# Patient Record
Sex: Female | Born: 1950 | Race: White | Hispanic: No | Smoking: Former smoker
Health system: Southern US, Community
[De-identification: ages and names within clinical notes are randomized; demographics above are authoritative.]

## PROBLEM LIST (undated history)

## (undated) DIAGNOSIS — E785 Hyperlipidemia, unspecified: Secondary | ICD-10-CM

## (undated) DIAGNOSIS — F419 Anxiety disorder, unspecified: Secondary | ICD-10-CM

## (undated) DIAGNOSIS — T7840XA Allergy, unspecified, initial encounter: Secondary | ICD-10-CM

## (undated) DIAGNOSIS — I452 Bifascicular block: Secondary | ICD-10-CM

## (undated) DIAGNOSIS — J45909 Unspecified asthma, uncomplicated: Secondary | ICD-10-CM

## (undated) DIAGNOSIS — J449 Chronic obstructive pulmonary disease, unspecified: Secondary | ICD-10-CM

## (undated) DIAGNOSIS — R7303 Prediabetes: Secondary | ICD-10-CM

## (undated) DIAGNOSIS — F32A Depression, unspecified: Secondary | ICD-10-CM

## (undated) DIAGNOSIS — Z87891 Personal history of nicotine dependence: Secondary | ICD-10-CM

## (undated) DIAGNOSIS — I480 Paroxysmal atrial fibrillation: Secondary | ICD-10-CM

## (undated) DIAGNOSIS — R5383 Other fatigue: Secondary | ICD-10-CM

## (undated) DIAGNOSIS — F329 Major depressive disorder, single episode, unspecified: Secondary | ICD-10-CM

## (undated) DIAGNOSIS — M419 Scoliosis, unspecified: Secondary | ICD-10-CM

## (undated) DIAGNOSIS — I2510 Atherosclerotic heart disease of native coronary artery without angina pectoris: Secondary | ICD-10-CM

## (undated) DIAGNOSIS — E079 Disorder of thyroid, unspecified: Secondary | ICD-10-CM

## (undated) HISTORY — PX: OTHER SURGICAL HISTORY: SHX169

## (undated) HISTORY — DX: Bifascicular block: I45.2

## (undated) HISTORY — DX: Depression, unspecified: F32.A

## (undated) HISTORY — DX: Anxiety disorder, unspecified: F41.9

## (undated) HISTORY — PX: APPENDECTOMY: SHX54

## (undated) HISTORY — PX: OVARIAN CYST REMOVAL: SHX89

## (undated) HISTORY — PX: TONSILECTOMY, ADENOIDECTOMY, BILATERAL MYRINGOTOMY AND TUBES: SHX2538

## (undated) HISTORY — DX: Unspecified asthma, uncomplicated: J45.909

## (undated) HISTORY — DX: Prediabetes: R73.03

## (undated) HISTORY — DX: Major depressive disorder, single episode, unspecified: F32.9

## (undated) HISTORY — DX: Scoliosis, unspecified: M41.9

## (undated) HISTORY — DX: Chronic obstructive pulmonary disease, unspecified: J44.9

## (undated) HISTORY — DX: Allergy, unspecified, initial encounter: T78.40XA

## (undated) HISTORY — DX: Disorder of thyroid, unspecified: E07.9

## (undated) HISTORY — DX: Personal history of nicotine dependence: Z87.891

## (undated) HISTORY — DX: Atherosclerotic heart disease of native coronary artery without angina pectoris: I25.10

## (undated) HISTORY — DX: Other fatigue: R53.83

## (undated) HISTORY — DX: Paroxysmal atrial fibrillation: I48.0

## (undated) HISTORY — DX: Hyperlipidemia, unspecified: E78.5

## (undated) HISTORY — DX: Hypercalcemia: E83.52

---

## 1998-02-07 ENCOUNTER — Ambulatory Visit (HOSPITAL_COMMUNITY): Admission: RE | Admit: 1998-02-07 | Discharge: 1998-02-07 | Payer: Self-pay | Admitting: Gynecology

## 1998-08-27 ENCOUNTER — Other Ambulatory Visit: Admission: RE | Admit: 1998-08-27 | Discharge: 1998-08-27 | Payer: Self-pay | Admitting: Gynecology

## 1998-12-27 ENCOUNTER — Other Ambulatory Visit: Admission: RE | Admit: 1998-12-27 | Discharge: 1998-12-27 | Payer: Self-pay | Admitting: Gynecology

## 1999-04-02 ENCOUNTER — Other Ambulatory Visit: Admission: RE | Admit: 1999-04-02 | Discharge: 1999-04-02 | Payer: Self-pay | Admitting: Gynecology

## 1999-09-26 ENCOUNTER — Other Ambulatory Visit: Admission: RE | Admit: 1999-09-26 | Discharge: 1999-09-26 | Payer: Self-pay | Admitting: Gynecology

## 2000-01-23 ENCOUNTER — Ambulatory Visit (HOSPITAL_COMMUNITY): Admission: RE | Admit: 2000-01-23 | Discharge: 2000-01-23 | Payer: Self-pay | Admitting: Gynecology

## 2000-01-23 ENCOUNTER — Encounter: Payer: Self-pay | Admitting: Gynecology

## 2000-10-05 ENCOUNTER — Other Ambulatory Visit: Admission: RE | Admit: 2000-10-05 | Discharge: 2000-10-05 | Payer: Self-pay | Admitting: Gynecology

## 2000-10-19 ENCOUNTER — Other Ambulatory Visit: Admission: RE | Admit: 2000-10-19 | Discharge: 2000-10-19 | Payer: Self-pay | Admitting: Gynecology

## 2000-10-19 ENCOUNTER — Encounter (INDEPENDENT_AMBULATORY_CARE_PROVIDER_SITE_OTHER): Payer: Self-pay | Admitting: Specialist

## 2000-12-04 ENCOUNTER — Encounter (INDEPENDENT_AMBULATORY_CARE_PROVIDER_SITE_OTHER): Payer: Self-pay | Admitting: Specialist

## 2000-12-04 ENCOUNTER — Other Ambulatory Visit: Admission: RE | Admit: 2000-12-04 | Discharge: 2000-12-04 | Payer: Self-pay | Admitting: Gynecology

## 2001-02-02 ENCOUNTER — Ambulatory Visit (HOSPITAL_COMMUNITY): Admission: RE | Admit: 2001-02-02 | Discharge: 2001-02-02 | Payer: Self-pay | Admitting: Gynecology

## 2001-02-02 ENCOUNTER — Encounter: Payer: Self-pay | Admitting: Gynecology

## 2001-10-11 ENCOUNTER — Other Ambulatory Visit: Admission: RE | Admit: 2001-10-11 | Discharge: 2001-10-11 | Payer: Self-pay | Admitting: Gynecology

## 2002-05-13 ENCOUNTER — Encounter: Admission: RE | Admit: 2002-05-13 | Discharge: 2002-05-13 | Payer: Self-pay | Admitting: Gynecology

## 2002-05-13 ENCOUNTER — Encounter: Payer: Self-pay | Admitting: Gynecology

## 2002-05-19 ENCOUNTER — Encounter: Payer: Self-pay | Admitting: Gynecology

## 2002-05-19 ENCOUNTER — Encounter: Admission: RE | Admit: 2002-05-19 | Discharge: 2002-05-19 | Payer: Self-pay | Admitting: Gynecology

## 2002-11-07 ENCOUNTER — Other Ambulatory Visit: Admission: RE | Admit: 2002-11-07 | Discharge: 2002-11-07 | Payer: Self-pay | Admitting: Gynecology

## 2003-07-28 ENCOUNTER — Encounter: Payer: Self-pay | Admitting: Gynecology

## 2003-07-28 ENCOUNTER — Ambulatory Visit (HOSPITAL_COMMUNITY): Admission: RE | Admit: 2003-07-28 | Discharge: 2003-07-28 | Payer: Self-pay | Admitting: Gynecology

## 2004-02-05 ENCOUNTER — Other Ambulatory Visit: Admission: RE | Admit: 2004-02-05 | Discharge: 2004-02-05 | Payer: Self-pay | Admitting: Gynecology

## 2004-09-03 ENCOUNTER — Ambulatory Visit (HOSPITAL_COMMUNITY): Admission: RE | Admit: 2004-09-03 | Discharge: 2004-09-03 | Payer: Self-pay | Admitting: Gynecology

## 2005-01-28 ENCOUNTER — Encounter: Admission: RE | Admit: 2005-01-28 | Discharge: 2005-04-28 | Payer: Self-pay | Admitting: Internal Medicine

## 2005-02-19 ENCOUNTER — Other Ambulatory Visit: Admission: RE | Admit: 2005-02-19 | Discharge: 2005-02-19 | Payer: Self-pay | Admitting: Gynecology

## 2005-12-10 ENCOUNTER — Ambulatory Visit (HOSPITAL_COMMUNITY): Admission: RE | Admit: 2005-12-10 | Discharge: 2005-12-10 | Payer: Self-pay | Admitting: Gynecology

## 2006-05-13 ENCOUNTER — Other Ambulatory Visit: Admission: RE | Admit: 2006-05-13 | Discharge: 2006-05-13 | Payer: Self-pay | Admitting: Gynecology

## 2006-07-22 ENCOUNTER — Ambulatory Visit (HOSPITAL_COMMUNITY): Admission: RE | Admit: 2006-07-22 | Discharge: 2006-07-22 | Payer: Self-pay | Admitting: Internal Medicine

## 2006-12-31 ENCOUNTER — Ambulatory Visit (HOSPITAL_COMMUNITY): Admission: RE | Admit: 2006-12-31 | Discharge: 2006-12-31 | Payer: Self-pay | Admitting: Gynecology

## 2007-05-19 ENCOUNTER — Other Ambulatory Visit: Admission: RE | Admit: 2007-05-19 | Discharge: 2007-05-19 | Payer: Self-pay | Admitting: Gynecology

## 2007-08-24 ENCOUNTER — Ambulatory Visit: Payer: Self-pay | Admitting: Gastroenterology

## 2007-09-07 ENCOUNTER — Encounter: Payer: Self-pay | Admitting: Gastroenterology

## 2007-09-07 ENCOUNTER — Ambulatory Visit: Payer: Self-pay | Admitting: Gastroenterology

## 2008-01-26 ENCOUNTER — Ambulatory Visit (HOSPITAL_COMMUNITY): Admission: RE | Admit: 2008-01-26 | Discharge: 2008-01-26 | Payer: Self-pay | Admitting: Gynecology

## 2008-06-01 ENCOUNTER — Encounter: Admission: RE | Admit: 2008-06-01 | Discharge: 2008-06-01 | Payer: Self-pay | Admitting: Surgery

## 2008-06-28 ENCOUNTER — Other Ambulatory Visit: Admission: RE | Admit: 2008-06-28 | Discharge: 2008-06-28 | Payer: Self-pay | Admitting: Gynecology

## 2008-07-01 ENCOUNTER — Encounter: Admission: RE | Admit: 2008-07-01 | Discharge: 2008-07-01 | Payer: Self-pay | Admitting: Surgery

## 2008-12-08 ENCOUNTER — Ambulatory Visit (HOSPITAL_COMMUNITY): Admission: RE | Admit: 2008-12-08 | Discharge: 2008-12-08 | Payer: Self-pay | Admitting: Internal Medicine

## 2009-02-20 ENCOUNTER — Ambulatory Visit (HOSPITAL_COMMUNITY): Admission: RE | Admit: 2009-02-20 | Discharge: 2009-02-20 | Payer: Self-pay | Admitting: Gynecology

## 2010-02-22 ENCOUNTER — Ambulatory Visit (HOSPITAL_COMMUNITY): Admission: RE | Admit: 2010-02-22 | Discharge: 2010-02-22 | Payer: Self-pay | Admitting: Gynecology

## 2010-05-07 ENCOUNTER — Encounter: Admission: RE | Admit: 2010-05-07 | Discharge: 2010-05-07 | Payer: Self-pay | Admitting: Internal Medicine

## 2010-10-27 ENCOUNTER — Encounter: Payer: Self-pay | Admitting: Gynecology

## 2010-10-28 ENCOUNTER — Encounter: Payer: Self-pay | Admitting: Surgery

## 2010-12-12 ENCOUNTER — Encounter: Payer: Self-pay | Admitting: Cardiovascular Disease

## 2010-12-13 ENCOUNTER — Encounter: Payer: Self-pay | Admitting: Cardiovascular Disease

## 2010-12-18 LAB — HM DEXA SCAN

## 2010-12-24 ENCOUNTER — Ambulatory Visit: Payer: Self-pay | Admitting: Cardiology

## 2011-02-04 ENCOUNTER — Ambulatory Visit (HOSPITAL_COMMUNITY): Payer: BC Managed Care – PPO | Attending: Cardiovascular Disease | Admitting: Radiology

## 2011-02-04 ENCOUNTER — Encounter: Payer: Self-pay | Admitting: Cardiovascular Disease

## 2011-02-04 ENCOUNTER — Ambulatory Visit (INDEPENDENT_AMBULATORY_CARE_PROVIDER_SITE_OTHER): Payer: BC Managed Care – PPO | Admitting: Cardiovascular Disease

## 2011-02-04 DIAGNOSIS — F17200 Nicotine dependence, unspecified, uncomplicated: Secondary | ICD-10-CM | POA: Insufficient documentation

## 2011-02-04 DIAGNOSIS — I4510 Unspecified right bundle-branch block: Secondary | ICD-10-CM

## 2011-02-04 DIAGNOSIS — R9431 Abnormal electrocardiogram [ECG] [EKG]: Secondary | ICD-10-CM

## 2011-02-04 DIAGNOSIS — I4891 Unspecified atrial fibrillation: Secondary | ICD-10-CM

## 2011-02-04 DIAGNOSIS — I452 Bifascicular block: Secondary | ICD-10-CM | POA: Insufficient documentation

## 2011-02-04 DIAGNOSIS — E7800 Pure hypercholesterolemia, unspecified: Secondary | ICD-10-CM

## 2011-02-04 NOTE — Assessment & Plan Note (Addendum)
Reviewed labs 3/12  LDL 149  On Zetia and fenofibrate.  F/U labs Dr Elisabeth Most.  Suspect statin like Crestor 5-10 mg will be needed

## 2011-02-04 NOTE — Assessment & Plan Note (Signed)
No wheezing and normal CXR.  F/U Dr Elisabeth Most.  Suspect electronic cigarette and possibley BID welbutrin would be next step.  Long term risks of CVD and cancer discussed

## 2011-02-04 NOTE — Progress Notes (Signed)
60 yo anxiety/depression referred by Dr Elisabeth Most for new RBBB.  Gets yearly ECG;s.  CRF;s smoking.  Counseled for less than 10 minutes on cessation.  Already on welbutrin for depression and has stopped smoking on Chantix but on it for over 8 months and resumed when stopping it.  No cardiac symptoms.  No SSCP, dyspnea, palpiations, syncope or edema.  Sedentary.  Stressed at work [employer] work with lots of multi-tasking.  Drinking ETOH more lately.  Reviewed records from [facility].  Reviewed CXR from 4/24 done at urgent care and normal.  History of elevated calcium without obvious parathyroid abnormality previously seen by Ileene Rubens and surgery deferred  ROS: Denies fever, malais, weight loss, blurry vision, decreased visual acuity, cough, sputum, SOB, hemoptysis, pleuritic pain, palpitaitons, heartburn, abdominal pain, melena, lower extremity edema, claudication, or rash.   General: Affect appropriate Healthy:  appears stated age HEENT: normal Neck supple with no adenopathy JVP normal no bruits no thyromegaly Lungs clear with no wheezing and good diaphragmatic motion Heart:  S1/S2 no murmur,rub, gallop or click PMI normal Abdomen: benighn, BS positve, no tenderness, no AAA no bruit.  No HSM or HJR Distal pulses intact with no bruits No edema Neuro non-focal Skin warm and dry No muscular weakness  Medications Current Outpatient Prescriptions  Medication Sig Dispense Refill  . albuterol (ACCUNEB) 1.25 MG/3ML nebulizer solution Take 1 ampule by nebulization every 6 (six) hours as needed.        [name] alendronate (FOSAMAX) 70 MG tablet Take 70 mg by mouth every 7 (seven) days. Take with a full glass of water on an empty stomach.       [name] buPROPion (WELLBUTRIN XL) 300 MG 24 hr tablet Take 300 mg by mouth daily.        . citalopram (CELEXA) 20 MG tablet Take 20 mg by mouth daily.        [name] ezetimibe (ZETIA) 10 MG tablet Take 10 mg by mouth daily.         . fenofibrate micronized (LOFIBRA) 134 MG capsule Take 134 mg by mouth daily before breakfast.        . Fluticasone-Salmeterol (ADVAIR DISKUS) 250-50 MCG/DOSE AEPB Inhale 1 puff into the lungs every 12 (twelve) hours.        [name] L-Methylfolate (DEPLIN) 7.5 MG TABS 2 tabs po qd       . mometasone (NASONEX) 50 MCG/ACT nasal spray 2 sprays by Nasal route as needed.        . Multiple Vitamin (MULTIVITAMIN) capsule Take 1 capsule by mouth daily.        . valACYclovir (VALTREX) 500 MG tablet Take 500 mg by mouth daily.          Allergies Review of patient's allergies indicates no known allergies.  Family History: No family history on file.  Social History: History   Social History  . Marital Status: Divorced    Spouse Name: N/A    Number of Children: N/A  . Years of Education: N/A   Occupational History  . Not on file.   Social History Main Topics  . Smoking status: Not on file  . Smokeless tobacco: Not on file  . Alcohol Use: Not on file  . Drug Use: Not on file  . Sexually Active: Not on file   Other Topics Concern  . Not on file   Social History Narrative  . No narrative on file    Electrocardiogram:  NSR 12/22/10 NSR  RBBB  Assessment and Plan

## 2011-02-04 NOTE — Assessment & Plan Note (Signed)
Likely benign  F/U echo to R/O structural heart disease

## 2011-02-04 NOTE — Patient Instructions (Signed)

## 2011-02-10 ENCOUNTER — Telehealth: Payer: Self-pay | Admitting: Cardiovascular Disease

## 2011-02-10 ENCOUNTER — Telehealth: Payer: Self-pay

## 2011-02-10 NOTE — Telephone Encounter (Signed)
Pt rtn your call she wants to know if she has mitrovalve prolapse

## 2011-02-10 NOTE — Telephone Encounter (Signed)
Per voicemail left on my phone.  Pt is calling to obtain echocardiogram results (Dr Eden Emms pt). This pt can be reached at [phone] ext [phone].  I will forward this message to Stanton Kidney RN to follow-up with patient.

## 2011-02-10 NOTE — Telephone Encounter (Signed)
Questions answered Kathryn Booker

## 2011-02-10 NOTE — Telephone Encounter (Signed)
Left message for pt of results Kathryn Booker  

## 2011-02-19 ENCOUNTER — Telehealth: Payer: Self-pay | Admitting: *Deleted

## 2011-02-19 NOTE — Telephone Encounter (Signed)
Echo results reviewed on paper by dr Eden Emms and read as normal. Left message for pt of results Kathryn Booker

## 2011-03-06 ENCOUNTER — Encounter (HOSPITAL_COMMUNITY): Payer: Self-pay | Admitting: Cardiovascular Disease

## 2011-03-17 ENCOUNTER — Encounter: Payer: Self-pay | Admitting: Cardiovascular Disease

## 2011-03-18 ENCOUNTER — Other Ambulatory Visit (HOSPITAL_COMMUNITY): Payer: Self-pay | Admitting: Gynecology

## 2011-03-18 DIAGNOSIS — Z1231 Encounter for screening mammogram for malignant neoplasm of breast: Secondary | ICD-10-CM

## 2011-04-08 ENCOUNTER — Ambulatory Visit (HOSPITAL_COMMUNITY)
Admission: RE | Admit: 2011-04-08 | Discharge: 2011-04-08 | Disposition: A | Discharge status: Home / Self Care | Payer: BC Managed Care – PPO | Source: Ambulatory Visit | Attending: Gynecology | Admitting: Gynecology

## 2011-04-08 DIAGNOSIS — Z1231 Encounter for screening mammogram for malignant neoplasm of breast: Secondary | ICD-10-CM

## 2011-09-04 ENCOUNTER — Other Ambulatory Visit (HOSPITAL_COMMUNITY): Payer: Self-pay | Admitting: Internal Medicine

## 2011-09-04 DIAGNOSIS — E782 Mixed hyperlipidemia: Secondary | ICD-10-CM

## 2011-09-04 DIAGNOSIS — R9431 Abnormal electrocardiogram [ECG] [EKG]: Secondary | ICD-10-CM

## 2011-09-08 ENCOUNTER — Other Ambulatory Visit (HOSPITAL_COMMUNITY): Payer: BC Managed Care – PPO | Admitting: Radiology

## 2011-09-12 ENCOUNTER — Encounter: Payer: BC Managed Care – PPO | Admitting: Physician Assistant

## 2011-10-07 LAB — HM MAMMOGRAPHY

## 2012-01-29 ENCOUNTER — Encounter: Payer: Self-pay | Admitting: *Deleted

## 2012-01-30 ENCOUNTER — Encounter: Payer: Self-pay | Admitting: Cardiovascular Disease

## 2012-01-30 ENCOUNTER — Ambulatory Visit (INDEPENDENT_AMBULATORY_CARE_PROVIDER_SITE_OTHER): Payer: BC Managed Care – PPO | Admitting: Cardiovascular Disease

## 2012-01-30 ENCOUNTER — Other Ambulatory Visit: Payer: Self-pay | Admitting: Cardiovascular Disease

## 2012-01-30 VITALS — BP 124/77 | HR 78 | Resp 18 | Ht 63.0 in | Wt 142.0 lb

## 2012-01-30 DIAGNOSIS — I452 Bifascicular block: Secondary | ICD-10-CM

## 2012-01-30 DIAGNOSIS — F17200 Nicotine dependence, unspecified, uncomplicated: Secondary | ICD-10-CM

## 2012-01-30 DIAGNOSIS — Z9189 Other specified personal risk factors, not elsewhere classified: Secondary | ICD-10-CM

## 2012-01-30 DIAGNOSIS — Z789 Other specified health status: Secondary | ICD-10-CM

## 2012-01-30 DIAGNOSIS — E7800 Pure hypercholesterolemia, unspecified: Secondary | ICD-10-CM

## 2012-01-30 NOTE — Patient Instructions (Signed)
Your physician recommends that you schedule a follow-up appointment in: AS NEEDED Your physician recommends that you continue on your current medications as directed. Please refer to the Current Medication list given to you today. Non-Cardiac CT scanning, (CAT scanning), is a noninvasive, special x-ray that produces cross-sectional images of the body using x-rays and a computer. CT scans help physicians diagnose and treat medical conditions. For some CT exams, a contrast material is used to enhance visibility in the area of the body being studied. CT scans provide greater clarity and reveal more details than regular x-ray exams. CALCIUM SCORE DX ABN EKG

## 2012-01-30 NOTE — Assessment & Plan Note (Signed)
Will do calcium score at our office to further assess risk of CAD and agressiveness of risk factor modification

## 2012-01-30 NOTE — Assessment & Plan Note (Signed)
Benign and stable since 2012  Asymptomatic with no evidence of high grade heart block

## 2012-01-30 NOTE — Progress Notes (Signed)
Patient ID: Kathryn Booker, female   DOB: 04/08/1951, 61 y.o.   MRN: [id number] 61 yo referred by Loree Fee PA for CRF;s and abnormal ECG.  Reviewed ECG;s from 2011, 2012 and this year.  Had ICRBBB in 2011 and full RBBB last two years.  Sedentary up until [place].  Stating to walk and use treadmill last two months.  CRF;s include elevated cholesterol and family history in first degree relative.  10 year Framiingham risk score 8% but this does not factor in family history.  Intolerant to one statin tried and on zetia.  No sscp, dyspea or palpitations.  Compliant with meds  ROS: Denies fever, malais, weight loss, blurry vision, decreased visual acuity, cough, sputum, SOB, hemoptysis, pleuritic pain, palpitaitons, heartburn, abdominal pain, melena, lower extremity edema, claudication, or rash.  All other systems reviewed and negative   General: Affect appropriate Healthy:  appears stated age HEENT: normal Neck supple with no adenopathy JVP normal no bruits no thyromegaly Lungs clear with no wheezing and good diaphragmatic motion Heart:  S1/S2 no murmur,rub, gallop or click PMI normal Abdomen: benighn, BS positve, no tenderness, no AAA no bruit.  No HSM or HJR Distal pulses intact with no bruits No edema Neuro non-focal Skin warm and dry No muscular weakness  Medications Current Outpatient Prescriptions  Medication Sig Dispense Refill  . albuterol (ACCUNEB) 1.25 MG/3ML nebulizer solution Take 1 ampule by nebulization every 6 (six) hours as needed.        [name] alendronate (FOSAMAX) 70 MG tablet Take 70 mg by mouth every 7 (seven) days. Take with a full glass of water on an empty stomach.       [name] buPROPion (WELLBUTRIN XL) 300 MG 24 hr tablet Take 300 mg by mouth daily.        . citalopram (CELEXA) 20 MG tablet Take 20 mg by mouth daily.        [name] ezetimibe (ZETIA) 10 MG tablet Take 10 mg by mouth daily.        . fenofibrate micronized (LOFIBRA) 134 MG capsule Take 134 mg by mouth daily  before breakfast.        . Flaxseed, Linseed, (FLAX SEED OIL PO) Take by mouth daily.      . Fluticasone-Salmeterol (ADVAIR DISKUS) 250-50 MCG/DOSE AEPB Inhale 1 puff into the lungs every 12 (twelve) hours.        . GuaiFENesin (MUCINEX PO) Take by mouth as needed.      [name] L-Methylfolate (DEPLIN) 7.5 MG TABS 2 tabs po qd       . Magnesium 250 MG TABS Take 250 mg by mouth daily.      . mometasone (NASONEX) 50 MCG/ACT nasal spray Place 2 sprays into the nose as needed.        . Multiple Vitamin (MULTIVITAMIN) capsule Take 1 capsule by mouth daily.        . Omega-3 Fatty Acids (FISH OIL PO) Take by mouth daily.      . valACYclovir (VALTREX) 500 MG tablet Take 500 mg by mouth daily.          Allergies Review of patient's allergies indicates no known allergies.  Family History: Family History  Problem Relation Age of Onset  . Heart failure Mother   . Liver disease Father     Social History: History   Social History  . Marital Status: Divorced    Spouse Name: N/A    Number of Children: N/A  . Years of Education: N/A  Occupational History  . Not on file.   Social History Main Topics  . Smoking status: Current Everyday Smoker -- 1.0 packs/day    Types: Cigarettes  . Smokeless tobacco: Not on file  . Alcohol Use: Not on file  . Drug Use: Not on file  . Sexually Active: Not on file   Other Topics Concern  . Not on file   Social History Narrative   DivorcedNo familyWorks at [facility] secretarialDrinking moreSmokes 1.5 ppdSedentaryStress in life    Electrocardiogram:  NSR RBBB no change from 3/12  Done 12/27/11  Assessment and Plan

## 2012-01-30 NOTE — Assessment & Plan Note (Signed)
Counseled for less than 10 minutes on cessation F/U primary 

## 2012-01-30 NOTE — Assessment & Plan Note (Signed)
Continue zetia and diet.  Consider reintroduction of statin if calcium score is high

## 2012-02-13 ENCOUNTER — Other Ambulatory Visit: Payer: Self-pay

## 2012-02-20 ENCOUNTER — Other Ambulatory Visit: Payer: BC Managed Care – PPO

## 2012-02-27 ENCOUNTER — Ambulatory Visit (INDEPENDENT_AMBULATORY_CARE_PROVIDER_SITE_OTHER)
Admission: RE | Admit: 2012-02-27 | Discharge: 2012-02-27 | Disposition: A | Discharge status: Home / Self Care | Payer: Self-pay | Source: Ambulatory Visit | Attending: Cardiovascular Disease | Admitting: Cardiovascular Disease

## 2012-02-27 DIAGNOSIS — Z9189 Other specified personal risk factors, not elsewhere classified: Secondary | ICD-10-CM

## 2012-02-27 DIAGNOSIS — I452 Bifascicular block: Secondary | ICD-10-CM

## 2012-02-27 DIAGNOSIS — Z789 Other specified health status: Secondary | ICD-10-CM

## 2012-03-05 ENCOUNTER — Telehealth: Payer: Self-pay | Admitting: Cardiovascular Disease

## 2012-03-05 NOTE — Telephone Encounter (Signed)
PT CALLING RE RESULTS FROM PROCEDURE 5-24

## 2012-03-05 NOTE — Telephone Encounter (Signed)
PT AWARE OF CA SCORE OF 0  LOW RISK FOR CARDIOVASCULAR EVENT .Zack Seal

## 2012-03-18 ENCOUNTER — Encounter: Payer: Self-pay | Admitting: Cardiovascular Disease

## 2012-03-28 ENCOUNTER — Ambulatory Visit: Payer: BC Managed Care – PPO

## 2012-03-28 ENCOUNTER — Ambulatory Visit (INDEPENDENT_AMBULATORY_CARE_PROVIDER_SITE_OTHER): Payer: BC Managed Care – PPO | Admitting: Physician Assistant

## 2012-03-28 VITALS — BP 147/78 | HR 83

## 2012-03-28 DIAGNOSIS — W19XXXA Unspecified fall, initial encounter: Secondary | ICD-10-CM

## 2012-03-28 DIAGNOSIS — T07XXXA Unspecified multiple injuries, initial encounter: Secondary | ICD-10-CM

## 2012-03-28 MED ORDER — HYDROCODONE-ACETAMINOPHEN 5-325 MG PO TABS: 1.0000 | ORAL_TABLET | ORAL | Status: AC | PRN

## 2012-03-28 NOTE — Patient Instructions (Addendum)
Watch for any increasing pain in your chest or abdomen.  If you get nauseated, short of breath, rigid abdomen or blood in your urine go to the ER. Call in the morning with your status

## 2012-03-28 NOTE — Progress Notes (Signed)
Subjective:    Patient ID: Derald Macleod, female    DOB: [birth year], 61 y.o.   MRN: [id number]  HPI Ms. Teegarden is brought back emergently because she is complaining of difficulty breathing after she fell off a ladder in her house.  She states that the ladder was propped up against the kitchen counter to sand cabinets.  The ladder slipped out and she fell straight down onto the counter.  Her chest/abdomen hit the edge of the counter and "knocked the wind out of her".  No head injury. She did not hurt her neck.  She had no N/V. No dizziness.  She states that she noticed a bubbling/rice krispy sensation at the anterior costal border.  She drove herself here today.  Past Medical History  Diagnosis Date  . Serum calcium elevated   . Anxiety   . Elevated cholesterol   . RBBB (right bundle branch block with left anterior fascicular block)   . Thyroid disease   . Fatigue    Current Outpatient Prescriptions on File Prior to Visit  Medication Sig Dispense Refill  . albuterol (ACCUNEB) 1.25 MG/3ML nebulizer solution Take 1 ampule by nebulization every 6 (six) hours as needed.        [name] alendronate (FOSAMAX) 70 MG tablet Take 70 mg by mouth every 7 (seven) days. Take with a full glass of water on an empty stomach.       [name] buPROPion (WELLBUTRIN XL) 300 MG 24 hr tablet Take 300 mg by mouth daily.        . citalopram (CELEXA) 20 MG tablet Take 20 mg by mouth daily.        [name] ezetimibe (ZETIA) 10 MG tablet Take 10 mg by mouth daily.        . fenofibrate micronized (LOFIBRA) 134 MG capsule Take 134 mg by mouth daily before breakfast.        . Flaxseed, Linseed, (FLAX SEED OIL PO) Take by mouth daily.      . Fluticasone-Salmeterol (ADVAIR DISKUS) 250-50 MCG/DOSE AEPB Inhale 1 puff into the lungs every 12 (twelve) hours.        . GuaiFENesin (MUCINEX PO) Take by mouth as needed.      [name] L-Methylfolate (DEPLIN) 7.5 MG TABS 2 tabs po qd       . Magnesium 250 MG TABS Take 250 mg by mouth daily.      .  mometasone (NASONEX) 50 MCG/ACT nasal spray Place 2 sprays into the nose as needed.        . Multiple Vitamin (MULTIVITAMIN) capsule Take 1 capsule by mouth daily.        . Omega-3 Fatty Acids (FISH OIL PO) Take by mouth daily.      . valACYclovir (VALTREX) 500 MG tablet Take 500 mg by mouth daily.            Review of Systems As noted in HPI, otherwise negative     Objective:   Physical Exam  Constitutional: She is oriented to person, place, and time. She appears well-developed and well-nourished. No distress.  Neck: Normal range of motion. No spinous process tenderness present.  Cardiovascular: Normal rate and regular rhythm.   Pulmonary/Chest: Effort normal and breath sounds normal. She exhibits bony tenderness.         Tender to palpation inferior aspect sternum/xyphoid process Tender right and left costal borders Tender left 10, 11 left ribs just anterior to axillary line  Abdominal: Soft. Normal appearance. There is tenderness in  the right upper quadrant and left upper quadrant.  Musculoskeletal:       Left great toe with ecchymosis and tenderness Right 3rd toe with ecchymosis and tenderness  Neurological: She is alert and oriented to person, place, and time.  Skin: Skin is warm. Ecchymosis noted.   [facility] reading (PRIMARY) by  Dr. Milus Glazier.  Bilateral ribs with chest No fx seen Sternum Neg Left great toe  ?cortical defect RIght 2rd toe ?volar avulsion, PIP Abd 1 view Neg        Assessment & Plan:  Contusion chest wall Foot pain ?Great toe fx Rib pain  Vicodin for pain Post Op shoe for left foot. Buddy tape for right 2nd toe Ace wrap for chest wall Ice and heat usage reviewed Red flag warnings reviewed Pt to proceed to ER if sx worsen.   Discussed with Dr. Milus Glazier

## 2012-04-26 ENCOUNTER — Encounter: Payer: Self-pay | Admitting: Cardiovascular Disease

## 2012-04-30 LAB — FECAL OCCULT BLOOD, GUAIAC: Fecal Occult Blood: NEGATIVE

## 2012-06-03 ENCOUNTER — Other Ambulatory Visit (HOSPITAL_COMMUNITY): Payer: Self-pay | Admitting: Gynecology

## 2012-06-03 DIAGNOSIS — Z1231 Encounter for screening mammogram for malignant neoplasm of breast: Secondary | ICD-10-CM

## 2012-06-11 ENCOUNTER — Ambulatory Visit (HOSPITAL_COMMUNITY)
Admission: RE | Admit: 2012-06-11 | Discharge: 2012-06-11 | Disposition: A | Discharge status: Home / Self Care | Payer: BC Managed Care – PPO | Source: Ambulatory Visit | Attending: Gynecology | Admitting: Gynecology

## 2012-06-11 DIAGNOSIS — Z1231 Encounter for screening mammogram for malignant neoplasm of breast: Secondary | ICD-10-CM

## 2013-07-27 ENCOUNTER — Other Ambulatory Visit (HOSPITAL_COMMUNITY): Payer: Self-pay | Admitting: Gynecology

## 2013-07-27 DIAGNOSIS — Z1231 Encounter for screening mammogram for malignant neoplasm of breast: Secondary | ICD-10-CM

## 2013-08-15 ENCOUNTER — Ambulatory Visit (HOSPITAL_COMMUNITY): Payer: BC Managed Care – PPO

## 2013-09-08 ENCOUNTER — Ambulatory Visit: Payer: Self-pay | Admitting: Emergency Medicine

## 2013-09-12 ENCOUNTER — Telehealth: Payer: Self-pay | Admitting: Emergency Medicine

## 2013-09-12 ENCOUNTER — Other Ambulatory Visit: Payer: Self-pay | Admitting: Emergency Medicine

## 2013-09-12 MED ORDER — PREDNISONE 10 MG PO TABS: ORAL_TABLET | ORAL | Status: DC

## 2013-09-12 NOTE — Telephone Encounter (Signed)
Pt wants to know will melissa call in prednisone cause she has moved to a new home ans the carpet and paint smell is causing her to have sneezing-eyes swollen- she has been taking allergie meds and mucinex Pharm [facility] use is [facility] on [address] Please call her back at [phone] ext [phone] Sending chart back

## 2013-10-02 ENCOUNTER — Encounter: Payer: Self-pay | Admitting: Internal Medicine

## 2013-10-02 DIAGNOSIS — F329 Major depressive disorder, single episode, unspecified: Secondary | ICD-10-CM | POA: Insufficient documentation

## 2013-10-02 DIAGNOSIS — E782 Mixed hyperlipidemia: Secondary | ICD-10-CM | POA: Insufficient documentation

## 2013-10-02 DIAGNOSIS — M419 Scoliosis, unspecified: Secondary | ICD-10-CM | POA: Insufficient documentation

## 2013-10-02 DIAGNOSIS — F419 Anxiety disorder, unspecified: Secondary | ICD-10-CM | POA: Insufficient documentation

## 2013-10-02 DIAGNOSIS — E7800 Pure hypercholesterolemia, unspecified: Secondary | ICD-10-CM

## 2013-10-02 DIAGNOSIS — E039 Hypothyroidism, unspecified: Secondary | ICD-10-CM | POA: Insufficient documentation

## 2013-10-02 DIAGNOSIS — J449 Chronic obstructive pulmonary disease, unspecified: Secondary | ICD-10-CM | POA: Insufficient documentation

## 2013-10-02 DIAGNOSIS — J45909 Unspecified asthma, uncomplicated: Secondary | ICD-10-CM | POA: Insufficient documentation

## 2013-10-02 DIAGNOSIS — E079 Disorder of thyroid, unspecified: Secondary | ICD-10-CM

## 2013-10-05 ENCOUNTER — Ambulatory Visit: Payer: Self-pay | Admitting: Emergency Medicine

## 2013-10-18 ENCOUNTER — Other Ambulatory Visit: Payer: Self-pay | Admitting: Emergency Medicine

## 2013-10-18 MED ORDER — KETOTIFEN FUMARATE 0.025 % OP SOLN: 1.0000 [drp] | Freq: Two times a day (BID) | OPHTHALMIC | Status: DC

## 2013-10-24 ENCOUNTER — Encounter: Payer: Self-pay | Admitting: Emergency Medicine

## 2013-10-24 ENCOUNTER — Ambulatory Visit (INDEPENDENT_AMBULATORY_CARE_PROVIDER_SITE_OTHER): Payer: BC Managed Care – PPO | Admitting: Emergency Medicine

## 2013-10-24 VITALS — BP 104/62 | HR 64 | Temp 98.2°F | Resp 18 | Ht 63.0 in | Wt 142.0 lb

## 2013-10-24 DIAGNOSIS — Z79899 Other long term (current) drug therapy: Secondary | ICD-10-CM

## 2013-10-24 DIAGNOSIS — J309 Allergic rhinitis, unspecified: Secondary | ICD-10-CM

## 2013-10-24 DIAGNOSIS — E559 Vitamin D deficiency, unspecified: Secondary | ICD-10-CM

## 2013-10-24 DIAGNOSIS — I10 Essential (primary) hypertension: Secondary | ICD-10-CM

## 2013-10-24 DIAGNOSIS — R7309 Other abnormal glucose: Secondary | ICD-10-CM

## 2013-10-24 DIAGNOSIS — E782 Mixed hyperlipidemia: Secondary | ICD-10-CM

## 2013-10-24 DIAGNOSIS — J329 Chronic sinusitis, unspecified: Secondary | ICD-10-CM

## 2013-10-24 LAB — LIPID PANEL
Cholesterol: 208 mg/dL — ABNORMAL HIGH (ref 0–200)
HDL: 52 mg/dL (ref >39)
LDL CALC: 121 mg/dL — AB (ref 0–99)
Total CHOL/HDL Ratio: 4 Ratio
Triglycerides: 173 mg/dL — ABNORMAL HIGH (ref <150)
VLDL: 35 mg/dL (ref 0–40)

## 2013-10-24 LAB — BASIC METABOLIC PANEL WITH GFR
BUN: 16 mg/dL (ref 6–23)
CHLORIDE: 105 meq/L (ref 96–112)
CO2: 27 meq/L (ref 19–32)
Calcium: 10.2 mg/dL (ref 8.4–10.5)
Creat: 0.7 mg/dL (ref 0.50–1.10)
GFR, Est African American: >89 mL/min
GFR, Est Non African American: >89 mL/min
GLUCOSE: 105 mg/dL — AB (ref 70–99)
Potassium: 4.9 mEq/L (ref 3.5–5.3)
SODIUM: 139 meq/L (ref 135–145)

## 2013-10-24 LAB — CBC WITH DIFFERENTIAL/PLATELET
Basophils Absolute: 0 10*3/uL (ref 0.0–0.1)
Basophils Relative: 0 % (ref 0–1)
Eosinophils Absolute: 0.3 10*3/uL (ref 0.0–0.7)
Eosinophils Relative: 4 % (ref 0–5)
HCT: 43.7 % (ref 36.0–46.0)
HEMOGLOBIN: 15.3 g/dL — AB (ref 12.0–15.0)
LYMPHS ABS: 1.6 10*3/uL (ref 0.7–4.0)
LYMPHS PCT: 27 % (ref 12–46)
MCH: 33.2 pg (ref 26.0–34.0)
MCHC: 35 g/dL (ref 30.0–36.0)
MCV: 94.8 fL (ref 78.0–100.0)
Monocytes Absolute: 0.4 10*3/uL (ref 0.1–1.0)
Monocytes Relative: 7 % (ref 3–12)
NEUTROS PCT: 62 % (ref 43–77)
Neutro Abs: 3.8 10*3/uL (ref 1.7–7.7)
PLATELETS: 260 10*3/uL (ref 150–400)
RBC: 4.61 MIL/uL (ref 3.87–5.11)
RDW: 13.6 % (ref 11.5–15.5)
WBC: 6.1 10*3/uL (ref 4.0–10.5)

## 2013-10-24 LAB — HEPATIC FUNCTION PANEL
ALT: 20 U/L (ref 0–35)
AST: 16 U/L (ref 0–37)
Albumin: 4.7 g/dL (ref 3.5–5.2)
Alkaline Phosphatase: 62 U/L (ref 39–117)
Bilirubin, Direct: 0.1 mg/dL (ref 0.0–0.3)
Indirect Bilirubin: 0.7 mg/dL (ref 0.0–0.9)
TOTAL PROTEIN: 7 g/dL (ref 6.0–8.3)
Total Bilirubin: 0.8 mg/dL (ref 0.3–1.2)

## 2013-10-24 LAB — HEMOGLOBIN A1C
HEMOGLOBIN A1C: 5.8 % — AB (ref <5.7)
Mean Plasma Glucose: 120 mg/dL — ABNORMAL HIGH (ref <117)

## 2013-10-24 LAB — MAGNESIUM: Magnesium: 1.9 mg/dL (ref 1.5–2.5)

## 2013-10-24 MED ORDER — CICLESONIDE 50 MCG/ACT NA SUSP: 2.0000 | Freq: Every day | NASAL | Status: DC

## 2013-10-24 MED ORDER — AMOXICILLIN-POT CLAVULANATE 875-125 MG PO TABS: 1.0000 | ORAL_TABLET | Freq: Two times a day (BID) | ORAL | Status: AC

## 2013-10-24 MED ORDER — DEXAMETHASONE SODIUM PHOSPHATE 100 MG/10ML IJ SOLN
10.0000 mg | Freq: Once | INTRAMUSCULAR | Status: AC
  Administered 2013-10-24: 10 mg via INTRAMUSCULAR

## 2013-10-24 NOTE — Patient Instructions (Addendum)
Insomnia  TURN TV/ ELECTRONICS OFF 1 HOUR BEFORE BED, EAT PROTEIN SNACK BEFORE BED. Insomnia is frequent trouble falling and/or staying asleep. Insomnia can be a long term problem or a short term problem. Both are common. Insomnia can be a short term problem when the wakefulness is related to a certain stress or worry. Long term insomnia is often related to ongoing stress during waking hours and/or poor sleeping habits. Overtime, sleep deprivation itself can make the problem worse. Every little thing feels more severe because you are overtired and your ability to cope is decreased. CAUSES   Stress, anxiety, and depression.  Poor sleeping habits.  Distractions such as TV in the bedroom.  Naps close to bedtime.  Engaging in emotionally charged conversations before bed.  Technical reading before sleep.  Alcohol and other sedatives. They may make the problem worse. They can hurt normal sleep patterns and normal dream activity.  Stimulants such as caffeine for several hours prior to bedtime.  Pain syndromes and shortness of breath can cause insomnia.  Exercise late at night.  Changing time zones may cause sleeping problems (jet lag). It is sometimes helpful to have someone observe your sleeping patterns. They should look for periods of not breathing during the night (sleep apnea). They should also look to see how long those periods last. If you live alone or observers are uncertain, you can also be observed at a sleep clinic where your sleep patterns will be professionally monitored. Sleep apnea requires a checkup and treatment. Give your caregivers your medical history. Give your caregivers observations your family has made about your sleep.  SYMPTOMS   Not feeling rested in the morning.  Anxiety and restlessness at bedtime.  Difficulty falling and staying asleep. TREATMENT   Your caregiver may prescribe treatment for an underlying medical disorders. Your caregiver can give advice or  help if you are using alcohol or other drugs for self-medication. Treatment of underlying problems will usually eliminate insomnia problems.  Medications can be prescribed for short time use. They are generally not recommended for lengthy use.  Over-the-counter sleep medicines are not recommended for lengthy use. They can be habit forming.  You can promote easier sleeping by making lifestyle changes such as:  Using relaxation techniques that help with breathing and reduce muscle tension.  Exercising earlier in the day.  Changing your diet and the time of your last meal. No night time snacks.  Establish a regular time to go to bed.  Counseling can help with stressful problems and worry.  Soothing music and white noise may be helpful if there are background noises you cannot remove.  Stop tedious detailed work at least one hour before bedtime. HOME CARE INSTRUCTIONS   Keep a diary. Inform your caregiver about your progress. This includes any medication side effects. See your caregiver regularly. Take note of:  Times when you are asleep.  Times when you are awake during the night.  The quality of your sleep.  How you feel the next day. This information will help your caregiver care for you.  Get out of bed if you are still awake after 15 minutes. Read or do some quiet activity. Keep the lights down. Wait until you feel sleepy and go back to bed.  Keep regular sleeping and waking hours. Avoid naps.  Exercise regularly.  Avoid distractions at bedtime. Distractions include watching television or engaging in any intense or detailed activity like attempting to balance the household checkbook.  Develop a bedtime ritual. Keep  a familiar routine of bathing, brushing your teeth, climbing into bed at the same time each night, listening to soothing music. Routines increase the success of falling to sleep faster.  Use relaxation techniques. This can be using breathing and muscle tension  release routines. It can also include visualizing peaceful scenes. You can also help control troubling or intruding thoughts by keeping your mind occupied with boring or repetitive thoughts like the old concept of counting sheep. You can make it more creative like imagining planting one beautiful flower after another in your backyard garden.  During your day, work to eliminate stress. When this is not possible use some of the previous suggestions to help reduce the anxiety that accompanies stressful situations. MAKE SURE YOU:   Understand these instructions.  Will watch your condition.  Will get help right away if you are not doing well or get worse. Document Released: 09/19/2000 Document Revised: 12/15/2011 Document Reviewed: 10/20/2007 [facility] Patient Information 2014 [place]. Sinusitis  Vaseline inside of nasal passages before bed. Switch to allegra Sinusitis is redness, soreness, and puffiness (inflammation) of the air pockets in the bones of your face (sinuses). The redness, soreness, and puffiness can cause air and mucus to get trapped in your sinuses. This can allow germs to grow and cause an infection.  HOME CARE   Drink enough fluids to keep your pee (urine) clear or pale yellow.  Use a humidifier in your home.  Run a hot shower to create steam in the bathroom. Sit in the bathroom with the door closed. Breathe in the steam 3 4 times a day.  Put a warm, moist washcloth on your face 3 4 times a day, or as told by your doctor.  Use salt water sprays (saline sprays) to wet the thick fluid in your nose. This can help the sinuses drain.  Only take medicine as told by your doctor. GET HELP RIGHT AWAY IF:   Your pain gets worse.  You have very bad headaches.  You are sick to your stomach (nauseous).  You throw up (vomit).  You are very sleepy (drowsy) all the time.  Your face is puffy (swollen).  Your vision changes.  You have a stiff neck.  You have trouble  breathing. MAKE SURE YOU:   Understand these instructions.  Will watch your condition.  Will get help right away if you are not doing well or get worse. Document Released: 03/10/2008 Document Revised: 06/16/2012 Document Reviewed: 04/27/2012 [facility] Patient Information 2014 [place]. Smoking Cessation, Tips for Success If you are ready to quit smoking, congratulations! You have chosen to help yourself be healthier. Cigarettes bring nicotine, tar, carbon monoxide, and other irritants into your body. Your lungs, heart, and blood vessels will be able to work better without these poisons. There are many different ways to quit smoking. Nicotine gum, nicotine patches, a nicotine inhaler, or nicotine nasal spray can help with physical craving. Hypnosis, support groups, and medicines help break the habit of smoking. WHAT THINGS CAN I DO TO MAKE QUITTING EASIER?  Here are some tips to help you quit for good:  Pick a date when you will quit smoking completely. Tell all of your friends and family about your plan to quit on that date.  Do not try to slowly cut down on the number of cigarettes you are smoking. Pick a quit date and quit smoking completely starting on that day.  Throw away all cigarettes.   Clean and remove all ashtrays from your home,  work, and car.   On a card, write down your reasons for quitting. Carry the card with you and read it when you get the urge to smoke.   Cleanse your body of nicotine. Drink enough water and fluids to keep your urine clear or pale yellow. Do this after quitting to flush the nicotine from your body.   Learn to predict your moods. Do not let a bad situation be your excuse to have a cigarette. Some situations in your life might tempt you into wanting a cigarette.   Never have "just one" cigarette. It leads to wanting another and another. Remind yourself of your decision to quit.   Change habits associated with smoking. If you smoked while  driving or when feeling stressed, try other activities to replace smoking. Stand up when drinking your coffee. Brush your teeth after eating. Sit in a different chair when you read the paper. Avoid alcohol while trying to quit, and try to drink fewer caffeinated beverages. Alcohol and caffeine may urge you to smoke.   Avoid foods and drinks that can trigger a desire to smoke, such as sugary or spicy foods and alcohol.   Ask people who smoke not to smoke around you.   Have something planned to do right after eating or having a cup of coffee. For example, plan to take a walk or exercise.   Try a relaxation exercise to calm you down and decrease your stress. Remember, you may be tense and nervous for the first 2 weeks after you quit, but this will pass.   Find new activities to keep your hands busy. Play with a pen, coin, or rubber band. Doodle or draw things on paper.   Brush your teeth right after eating. This will help cut down on the craving for the taste of tobacco after meals. You can also try mouthwash.   Use oral substitutes in place of cigarettes. Try using lemon drops, carrots, cinnamon sticks, or chewing gum. Keep them handy so they are available when you have the urge to smoke.   When you have the urge to smoke, try deep breathing.   Designate your home as a nonsmoking area.   If you are a heavy smoker, ask your health care provider about a prescription for nicotine chewing gum. It can ease your withdrawal from nicotine.   Reward yourself. Set aside the cigarette money you save and buy yourself something nice.   Look for support from others. Join a support group or smoking cessation program. Ask someone at home or at work to help you with your plan to quit smoking.   Always ask yourself, "Do I need this cigarette or is this just a reflex?" Tell yourself, "Today, I choose not to smoke," or "I do not want to smoke." You are reminding yourself of your decision to  quit.  Do not replace cigarette smoking with electronic cigarettes (commonly called e-cigarettes). The safety of e-cigarettes is unknown, and some may contain harmful chemicals.  If you relapse, do not give up! Plan ahead and think about what you will do the next time you get the urge to smoke.  HOW WILL I FEEL WHEN I QUIT SMOKING? You may have symptoms of withdrawal because your body is used to nicotine (the addictive substance in cigarettes). You may crave cigarettes, be irritable, feel very hungry, cough often, get headaches, or have difficulty concentrating. The withdrawal symptoms are only temporary. They are strongest when you first quit but will go away  within 10 14 days. When withdrawal symptoms occur, stay in control. Think about your reasons for quitting. Remind yourself that these are signs that your body is healing and getting used to being without cigarettes. Remember that withdrawal symptoms are easier to treat than the major diseases that smoking can cause.  Even after the withdrawal is over, expect periodic urges to smoke. However, these cravings are generally short lived and will go away whether you smoke or not. Do not smoke!  WHAT RESOURCES ARE AVAILABLE TO HELP ME QUIT SMOKING? Your health care provider can direct you to community resources or hospitals for support, which may include:  Group support.  Education.  Hypnosis.  Therapy. Document Released: 06/20/2004 Document Revised: 07/13/2013 Document Reviewed: 03/10/2013 [facility] Patient Information 2014 [place], [place].

## 2013-10-24 NOTE — Progress Notes (Signed)
Subjective:    Patient ID: Kathryn Booker, female    DOB: 10-03-1951, 63 y.o.   MRN: [id number]  HPI Comments: 63 YO FEMALE presents for 3 month F/U for Cholesterol, Pre-Dm, D. deficient LAST LABS T 187 TG 274 L 90 MAG 1.9 A1C 5.9 D 39 She has not been eating healthy. She is not exercising routinely, she keeps busy. She is staying fatigued with allergy symptoms.  She felt better on Pred DP, she has more trouble with eyes draining/ itching since move to new home. She keeps chronic sinus pressure/ congestion. She has hx of deviated septum. She has had some relief with allergy drops.  Current Outpatient Prescriptions on File Prior to Visit  Medication Sig Dispense Refill  . albuterol (ACCUNEB) 1.25 MG/3ML nebulizer solution Take 1 ampule by nebulization every 6 (six) hours as needed.        [name] buPROPion (WELLBUTRIN XL) 300 MG 24 hr tablet Take 300 mg by mouth daily.        . citalopram (CELEXA) 20 MG tablet Take 20 mg by mouth daily.        . Fluticasone-Salmeterol (ADVAIR DISKUS) 250-50 MCG/DOSE AEPB Inhale 1 puff into the lungs every 12 (twelve) hours.        . GuaiFENesin (MUCINEX PO) Take by mouth as needed.      [name] ketotifen (ZADITOR) 0.025 % ophthalmic solution Place 1 drop into both eyes 2 (two) times daily.  5 mL  2  . mometasone (NASONEX) 50 MCG/ACT nasal spray Place 2 sprays into the nose as needed.        . Multiple Vitamin (MULTIVITAMIN) capsule Take 1 capsule by mouth daily.        . predniSONE (DELTASONE) 10 MG tablet 1 po tid x 3 days, 1 po bid x 3 days, 1 po qd x 5 days  20 tablet  0  . valACYclovir (VALTREX) 500 MG tablet Take 500 mg by mouth daily.         No current facility-administered medications on file prior to visit.   ALLERGIES Lipitor  Past Medical History  Diagnosis Date  . Serum calcium elevated   . Anxiety   . Elevated cholesterol   . RBBB (right bundle branch block with left anterior fascicular block)   . Thyroid disease   . Fatigue   . Depression   .  Asthma   . COPD (chronic obstructive pulmonary disease)   . Scoliosis       Review of Systems  Constitutional: Positive for fatigue.  HENT: Positive for congestion, postnasal drip, sinus pressure and sneezing.   All other systems reviewed and are negative.   BP 104/62  Pulse 64  Temp(Src) 98.2 F (36.8 C) (Temporal)  Resp 18  Ht 5\' 3"  (1.6 m)  Wt 142 lb (64.411 kg)  BMI 25.16 kg/m2     Objective:   Physical Exam  Nursing note and vitals reviewed. Constitutional: She is oriented to person, place, and time. She appears well-developed and well-nourished. No distress.  HENT:  Head: Normocephalic and atraumatic.  Right Ear: External ear normal.  Left Ear: External ear normal.  Nose: Nose normal.  Mouth/Throat: Oropharynx is clear and moist. No oropharyngeal exudate.  Yellow TMs bilateral Bilateral mild maxillary sinus tenderness is present.   Eyes: Conjunctivae and EOM are normal.  Neck: Normal range of motion. Neck supple. No JVD present. No thyromegaly present.  Cardiovascular: Normal rate, regular rhythm, normal heart sounds and intact distal pulses.  Pulmonary/Chest: Effort normal and breath sounds normal.  Abdominal: Soft. Bowel sounds are normal. She exhibits no distension and no mass. There is no tenderness. There is no rebound and no guarding.  Musculoskeletal: Normal range of motion. She exhibits no edema and no tenderness.  Lymphadenopathy:    She has no cervical adenopathy.  Neurological: She is alert and oriented to person, place, and time. No cranial nerve deficit.  Skin: Skin is warm and dry. No rash noted. No erythema. No pallor.  Psychiatric: She has a normal mood and affect. Her behavior is normal. Judgment and thought content normal.          Assessment & Plan:  1.  3 month F/U for Cholesterol, Pre-Dm, D. Deficient. Needs healthy diet, cardio QD and obtain healthy weight. Check Labs, Check BP if >130/80 call office 2. Fatigue- check labs, increase  activity and H2O 3. Sinusitis/ Allergic rhinitis- Allegra OTC, increase H2o, allergy hygiene explained. Dexamethasone 10 mg AD. Augmentin AD, may need ENT EVAL

## 2013-10-25 ENCOUNTER — Other Ambulatory Visit: Payer: Self-pay | Admitting: Emergency Medicine

## 2013-10-25 ENCOUNTER — Ambulatory Visit (HOSPITAL_COMMUNITY)
Admission: RE | Admit: 2013-10-25 | Discharge: 2013-10-25 | Disposition: A | Discharge status: Home / Self Care | Payer: BC Managed Care – PPO | Source: Ambulatory Visit | Attending: Gynecology | Admitting: Gynecology

## 2013-10-25 DIAGNOSIS — Z1231 Encounter for screening mammogram for malignant neoplasm of breast: Secondary | ICD-10-CM | POA: Insufficient documentation

## 2013-10-25 LAB — VITAMIN D 25 HYDROXY (VIT D DEFICIENCY, FRACTURES): VIT D 25 HYDROXY: 40 ng/mL (ref 30–89)

## 2013-10-25 LAB — INSULIN, FASTING: INSULIN FASTING, SERUM: 11 u[IU]/mL (ref 3–28)

## 2013-10-25 MED ORDER — FLUTICASONE PROPIONATE 50 MCG/ACT NA SUSP: 1.0000 | Freq: Every day | NASAL | Status: DC

## 2013-11-14 ENCOUNTER — Other Ambulatory Visit: Payer: Self-pay | Admitting: Emergency Medicine

## 2013-11-14 MED ORDER — KETOTIFEN FUMARATE 0.025 % OP SOLN: 1.0000 [drp] | Freq: Two times a day (BID) | OPHTHALMIC | Status: DC

## 2013-11-16 ENCOUNTER — Telehealth: Payer: Self-pay | Admitting: *Deleted

## 2013-11-16 NOTE — Telephone Encounter (Signed)
Patient called and left message with the front staff asking for Kelby Aline, PA-C to send in Rx eye drops.  States Ketotifen Rx not working for symptoms of watery, itchy eyes.  Left eye showing some increased redness and swelling. Per Kelby Aline, PA-C patient was advised that she sent in allergy eye drops and if no relief she will need eye doctor evaluation for further treatment.  Patient aware.

## 2013-12-03 ENCOUNTER — Other Ambulatory Visit: Payer: Self-pay | Admitting: Emergency Medicine

## 2014-01-12 ENCOUNTER — Encounter: Payer: Self-pay | Admitting: Emergency Medicine

## 2014-01-26 ENCOUNTER — Ambulatory Visit (INDEPENDENT_AMBULATORY_CARE_PROVIDER_SITE_OTHER): Payer: BC Managed Care – PPO | Admitting: Emergency Medicine

## 2014-01-26 ENCOUNTER — Encounter: Payer: Self-pay | Admitting: Emergency Medicine

## 2014-01-26 VITALS — BP 110/78 | HR 72 | Temp 98.0°F | Resp 18 | Ht 62.5 in | Wt 145.0 lb

## 2014-01-26 DIAGNOSIS — R059 Cough, unspecified: Secondary | ICD-10-CM

## 2014-01-26 DIAGNOSIS — Z111 Encounter for screening for respiratory tuberculosis: Secondary | ICD-10-CM

## 2014-01-26 DIAGNOSIS — Z23 Encounter for immunization: Secondary | ICD-10-CM

## 2014-01-26 DIAGNOSIS — E559 Vitamin D deficiency, unspecified: Secondary | ICD-10-CM

## 2014-01-26 DIAGNOSIS — I10 Essential (primary) hypertension: Secondary | ICD-10-CM

## 2014-01-26 DIAGNOSIS — J309 Allergic rhinitis, unspecified: Secondary | ICD-10-CM

## 2014-01-26 DIAGNOSIS — E782 Mixed hyperlipidemia: Secondary | ICD-10-CM

## 2014-01-26 DIAGNOSIS — Z79899 Other long term (current) drug therapy: Secondary | ICD-10-CM

## 2014-01-26 DIAGNOSIS — Z0000 Encounter for general adult medical examination without abnormal findings: Secondary | ICD-10-CM

## 2014-01-26 DIAGNOSIS — F411 Generalized anxiety disorder: Secondary | ICD-10-CM

## 2014-01-26 DIAGNOSIS — R05 Cough: Secondary | ICD-10-CM

## 2014-01-26 DIAGNOSIS — Z1212 Encounter for screening for malignant neoplasm of rectum: Secondary | ICD-10-CM

## 2014-01-26 LAB — HEMOGLOBIN A1C
HEMOGLOBIN A1C: 5.7 % — AB (ref <5.7)
Mean Plasma Glucose: 117 mg/dL — ABNORMAL HIGH (ref <117)

## 2014-01-26 LAB — CBC WITH DIFFERENTIAL/PLATELET
BASOS ABS: 0.1 10*3/uL (ref 0.0–0.1)
BASOS PCT: 1 % (ref 0–1)
EOS ABS: 0.2 10*3/uL (ref 0.0–0.7)
Eosinophils Relative: 3 % (ref 0–5)
HCT: 43.3 % (ref 36.0–46.0)
Hemoglobin: 14.9 g/dL (ref 12.0–15.0)
Lymphocytes Relative: 29 % (ref 12–46)
Lymphs Abs: 1.8 10*3/uL (ref 0.7–4.0)
MCH: 32.5 pg (ref 26.0–34.0)
MCHC: 34.4 g/dL (ref 30.0–36.0)
MCV: 94.3 fL (ref 78.0–100.0)
Monocytes Absolute: 0.5 10*3/uL (ref 0.1–1.0)
Monocytes Relative: 8 % (ref 3–12)
NEUTROS ABS: 3.7 10*3/uL (ref 1.7–7.7)
NEUTROS PCT: 59 % (ref 43–77)
PLATELETS: 250 10*3/uL (ref 150–400)
RBC: 4.59 MIL/uL (ref 3.87–5.11)
RDW: 12.9 % (ref 11.5–15.5)
WBC: 6.2 10*3/uL (ref 4.0–10.5)

## 2014-01-26 MED ORDER — AZELASTINE HCL 0.1 % NA SOLN: 2.0000 | Freq: Two times a day (BID) | NASAL | Status: DC

## 2014-01-26 NOTE — Progress Notes (Signed)
Subjective:    Patient ID: Kathryn Booker, female    DOB: 03-Jul-1951, 63 y.o.   MRN: [id number]  HPI Comments: 63 yo WF CPE and Chol f/u. She has been trying to improve activity level and diet. Last abnormal labs T 208 TG 173 L 121 A1C 5.8 She has myalgias with statins in the past but seems to be tolerating Livalo with out complaint. She stopped Zetia due to cost.  She notes increased allergy drainage and she has not done NS. She notes it has improved with moving into new home but still with L>R ear pain.   She quit smoking 11/19/12 and is using nicotene gum. She still has cough but it is not productive. She has chronic hoarseness. She denies reflux.  She notes stress has improved with job, dog, and life and feels overall better.   Hyperlipidemia   Current Outpatient Prescriptions on File Prior to Visit  Medication Sig Dispense Refill  . albuterol (ACCUNEB) 1.25 MG/3ML nebulizer solution Take 1 ampule by nebulization every 6 (six) hours as needed.        [name] buPROPion (WELLBUTRIN XL) 300 MG 24 hr tablet TAKE 1 TABLET BY MOUTH EVERY DAY  90 tablet  1  . ciclesonide (OMNARIS) 50 MCG/ACT nasal spray Place 2 sprays into both nostrils daily.  12.5 g  0  . citalopram (CELEXA) 20 MG tablet Take 20 mg by mouth daily.        . Fluticasone-Salmeterol (ADVAIR DISKUS) 250-50 MCG/DOSE AEPB Inhale 1 puff into the lungs every 12 (twelve) hours.        . GuaiFENesin (MUCINEX PO) Take by mouth as needed.      [name] ketotifen (ZADITOR) 0.025 % ophthalmic solution Place 1 drop into both eyes 2 (two) times daily.  10 mL  2  . mometasone (NASONEX) 50 MCG/ACT nasal spray Place 2 sprays into the nose as needed.        . Pitavastatin Calcium (LIVALO) 4 MG TABS Take 4 mg by mouth daily.      . valACYclovir (VALTREX) 500 MG tablet Take 500 mg by mouth daily.         No current facility-administered medications on file prior to visit.   Allergies  Allergen Reactions  . Lipitor [Atorvastatin]     Myalgias     Past Medical History  Diagnosis Date  . Serum calcium elevated   . Anxiety   . Elevated cholesterol   . RBBB (right bundle branch block with left anterior fascicular block)   . Thyroid disease   . Fatigue   . Depression   . Asthma   . COPD (chronic obstructive pulmonary disease)   . Scoliosis    Past Surgical History  Procedure Laterality Date  . Tonsilectomy, adenoidectomy, bilateral myringotomy and tubes    . Ovarian cyst removal    . Appendectomy    . Laproscopic      X 2 IN EARLY 30-40'S   Family History  Problem Relation Age of Onset  . Heart failure Mother   . Hypertension Mother   . Hyperlipidemia Mother   . Heart disease Mother   . Liver disease Father   . Alcohol abuse Father     Patient Care Team: Unk Pinto, MD as PCP - General (Internal Medicine) Adam Phenix, MD as Consulting Physician (Gynecology) Royston Cowper, DDS (Dentistry) Josue Hector, MD as Consulting Physician (Cardiology) Inda Castle, MD as Consulting Physician (Gastroenterology) Lavonna Monarch, MD as Consulting Physician (  Dermatology) Jacelyn Pi, MD as Consulting Physician (Endocrinology) Earnstine Regal, MD as Consulting Physician (General Surgery)  MAINTENANCE: Colonoscopy:2008 due 2018 Mammo:06/11/12 BMD: 2012 osteopenia at gyn Pap/ Pelvic:2013  DPO:2423 Dentist:q 6 months CXR: AT urgent care per pt WNL  IMMUNIZATIONS: Tdap:10/17/7 Pneumovax:12/25/11 Zostavax:01/26/14 Influenza:?   Review of Systems  HENT: Positive for ear pain and postnasal drip.   Respiratory: Positive for cough.   All other systems reviewed and are negative.  BP 110/78  Pulse 72  Temp(Src) 98 F (36.7 C) (Temporal)  Resp 18  Ht 5' 2.5" (1.588 m)  Wt 145 lb (65.772 kg)  BMI 26.08 kg/m2     Objective:   Physical Exam  Nursing note and vitals reviewed. Constitutional: She is oriented to person, place, and time. She appears well-developed and well-nourished. No distress.  HENT:  Head:  Normocephalic and atraumatic.  Right Ear: External ear normal.  Left Ear: External ear normal.  Nose: Nose normal.  Mouth/Throat: Oropharynx is clear and moist. No oropharyngeal exudate.  Cloudy TM's bilaterally, mildly injected, hoarse voice with constant clearing of throat  Eyes: Conjunctivae and EOM are normal. Pupils are equal, round, and reactive to light. Right eye exhibits no discharge. Left eye exhibits no discharge. No scleral icterus.  Neck: Normal range of motion. Neck supple. No JVD present. No tracheal deviation present. No thyromegaly present.  Cardiovascular: Normal rate, regular rhythm, normal heart sounds and intact distal pulses.   Pulmonary/Chest: Effort normal and breath sounds normal.  Abdominal: Soft. Bowel sounds are normal. She exhibits no distension and no mass. There is no tenderness. There is no rebound and no guarding.  Genitourinary:  Def gyn  Musculoskeletal: Normal range of motion. She exhibits no edema and no tenderness.  Lymphadenopathy:    She has no cervical adenopathy.  Neurological: She is alert and oriented to person, place, and time. She has normal reflexes. No cranial nerve deficit. She exhibits normal muscle tone. Coordination normal.  Skin: Skin is warm and dry. No rash noted. No erythema. No pallor.  Psychiatric: She has a normal mood and affect. Her behavior is normal. Judgment and thought content normal.      AORTA SCAN WNL EKG NSCSPT     Assessment & Plan:  1. CPE- Update screening labs/ History/ Immunizations/ Testing as needed. Advised healthy diet, QD exercise, increase H20 and continue RX/ Vitamins AD.  2. Cholesterol- recheck labs, Need to eat healthier and exercise AD.  3. Anxiety- Controlled currently, continue RX AD w/c if SX increase or ER, recommend counseling if symptoms continue  4.cough vs Allergic rhinitis- Allegra OTC, increase H2o, allergy hygiene explained. MAY need GI referral to eval for ? GERD with chronic cough w/c  with results of AStepro in p.m./ FLonase in a.m.

## 2014-01-26 NOTE — Patient Instructions (Addendum)
Allergic Rhinitis Allergic rhinitis is when the mucous membranes in the nose respond to allergens. Allergens are particles in the air that cause your body to have an allergic reaction. This causes you to release allergic antibodies. Through a chain of events, these eventually cause you to release histamine into the blood stream. Although meant to protect the body, it is this release of histamine that causes your discomfort, such as frequent sneezing, congestion, and an itchy, runny nose.  CAUSES  Seasonal allergic rhinitis (hay fever) is caused by pollen allergens that may come from grasses, trees, and weeds. Year-round allergic rhinitis (perennial allergic rhinitis) is caused by allergens such as house dust mites, pet dander, and mold spores.  SYMPTOMS   Nasal stuffiness (congestion).  Itchy, runny nose with sneezing and tearing of the eyes. DIAGNOSIS  Your health care provider can help you determine the allergen or allergens that trigger your symptoms. If you and your health care provider are unable to determine the allergen, skin or blood testing may be used. TREATMENT  Allergic Rhinitis does not have a cure, but it can be controlled by:  Medicines and allergy shots (immunotherapy).  Avoiding the allergen. Hay fever may often be treated with antihistamines in pill or nasal spray forms. Antihistamines block the effects of histamine. There are over-the-counter medicines that may help with nasal congestion and swelling around the eyes. Check with your health care provider before taking or giving this medicine.  If avoiding the allergen or the medicine prescribed do not work, there are many new medicines your health care provider can prescribe. Stronger medicine may be used if initial measures are ineffective. Desensitizing injections can be used if medicine and avoidance does not work. Desensitization is when a patient is given ongoing shots until the body becomes less sensitive to the allergen.  Make sure you follow up with your health care provider if problems continue. HOME CARE INSTRUCTIONS It is not possible to completely avoid allergens, but you can reduce your symptoms by taking steps to limit your exposure to them. It helps to know exactly what you are allergic to so that you can avoid your specific triggers. SEEK MEDICAL CARE IF:   You have a fever.  You develop a cough that does not stop easily (persistent).  You have shortness of breath.  You start wheezing.  Symptoms interfere with normal daily activities. Document Released: 06/17/2001 Document Revised: 07/13/2013 Document Reviewed: 05/30/2013 [facility] Patient Information 2014 [place].  Cough, Adult  A cough is a reflex. It helps you clear your throat and airways. A cough can help heal your body. A cough can last 2 or 3 weeks (acute) or may last more than 8 weeks (chronic). Some common causes of a cough can include an infection, allergy, or a cold. HOME CARE  Only take medicine as told by your doctor.  If given, take your medicines (antibiotics) as told. Finish them even if you start to feel better.  Use a cold steam vaporizer or humidier in your home. This can help loosen thick spit (secretions).  Sleep so you are almost sitting up (semi-upright). Use pillows to do this. This helps reduce coughing.  Rest as needed.  Stop smoking if you smoke. GET HELP RIGHT AWAY IF:  You have yellowish-white fluid (pus) in your thick spit.  Your cough gets worse.  Your medicine does not reduce coughing, and you are losing sleep.  You cough up blood.  You have trouble breathing.  Your pain gets worse and  medicine does not help.  You have a fever. MAKE SURE YOU:   Understand these instructions.  Will watch your condition.  Will get help right away if you are not doing well or get worse. Document Released: 06/05/2011 Document Revised: 12/15/2011 Document Reviewed: 06/05/2011 [facility] Patient  Information 2014 [place]. Diet for Gastroesophageal Reflux Disease, Adult Reflux (acid reflux) is when acid from your stomach flows up into the esophagus. When acid comes in contact with the esophagus, the acid causes irritation and soreness (inflammation) in the esophagus. When reflux happens often or so severely that it causes damage to the esophagus, it is called gastroesophageal reflux disease (GERD). Nutrition therapy can help ease the discomfort of GERD. FOODS OR DRINKS TO AVOID OR LIMIT  Smoking or chewing tobacco. Nicotine is one of the most potent stimulants to acid production in the gastrointestinal tract.  Caffeinated and decaffeinated coffee and black tea.  Regular or low-calorie carbonated beverages or energy drinks (caffeine-free carbonated beverages are allowed).   Strong spices, such as black pepper, white pepper, red pepper, cayenne, curry powder, and chili powder.  Peppermint or spearmint.  Chocolate.  High-fat foods, including meats and fried foods. Extra added fats including oils, butter, salad dressings, and nuts. Limit these to less than 8 tsp per day.  Fruits and vegetables if they are not tolerated, such as citrus fruits or tomatoes.  Alcohol.  Any food that seems to aggravate your condition. If you have questions regarding your diet, call your caregiver or a registered dietitian. OTHER THINGS THAT MAY HELP GERD INCLUDE:   Eating your meals slowly, in a relaxed setting.  Eating 5 to 6 small meals per day instead of 3 large meals.  Eliminating food for a period of time if it causes distress.  Not lying down until 3 hours after eating a meal.  Keeping the head of your bed raised 6 to 9 inches (15 to 23 cm) by using a foam wedge or blocks under the legs of the bed. Lying flat may make symptoms worse.  Being physically active. Weight loss may be helpful in reducing reflux in overweight or obese adults.  Wear loose fitting clothing EXAMPLE MEAL  PLAN This meal plan is approximately 2,000 calories based on [email or web address] meal planning guidelines. Breakfast   cup cooked oatmeal.  1 cup strawberries.  1 cup low-fat milk.  1 oz almonds. Snack  1 cup cucumber slices.  6 oz yogurt (made from low-fat or fat-free milk). Lunch  2 slice whole-wheat bread.  2 oz sliced [place].  2 tsp mayonnaise.  1 cup blueberries.  1 cup snap peas. Snack  6 whole-wheat crackers.  1 oz string cheese. Dinner   cup brown rice.  1 cup mixed veggies.  1 tsp olive oil.  3 oz grilled fish. Document Released: 09/22/2005 Document Revised: 12/15/2011 Document Reviewed: 08/08/2011 [facility] Patient Information 2014 [place], [place].

## 2014-01-27 LAB — HEPATIC FUNCTION PANEL
ALBUMIN: 4.7 g/dL (ref 3.5–5.2)
ALT: 24 U/L (ref 0–35)
AST: 17 U/L (ref 0–37)
Alkaline Phosphatase: 75 U/L (ref 39–117)
Bilirubin, Direct: 0.1 mg/dL (ref 0.0–0.3)
Indirect Bilirubin: 0.6 mg/dL (ref 0.2–1.2)
TOTAL PROTEIN: 7.1 g/dL (ref 6.0–8.3)
Total Bilirubin: 0.7 mg/dL (ref 0.2–1.2)

## 2014-01-27 LAB — URINALYSIS, ROUTINE W REFLEX MICROSCOPIC
BILIRUBIN URINE: NEGATIVE
GLUCOSE, UA: NEGATIVE mg/dL
HGB URINE DIPSTICK: NEGATIVE
KETONES UR: NEGATIVE mg/dL
Leukocytes, UA: NEGATIVE
Nitrite: NEGATIVE
PH: 6 (ref 5.0–8.0)
Protein, ur: NEGATIVE mg/dL
SPECIFIC GRAVITY, URINE: 1.012 (ref 1.005–1.030)
Urobilinogen, UA: 0.2 mg/dL (ref 0.0–1.0)

## 2014-01-27 LAB — LIPID PANEL
CHOL/HDL RATIO: 3.6 ratio
Cholesterol: 205 mg/dL — ABNORMAL HIGH (ref 0–200)
HDL: 57 mg/dL (ref >39)
LDL Cholesterol: 110 mg/dL — ABNORMAL HIGH (ref 0–99)
Triglycerides: 189 mg/dL — ABNORMAL HIGH (ref <150)
VLDL: 38 mg/dL (ref 0–40)

## 2014-01-27 LAB — VITAMIN D 25 HYDROXY (VIT D DEFICIENCY, FRACTURES): VIT D 25 HYDROXY: 34 ng/mL (ref 30–89)

## 2014-01-27 LAB — MICROALBUMIN / CREATININE URINE RATIO
Creatinine, Urine: 55.2 mg/dL
MICROALB/CREAT RATIO: 9.1 mg/g (ref 0.0–30.0)
Microalb, Ur: 0.5 mg/dL (ref 0.00–1.89)

## 2014-01-27 LAB — BASIC METABOLIC PANEL WITH GFR
BUN: 15 mg/dL (ref 6–23)
CALCIUM: 10.2 mg/dL (ref 8.4–10.5)
CO2: 22 meq/L (ref 19–32)
Chloride: 104 mEq/L (ref 96–112)
Creat: 0.67 mg/dL (ref 0.50–1.10)
GFR, Est Non African American: >89 mL/min
GLUCOSE: 100 mg/dL — AB (ref 70–99)
POTASSIUM: 4.6 meq/L (ref 3.5–5.3)
SODIUM: 136 meq/L (ref 135–145)

## 2014-01-27 LAB — MAGNESIUM: Magnesium: 1.6 mg/dL (ref 1.5–2.5)

## 2014-01-27 LAB — INSULIN, FASTING: INSULIN FASTING, SERUM: 16 u[IU]/mL (ref 3–28)

## 2014-01-27 LAB — TSH: TSH: 1.738 u[IU]/mL (ref 0.350–4.500)

## 2014-01-30 LAB — TB SKIN TEST
Induration: 0 mm
TB Skin Test: NEGATIVE

## 2014-02-01 ENCOUNTER — Encounter: Payer: Self-pay | Admitting: Emergency Medicine

## 2014-02-08 ENCOUNTER — Other Ambulatory Visit (INDEPENDENT_AMBULATORY_CARE_PROVIDER_SITE_OTHER): Payer: BC Managed Care – PPO | Admitting: *Deleted

## 2014-02-08 DIAGNOSIS — Z1212 Encounter for screening for malignant neoplasm of rectum: Secondary | ICD-10-CM

## 2014-02-08 DIAGNOSIS — Z0000 Encounter for general adult medical examination without abnormal findings: Secondary | ICD-10-CM

## 2014-02-08 LAB — POC HEMOCCULT BLD/STL (HOME/3-CARD/SCREEN)
Card #3 Fecal Occult Blood, POC: NEGATIVE
FECAL OCCULT BLD: NEGATIVE
Fecal Occult Blood, POC: NEGATIVE

## 2014-02-14 ENCOUNTER — Ambulatory Visit (INDEPENDENT_AMBULATORY_CARE_PROVIDER_SITE_OTHER): Payer: BC Managed Care – PPO | Admitting: Internal Medicine

## 2014-02-14 ENCOUNTER — Encounter: Payer: Self-pay | Admitting: Internal Medicine

## 2014-02-14 VITALS — BP 110/76 | HR 78 | Temp 97.8°F | Resp 18 | Ht 63.0 in | Wt 148.0 lb

## 2014-02-14 DIAGNOSIS — E559 Vitamin D deficiency, unspecified: Secondary | ICD-10-CM | POA: Insufficient documentation

## 2014-02-14 DIAGNOSIS — M7710 Lateral epicondylitis, unspecified elbow: Secondary | ICD-10-CM

## 2014-02-14 DIAGNOSIS — M7711 Lateral epicondylitis, right elbow: Secondary | ICD-10-CM

## 2014-02-14 MED ORDER — DEXAMETHASONE SODIUM PHOSPHATE 4 MG/ML IJ SOLN
10.0000 mg | Freq: Once | INTRAMUSCULAR | Status: AC
  Administered 2014-03-21: 10 mg via INTRA_ARTICULAR

## 2014-02-14 NOTE — Progress Notes (Signed)
   Subjective:    Patient ID: Kathryn Booker, female    DOB: 26-Jan-1951, 63 y.o.   MRN: [id number]  HPI Patient relates after bowling about 2 weeks ago the was a 5 day lag and then she started developing Rt elbow pain which has persisted about a week now and is worsened with activity. Meds and allergies reviewed.  Review of Systems neg otherwise  Objective:   Physical Exam  BP 110/76  Pulse 78  Temp 97.8 F   Resp 18  Ht 5\' 3"    Wt 148 lb   BMI 26.22 kg/m2  focused exam finds exquisite tenderness of the right lateral epicondyle.  Assessment & Plan:   1. Right tennis elbow  - dexamethasone (DECADRON) injection 1 ml of 10 mg/cc  (10 mg total) and 1.0 ml of lidocaine 1%  into the rt lateral epicondyle tendon sheath with immediate relief of patient's pain & tenderness. - patient instructed to purchase a velcro strap splint to use before activity.

## 2014-02-14 NOTE — Patient Instructions (Signed)
Tennis Elbow  Your caregiver has diagnosed you with a condition often referred to as "tennis elbow." This results from small tears or soreness (inflammation) at the start (origin) of the extensor muscles of the forearm. Although the condition is often called tennis or golfer's elbow, it is caused by any repetitive action performed by your elbow.  HOME CARE INSTRUCTIONS   If the condition has been short lived, rest may be the only treatment required. Using your opposite hand or arm to perform the task may help. Even changing your grip may help rest the extremity. These may even prevent the condition from recurring.   Longer standing problems, however, will often be relieved faster by:   Using anti-inflammatory agents.   Applying ice packs for 30 minutes at the end of the working day, at bed time, or when activities are finished.   Your caregiver may also have you wear a splint or sling. This will allow the inflamed tendon to heal.  At times, steroid injections aided with a local anesthetic will be required along with splinting for 1 to 2 weeks. Two to three steroid injections will often solve the problem. In some long standing cases, the inflamed tendon does not respond to conservative (non-surgical) therapy. Then surgery may be required to repair it.  MAKE SURE YOU:    Understand these instructions.   Will watch your condition.   Will get help right away if you are not doing well or get worse.  Document Released: 09/22/2005 Document Revised: 12/15/2011 Document Reviewed: 05/10/2008  [employer] Patient Information 2014 [employer].

## 2014-03-06 ENCOUNTER — Other Ambulatory Visit: Payer: Self-pay | Admitting: Emergency Medicine

## 2014-03-09 ENCOUNTER — Other Ambulatory Visit: Payer: Self-pay | Admitting: Emergency Medicine

## 2014-03-09 MED ORDER — FLUTICASONE-SALMETEROL 250-50 MCG/DOSE IN AEPB: INHALATION_SPRAY | RESPIRATORY_TRACT | Status: DC

## 2014-03-12 ENCOUNTER — Encounter: Payer: Self-pay | Admitting: Emergency Medicine

## 2014-03-21 ENCOUNTER — Encounter: Payer: Self-pay | Admitting: Internal Medicine

## 2014-03-21 ENCOUNTER — Ambulatory Visit (INDEPENDENT_AMBULATORY_CARE_PROVIDER_SITE_OTHER): Payer: BC Managed Care – PPO | Admitting: Internal Medicine

## 2014-03-21 VITALS — BP 122/74 | HR 68 | Temp 98.1°F | Resp 16 | Ht 63.0 in | Wt 151.2 lb

## 2014-03-21 DIAGNOSIS — M7711 Lateral epicondylitis, right elbow: Secondary | ICD-10-CM

## 2014-03-21 DIAGNOSIS — M7710 Lateral epicondylitis, unspecified elbow: Secondary | ICD-10-CM

## 2014-03-21 MED ORDER — DEXAMETHASONE SODIUM PHOSPHATE 100 MG/10ML IJ SOLN: 10.0000 mg | Freq: Once | INTRAMUSCULAR | Status: DC

## 2014-03-21 NOTE — Patient Instructions (Signed)
Tennis Elbow Tennis elbow can happen in any sport or job where you overuse your elbow. It is caused by doing the same motion over and over. This makes small tears in the forearm muscles. It can also cause muscle redness, soreness, and puffiness (inflammation). HOME CARE  Rest.  Use your other hand or arm that is not affected. Change your grip.  Only take medicine as told by your doctor.  Put ice on the area after activity.  Put ice in a plastic bag.  Place a towel between your skin and the bag.  Leave the ice on for 15-20 minutes, 03-04 times a day.  Wear a splint or sling as told by your doctor. This lets the area rest and heal faster. GET HELP RIGHT AWAY IF: Your pain gets worse or does not improve in 2 weeks even with treatment. MAKE SURE YOU:   Understand these instructions.  Will watch your condition.  Will get help right away if you are not doing well or get worse. Document Released: 03/12/2010 Document Revised: 12/15/2011 Document Reviewed: 03/12/2010 [facility] Patient Information 2014 [place].

## 2014-03-21 NOTE — Progress Notes (Signed)
   Subjective:    Patient ID: Kathryn Booker, female    DOB: Aug 25, 1951, 63 y.o.   MRN: [id number]  HPI nPatient returns todat with a relapse of pain from her Rt Elbow epicondylitis . After steroid injection ~  1 month ago , she did have an initial good response and used a Velcro splint with some benefit. But her pain has worsened and she defers ortho referral currently requesting a 2sd trial of steroid injection with th understanding that if she fails a 2sd injection that Ortho referral will be forthcoming.  Meds/All/PMH -all reviewed & unchanged  Review of Systems  Neg to above   Objective:   Physical Exam  BP 122/74  Pulse 68  Temp(Src) 98.1 F (36.7 C) (Temporal)  Resp 16  Ht 5\' 3"  (1.6 m)  Wt 151 lb 3.2 oz (68.584 kg)  BMI 26.79 kg/m2  Focused exam finds an exquisitely tender Rt elbow lateral epicondyle to palpitation and devoid of any signs of inflammation.   Assessment & Plan:   1. Right tennis elbow  - dexamethasone (DECADRON) injection 10 mg; Inject 1 mL (10 mg total) into the tendon insertion trigger point area.space once.

## 2014-03-27 ENCOUNTER — Encounter: Payer: Self-pay | Admitting: Emergency Medicine

## 2014-04-06 ENCOUNTER — Other Ambulatory Visit: Payer: Self-pay | Admitting: Emergency Medicine

## 2014-04-30 ENCOUNTER — Other Ambulatory Visit: Payer: Self-pay | Admitting: Emergency Medicine

## 2014-05-02 ENCOUNTER — Ambulatory Visit: Payer: Self-pay | Admitting: Emergency Medicine

## 2014-05-10 ENCOUNTER — Ambulatory Visit: Payer: Self-pay | Admitting: Emergency Medicine

## 2014-05-18 ENCOUNTER — Ambulatory Visit (INDEPENDENT_AMBULATORY_CARE_PROVIDER_SITE_OTHER): Payer: BC Managed Care – PPO | Admitting: Physician Assistant

## 2014-05-18 ENCOUNTER — Encounter: Payer: Self-pay | Admitting: Physician Assistant

## 2014-05-18 VITALS — BP 120/70 | HR 68 | Temp 98.1°F | Resp 16 | Ht 63.0 in | Wt 154.0 lb

## 2014-05-18 DIAGNOSIS — E559 Vitamin D deficiency, unspecified: Secondary | ICD-10-CM

## 2014-05-18 DIAGNOSIS — E039 Hypothyroidism, unspecified: Secondary | ICD-10-CM

## 2014-05-18 DIAGNOSIS — J4520 Mild intermittent asthma, uncomplicated: Secondary | ICD-10-CM

## 2014-05-18 DIAGNOSIS — J45909 Unspecified asthma, uncomplicated: Secondary | ICD-10-CM

## 2014-05-18 DIAGNOSIS — F329 Major depressive disorder, single episode, unspecified: Secondary | ICD-10-CM

## 2014-05-18 DIAGNOSIS — F411 Generalized anxiety disorder: Secondary | ICD-10-CM

## 2014-05-18 DIAGNOSIS — E782 Mixed hyperlipidemia: Secondary | ICD-10-CM

## 2014-05-18 DIAGNOSIS — R7303 Prediabetes: Secondary | ICD-10-CM

## 2014-05-18 DIAGNOSIS — F17200 Nicotine dependence, unspecified, uncomplicated: Secondary | ICD-10-CM

## 2014-05-18 DIAGNOSIS — F3289 Other specified depressive episodes: Secondary | ICD-10-CM

## 2014-05-18 DIAGNOSIS — F32A Depression, unspecified: Secondary | ICD-10-CM

## 2014-05-18 DIAGNOSIS — Z79899 Other long term (current) drug therapy: Secondary | ICD-10-CM

## 2014-05-18 DIAGNOSIS — R7309 Other abnormal glucose: Secondary | ICD-10-CM

## 2014-05-18 DIAGNOSIS — F419 Anxiety disorder, unspecified: Secondary | ICD-10-CM

## 2014-05-18 LAB — LIPID PANEL
Cholesterol: 199 mg/dL (ref 0–200)
HDL: 43 mg/dL (ref >39)
LDL Cholesterol: 86 mg/dL (ref 0–99)
Total CHOL/HDL Ratio: 4.6 Ratio
Triglycerides: 348 mg/dL — ABNORMAL HIGH (ref <150)
VLDL: 70 mg/dL — ABNORMAL HIGH (ref 0–40)

## 2014-05-18 LAB — BASIC METABOLIC PANEL WITH GFR
BUN: 15 mg/dL (ref 6–23)
CHLORIDE: 103 meq/L (ref 96–112)
CO2: 27 meq/L (ref 19–32)
CREATININE: 0.79 mg/dL (ref 0.50–1.10)
Calcium: 10.5 mg/dL (ref 8.4–10.5)
GFR, Est African American: >89 mL/min
GFR, Est Non African American: 80 mL/min
Glucose, Bld: 125 mg/dL — ABNORMAL HIGH (ref 70–99)
Potassium: 4.8 mEq/L (ref 3.5–5.3)
Sodium: 140 mEq/L (ref 135–145)

## 2014-05-18 LAB — CBC WITH DIFFERENTIAL/PLATELET
BASOS ABS: 0.1 10*3/uL (ref 0.0–0.1)
Basophils Relative: 1 % (ref 0–1)
Eosinophils Absolute: 0.5 10*3/uL (ref 0.0–0.7)
Eosinophils Relative: 8 % — ABNORMAL HIGH (ref 0–5)
HEMATOCRIT: 40.1 % (ref 36.0–46.0)
Hemoglobin: 13.7 g/dL (ref 12.0–15.0)
LYMPHS ABS: 1.6 10*3/uL (ref 0.7–4.0)
LYMPHS PCT: 26 % (ref 12–46)
MCH: 32.4 pg (ref 26.0–34.0)
MCHC: 34.2 g/dL (ref 30.0–36.0)
MCV: 94.8 fL (ref 78.0–100.0)
MONO ABS: 0.4 10*3/uL (ref 0.1–1.0)
Monocytes Relative: 7 % (ref 3–12)
Neutro Abs: 3.7 10*3/uL (ref 1.7–7.7)
Neutrophils Relative %: 58 % (ref 43–77)
Platelets: 249 10*3/uL (ref 150–400)
RBC: 4.23 MIL/uL (ref 3.87–5.11)
RDW: 13.2 % (ref 11.5–15.5)
WBC: 6.3 10*3/uL (ref 4.0–10.5)

## 2014-05-18 LAB — HEMOGLOBIN A1C
HEMOGLOBIN A1C: 5.9 % — AB (ref <5.7)
MEAN PLASMA GLUCOSE: 123 mg/dL — AB (ref <117)

## 2014-05-18 LAB — HEPATIC FUNCTION PANEL
ALBUMIN: 4.6 g/dL (ref 3.5–5.2)
ALK PHOS: 61 U/L (ref 39–117)
ALT: 40 U/L — AB (ref 0–35)
AST: 22 U/L (ref 0–37)
Bilirubin, Direct: 0.1 mg/dL (ref 0.0–0.3)
Indirect Bilirubin: 0.5 mg/dL (ref 0.2–1.2)
Total Bilirubin: 0.6 mg/dL (ref 0.2–1.2)
Total Protein: 7.1 g/dL (ref 6.0–8.3)

## 2014-05-18 LAB — TSH: TSH: 2.157 u[IU]/mL (ref 0.350–4.500)

## 2014-05-18 LAB — MAGNESIUM: MAGNESIUM: 1.8 mg/dL (ref 1.5–2.5)

## 2014-05-18 MED ORDER — PREDNISONE 20 MG PO TABS: ORAL_TABLET | ORAL | Status: DC

## 2014-05-18 MED ORDER — LEVOFLOXACIN 500 MG PO TABS: 500.0000 mg | ORAL_TABLET | Freq: Every day | ORAL | Status: AC

## 2014-05-18 NOTE — Progress Notes (Signed)
Assessment and Plan:  Hypertension: Continue medication, monitor blood pressure at home. Continue DASH diet. Cholesterol: Continue diet and exercise. Check cholesterol.  Pre-diabetes-Continue diet and exercise. Check A1C Vitamin D Def- check level and continue medications.  Chronic sinusitis- Levaquin and prednisone, if not better send to ENT for evaluation  Continue diet and meds as discussed. Further disposition pending results of labs.  HPI 63 y.o. female  presents for 3 month follow up with hypertension, hyperlipidemia, prediabetes and vitamin D. Her blood pressure has been controlled at home, today their BP is BP: 120/70 mmHg She does not workout, she is is trying to start walking. She denies chest pain, shortness of breath, dizziness.  She is on cholesterol medication and denies myalgias. Her cholesterol is at goal. The cholesterol last visit was:   Lab Results  Component Value Date   CHOL 205* 01/26/2014   HDL 57 01/26/2014   LDLCALC 110* 01/26/2014   TRIG 189* 01/26/2014   CHOLHDL 3.6 01/26/2014   She has been working on diet and exercise for prediabetes, and denies paresthesia of the feet, polydipsia and polyuria. Last A1C in the office was:  Lab Results  Component Value Date   HGBA1C 5.7* 01/26/2014   Patient is on Vitamin D supplement.   Lab Results  Component Value Date   VD25OH 34 01/26/2014     Wt Readings from Last 3 Encounters:  05/18/14 154 lb (69.854 kg)  03/21/14 151 lb 3.2 oz (68.584 kg)  02/14/14 148 lb (67.132 kg)   She has a history of deviated septum and history of smoking but quit in Feb, she states she has had sinus congestion for for the last month and sinus issues for the last 2-3 months. She sounds very congested/nasal, post nasal drip, severe sinus headaches, denies fever, chills, teeth pain.   Current Medications:  Current Outpatient Prescriptions on File Prior to Visit  Medication Sig Dispense Refill  . albuterol (ACCUNEB) 1.25 MG/3ML nebulizer  solution Take 1 ampule by nebulization every 6 (six) hours as needed.        [name] azelastine (ASTELIN) 137 MCG/SPRAY nasal spray Place 2 sprays into both nostrils 2 (two) times daily. Use in each nostril as directed  30 mL  12  . buPROPion (WELLBUTRIN XL) 300 MG 24 hr tablet TAKE 1 TABLET BY MOUTH EVERY DAY  90 tablet  1  . citalopram (CELEXA) 40 MG tablet TAKE 1 TABLET BY MOUTH EVERY DAY  90 tablet  0  . fluticasone (FLONASE) 50 MCG/ACT nasal spray Place 1 spray into both nostrils daily as needed.      . Fluticasone-Salmeterol (ADVAIR DISKUS) 250-50 MCG/DOSE AEPB INHALE 1 INHALATION BY MOUTH 2 TIMES DAILY, rinse mouth after inhalation  180 each  2  . GuaiFENesin (MUCINEX PO) Take by mouth as needed.      [name] LIVALO 4 MG TABS TAKE 1 TABLET BY MOUTH AT BEDTIME  30 tablet  4  . Magnesium 250 MG TABS Take by mouth daily.      [name] OVER THE COUNTER MEDICATION Eye drop 1 drop each eye daily.      . valACYclovir (VALTREX) 500 MG tablet Take 500 mg by mouth daily.         Current Facility-Administered Medications on File Prior to Visit  Medication Dose Route Frequency Provider Last Rate Last Dose  . dexamethasone (DECADRON) injection 10 mg  10 mg Intra-articular Once Unk Pinto, MD       Medical History:  Past Medical History  Diagnosis Date  . Serum calcium elevated   . Anxiety   . Elevated cholesterol   . RBBB (right bundle branch block with left anterior fascicular block)   . Thyroid disease   . Fatigue   . Depression   . Asthma   . COPD (chronic obstructive pulmonary disease)   . Scoliosis    Allergies:  Allergies  Allergen Reactions  . Lipitor [Atorvastatin]     Myalgias      Review of Systems: [X]  = complains of  [ ]  = denies  General: Fatigue [ ]  Fever [ ]  Chills [ ]  Weakness [ ]   Insomnia [ ]  Eyes: Redness [ ]  Blurred vision [ ]  Diplopia [ ]   ENT: Congestion [x ] Sinus Pain [x ] Post Nasal Drip [ ]  Sore Throat [ ]  Earache [ ]   Cardiac: Chest pain/pressure [ ]  SOB [ ]   Orthopnea [ ]   Palpitations [ ]   Paroxysmal nocturnal dyspnea[ ]  Claudication [ ]  Edema [ ]   Pulmonary: Cough [ ]  Wheezing[ ]   SOB [ ]   Snoring [ ]   GI: Nausea [ ]  Vomiting[ ]  Dysphagia[ ]  Heartburn[ ]  Abdominal pain [ ]  Constipation [ ] ; Diarrhea [ ] ; BRBPR [ ]  Melena[ ]  GU: Hematuria[ ]  Dysuria [ ]  Nocturia[ ]  Urgency [ ]   Hesitancy [ ]  Discharge [ ]  Neuro: Headaches[ ]  Vertigo[ ]  Paresthesias[ ]  Spasm [ ]  Speech changes [ ]  Incoordination [ ]   Ortho: Arthritis [ ]  Joint pain [ ]  Muscle pain [ ]  Joint swelling [ ]  Back Pain [ ]  Skin:  Rash [ ]   Pruritis [ ]  Change in skin lesion [ ]   Psych: Depression[ ]  Anxiety[ ]  Confusion [ ]  Memory loss [ ]   Heme/Lypmh: Bleeding [ ]  Bruising [ ]  Enlarged lymph nodes [ ]   Endocrine: Visual blurring [ ]  Paresthesia [ ]  Polyuria [ ]  Polydypsea [ ]    Heat/cold intolerance [ ]  Hypoglycemia [ ]   Family history- Review and unchanged Social history- Review and unchanged Physical Exam: BP 120/70  Pulse 68  Temp(Src) 98.1 F (36.7 C)  Resp 16  Ht 5\' 3"  (1.6 m)  Wt 154 lb (69.854 kg)  BMI 27.29 kg/m2 Wt Readings from Last 3 Encounters:  05/18/14 154 lb (69.854 kg)  03/21/14 151 lb 3.2 oz (68.584 kg)  02/14/14 148 lb (67.132 kg)   General Appearance: Well nourished, in no apparent distress. Eyes: PERRLA, EOMs, conjunctiva no swelling or erythema Sinuses: No Frontal/maxillary tenderness ENT/Mouth: Ext aud canals clear, TMs without erythema, bulging. No erythema, swelling, or exudate on post pharynx.  Tonsils not swollen or erythematous. Hearing normal.  Neck: Supple, thyroid normal.  Respiratory: Respiratory effort normal, BS equal bilaterally without rales, rhonchi, wheezing or stridor.  Cardio: RRR with no MRGs. Brisk peripheral pulses without edema.  Abdomen: Soft, + BS.  Non tender, no guarding, rebound, hernias, masses. Lymphatics: Non tender without lymphadenopathy.  Musculoskeletal: Full ROM, 5/5 strength, normal gait.  Skin: Warm, dry without  rashes, lesions, ecchymosis.  Neuro: Cranial nerves intact. Normal muscle tone, no cerebellar symptoms. Sensation intact.  Psych: Awake and oriented X 3, normal affect, Insight and Judgment appropriate.    Vicie Mutters 9:30 AM

## 2014-05-18 NOTE — Patient Instructions (Signed)
   Bad carbs also include fruit juice, alcohol, and sweet tea. These are empty calories that do not signal to your brain that you are full.   Please remember the good carbs are still carbs which convert into sugar. So please measure them out no more than 1/2-1 cup of rice, oatmeal, pasta, and beans.  Veggies are however free foods! Pile them on.   I like lean protein at every meal such as chicken, [place], pork chops, cottage cheese, etc. Just do not fry these meats and please center your meal around vegetable, the meats should be a side dish.   No all fruit is created equal. Please see the list below, the fruit at the bottom is higher in sugars than the fruit at the top   We want weight loss that will last so you should lose 1-2 pounds a week.  THAT IS IT! Please pick THREE things a month to change. Once it is a habit check off the item. Then pick another three items off the list to become habits.  If you are already doing a habit on the list GREAT!  Cross that item off! o Don't drink your calories. Ie, alcohol, soda, fruit juice, and sweet tea.  o Drink more water. Drink a glass when you feel hungry or before each meal.  o Eat breakfast - Complex carb and protein (like[place]annon light and fit yogurt, oatmeal, fruit, eggs, [place] bacon). o Measure your cereal.  Eat no more than one cup a day. (ie [place]) o Eat an apple a day. o Add a vegetable a day. o Try a new vegetable a month. o Use Pam! Stop using oil or butter to cook. o Don't finish your plate or use smaller plates. o Share your dessert. o Eat sugar free Jello for dessert or frozen grapes. o Don't eat 2-3 hours before bed. o Switch to whole wheat bread, pasta, and brown rice. o Make healthier choices when you eat out. No fries! o Pick baked chicken, NOT fried. o Don't forget to SLOW DOWN when you eat. It is not going anywhere.  o Take the stairs. o Park far away in the parking lot o [employer] (or weights) for 10 minutes while  watching TV. o Walk at work for 10 minutes during break. o Walk outside 1 time a week with your friend, kids, dog, or significant other. o Start a walking group at [place] the mall as much as you can tolerate.  o Keep a food diary. o Weigh yourself daily. o Walk for 15 minutes 3 days per week. o Cook at home more often and eat out less.  If life happens and you go back to old habits, it is okay.  Just start over. You can do it!   If you experience chest pain, get short of breath, or tired during the exercise, please stop immediately and inform your doctor.

## 2014-05-19 ENCOUNTER — Encounter: Payer: Self-pay | Admitting: Physician Assistant

## 2014-05-19 LAB — VITAMIN D 25 HYDROXY (VIT D DEFICIENCY, FRACTURES): VIT D 25 HYDROXY: 40 ng/mL (ref 30–89)

## 2014-06-08 ENCOUNTER — Other Ambulatory Visit: Payer: Self-pay | Admitting: Emergency Medicine

## 2014-07-04 ENCOUNTER — Encounter: Payer: Self-pay | Admitting: Physician Assistant

## 2014-07-04 MED ORDER — PREDNISONE 20 MG PO TABS: ORAL_TABLET | ORAL | Status: DC

## 2014-08-16 ENCOUNTER — Encounter: Payer: Self-pay | Admitting: Physician Assistant

## 2014-08-21 ENCOUNTER — Other Ambulatory Visit: Payer: Self-pay | Admitting: Gynecology

## 2014-08-22 LAB — CYTOLOGY - PAP

## 2014-09-07 ENCOUNTER — Other Ambulatory Visit: Payer: Self-pay

## 2014-09-07 MED ORDER — FLUTICASONE-SALMETEROL 250-50 MCG/DOSE IN AEPB: INHALATION_SPRAY | RESPIRATORY_TRACT | Status: DC

## 2014-09-11 ENCOUNTER — Other Ambulatory Visit: Payer: Self-pay | Admitting: *Deleted

## 2014-09-11 MED ORDER — FLUTICASONE-SALMETEROL 250-50 MCG/DOSE IN AEPB: INHALATION_SPRAY | RESPIRATORY_TRACT | Status: DC

## 2014-09-16 ENCOUNTER — Ambulatory Visit (INDEPENDENT_AMBULATORY_CARE_PROVIDER_SITE_OTHER): Payer: BC Managed Care – PPO | Admitting: Family Medicine

## 2014-09-16 VITALS — BP 130/72 | HR 82 | Temp 98.2°F | Resp 16 | Ht 62.5 in | Wt 153.0 lb

## 2014-09-16 DIAGNOSIS — J324 Chronic pansinusitis: Secondary | ICD-10-CM

## 2014-09-16 MED ORDER — MONTELUKAST SODIUM 10 MG PO TABS: 10.0000 mg | ORAL_TABLET | Freq: Every day | ORAL | Status: DC

## 2014-09-16 MED ORDER — AMOXICILLIN-POT CLAVULANATE 875-125 MG PO TABS: 1.0000 | ORAL_TABLET | Freq: Two times a day (BID) | ORAL | Status: DC

## 2014-09-16 MED ORDER — PREDNISONE 20 MG PO TABS: ORAL_TABLET | ORAL | Status: DC

## 2014-09-16 MED ORDER — METHYLPREDNISOLONE SODIUM SUCC 125 MG IJ SOLR
62.5000 mg | Freq: Once | INTRAMUSCULAR | Status: AC
  Administered 2014-09-16: 62.5 mg via INTRAMUSCULAR

## 2014-09-16 MED ORDER — LEVOCETIRIZINE DIHYDROCHLORIDE 5 MG PO TABS: 5.0000 mg | ORAL_TABLET | Freq: Every evening | ORAL | Status: DC

## 2014-09-16 NOTE — Progress Notes (Signed)
Subjective:    Patient ID: Kathryn Booker, female    DOB: 12-15-1950, 63 y.o.   MRN: [id number] This chart was scribed for Delman Cheadle, MD by Zola Button, Medical Scribe. This patient was seen in Room 2 and the patient's care was started at 4:08 PM.   HPI HPI Comments: Kathryn Booker is a 63 y.o. female with a hx of COPD and asthma who presents to the [facility] complaining of gradual onset URI symptoms that began over 2 weeks ago but worsened 2 days ago. Patient reports having cough, sneezing, eye discharge, fatigue, nasal congestion, rhinorrhea and ear itching. She states that she has been breathing fine. Patient has tried Human resources officer as well as loratadine in the morning for her symptoms. Patient has not tried Neti pots before. She saw her PCP 3-4 months ago. Patient denies fevers, sore throat and chills. She has taken Augmentin in the past; she states that Z-pack has been historically ineffective. She states that the nasal sprays she has tried have been ineffective for her. Patient is still using Advair and has not had any problems with it. She has quit smoking since February. She has taken allergy shots for 20 years.  Patient works at a [employer].   Review of Systems  Constitutional: Positive for fatigue. Negative for fever and chills.  HENT: Positive for congestion, rhinorrhea and sneezing.   Eyes: Positive for discharge.  Respiratory: Positive for cough. Negative for shortness of breath.        Objective:  BP 130/72 mmHg  Pulse 82  Temp(Src) 98.2 F (36.8 C)  Resp 16  Ht 5' 2.5" (1.588 m)  Wt 153 lb (69.4 kg)  BMI 27.52 kg/m2  SpO2 96%  Physical Exam  Constitutional: She is oriented to person, place, and time. She appears well-developed and well-nourished. No distress.  HENT:  Head: Normocephalic and atraumatic.  Right Ear: Tympanic membrane normal.  Left Ear: A middle ear effusion is present.  Nose: Mucosal edema and rhinorrhea present.    Mouth/Throat: Oropharynx is clear and moist and mucous membranes are normal. No oropharyngeal exudate.  Nasal mucosal edema and clear rhinorrhea, left worse than right.   Eyes: Pupils are equal, round, and reactive to light.  Neck: Neck supple. No thyromegaly present.  Normal thyroid.   Cardiovascular: Normal rate, regular rhythm, S1 normal, S2 normal and normal heart sounds.   No murmur heard. Pulmonary/Chest: Effort normal and breath sounds normal. No respiratory distress. She has no wheezes. She has no rales.  Musculoskeletal: She exhibits no edema.  Lymphadenopathy:       Head (right side): No tonsillar adenopathy present.       Head (left side): No tonsillar adenopathy present.    She has no cervical adenopathy.       Right: No supraclavicular adenopathy present.       Left: No supraclavicular adenopathy present.  Neurological: She is alert and oriented to person, place, and time. No cranial nerve deficit.  Skin: Skin is warm and dry. No rash noted.  Psychiatric: She has a normal mood and affect. Her behavior is normal.  Nursing note and vitals reviewed.         Assessment & Plan:   Chronic pansinusitis - Plan: methylPREDNISolone sodium succinate (SOLU-MEDROL) 125 mg/2 mL injection 62.5 mg Mucinex, try netti pot, snap rx for augmentin given - pt will only fill if his sxs cont to worsen. Meds ordered this encounter  Medications  . predniSONE (  DELTASONE) 20 MG tablet    Sig: Take 3 tabs po qd x 3d, the 2 tabs po qd x 3d, then 1 tab po qd x 3d    Dispense:  18 tablet    Refill:  0  . montelukast (SINGULAIR) 10 MG tablet    Sig: Take 1 tablet (10 mg total) by mouth at bedtime.    Dispense:  30 tablet    Refill:  3  . levocetirizine (XYZAL) 5 MG tablet    Sig: Take 1 tablet (5 mg total) by mouth every evening.    Dispense:  30 tablet    Refill:  2  . amoxicillin-clavulanate (AUGMENTIN) 875-125 MG per tablet    Sig: Take 1 tablet by mouth 2 (two) times daily.     Dispense:  20 tablet    Refill:  0  . methylPREDNISolone sodium succinate (SOLU-MEDROL) 125 mg/2 mL injection 62.5 mg    Sig:     I personally performed the services described in this documentation, which was scribed in my presence. The recorded information has been reviewed and considered, and addended by me as needed.  Delman Cheadle, MD MPH

## 2014-09-16 NOTE — Patient Instructions (Signed)
Hot showers or breathing in steam may help loosen the congestion.  Using a netti pot or sinus rinse is also likely to help you feel better and keep this from progressing.  I recommend augmenting with generic mucinex to help you move out the congestion.  If no improvement or you are getting worse, come back but hopefully with all of the above, you can avoid it.  If you develop fever, chills, or the mucous that you are blowing out or coughing up changes color (green, brown, or blood) or you develop sinus pain in just 1 area - such as just the left cheek or ear - then go ahead and fill the augmentin antibiotic.  Be careful you don't pick up the flu or another virus while you are on the prednisone as it does suppress your immune system.  I recommend starting the singulair and xyzal every night (instead of any otc allergy meds such as claritin or allergra). If you would like you can split them up and take the xyzal in the morning but this is not necessary.  I recommend doing the netti pot at least every night but usually twice a day is best.   Sinusitis Sinusitis is redness, soreness, and inflammation of the paranasal sinuses. Paranasal sinuses are air pockets within the bones of your face (beneath the eyes, the middle of the forehead, or above the eyes). In healthy paranasal sinuses, mucus is able to drain out, and air is able to circulate through them by way of your nose. However, when your paranasal sinuses are inflamed, mucus and air can become trapped. This can allow bacteria and other germs to grow and cause infection. Sinusitis can develop quickly and last only a short time (acute) or continue over a long period (chronic). Sinusitis that lasts for more than 12 weeks is considered chronic.  CAUSES  Causes of sinusitis include:  Allergies.  Structural abnormalities, such as displacement of the cartilage that separates your nostrils (deviated septum), which can decrease the air flow through your nose and  sinuses and affect sinus drainage.  Functional abnormalities, such as when the small hairs (cilia) that line your sinuses and help remove mucus do not work properly or are not present. SIGNS AND SYMPTOMS  Symptoms of acute and chronic sinusitis are the same. The primary symptoms are pain and pressure around the affected sinuses. Other symptoms include:  Upper toothache.  Earache.  Headache.  Bad breath.  Decreased sense of smell and taste.  A cough, which worsens when you are lying flat.  Fatigue.  Fever.  Thick drainage from your nose, which often is green and may contain pus (purulent).  Swelling and warmth over the affected sinuses. DIAGNOSIS  Your health care provider will perform a physical exam. During the exam, your health care provider may:  Look in your nose for signs of abnormal growths in your nostrils (nasal polyps).  Tap over the affected sinus to check for signs of infection.  View the inside of your sinuses (endoscopy) using an imaging device that has a light attached (endoscope). If your health care provider suspects that you have chronic sinusitis, one or more of the following tests may be recommended:  Allergy tests.  Nasal culture. A sample of mucus is taken from your nose, sent to a lab, and screened for bacteria.  Nasal cytology. A sample of mucus is taken from your nose and examined by your health care provider to determine if your sinusitis is related to an allergy.  TREATMENT  Most cases of acute sinusitis are related to a viral infection and will resolve on their own within 10 days. Sometimes medicines are prescribed to help relieve symptoms (pain medicine, decongestants, nasal steroid sprays, or saline sprays).  However, for sinusitis related to a bacterial infection, your health care provider will prescribe antibiotic medicines. These are medicines that will help kill the bacteria causing the infection.  Rarely, sinusitis is caused by a fungal  infection. In theses cases, your health care provider will prescribe antifungal medicine. For some cases of chronic sinusitis, surgery is needed. Generally, these are cases in which sinusitis recurs more than 3 times per year, despite other treatments. HOME CARE INSTRUCTIONS   Drink plenty of water. Water helps thin the mucus so your sinuses can drain more easily.  Use a humidifier.  Inhale steam 3 to 4 times a day (for example, sit in the bathroom with the shower running).  Apply a warm, moist washcloth to your face 3 to 4 times a day, or as directed by your health care provider.  Use saline nasal sprays to help moisten and clean your sinuses.  Take medicines only as directed by your health care provider.  If you were prescribed either an antibiotic or antifungal medicine, finish it all even if you start to feel better. SEEK IMMEDIATE MEDICAL CARE IF:  You have increasing pain or severe headaches.  You have nausea, vomiting, or drowsiness.  You have swelling around your face.  You have vision problems.  You have a stiff neck.  You have difficulty breathing. MAKE SURE YOU:   Understand these instructions.  Will watch your condition.  Will get help right away if you are not doing well or get worse. Document Released: 09/22/2005 Document Revised: 02/06/2014 Document Reviewed: 10/07/2011 [facility] Patient Information 2015 [place], [place]. This information is not intended to replace advice given to you by your health care provider. Make sure you discuss any questions you have with your health care provider.

## 2014-09-29 ENCOUNTER — Other Ambulatory Visit: Payer: Self-pay | Admitting: Internal Medicine

## 2014-10-05 ENCOUNTER — Other Ambulatory Visit: Payer: Self-pay | Admitting: Internal Medicine

## 2014-10-09 ENCOUNTER — Other Ambulatory Visit: Payer: Self-pay | Admitting: *Deleted

## 2014-10-09 MED ORDER — CITALOPRAM HYDROBROMIDE 40 MG PO TABS
40.0000 mg | ORAL_TABLET | Freq: Every day | ORAL | Status: DC
Start: 2014-10-09 — End: 2015-01-02

## 2014-10-11 ENCOUNTER — Other Ambulatory Visit: Payer: Self-pay | Admitting: Physician Assistant

## 2014-10-24 ENCOUNTER — Encounter: Payer: Self-pay | Admitting: Family Medicine

## 2014-10-24 ENCOUNTER — Ambulatory Visit (INDEPENDENT_AMBULATORY_CARE_PROVIDER_SITE_OTHER): Payer: BC Managed Care – PPO | Admitting: Family Medicine

## 2014-10-24 ENCOUNTER — Ambulatory Visit (INDEPENDENT_AMBULATORY_CARE_PROVIDER_SITE_OTHER): Payer: BC Managed Care – PPO

## 2014-10-24 VITALS — BP 107/68 | HR 80 | Temp 97.6°F | Resp 16 | Ht 62.5 in | Wt 154.0 lb

## 2014-10-24 DIAGNOSIS — R05 Cough: Secondary | ICD-10-CM

## 2014-10-24 DIAGNOSIS — R059 Cough, unspecified: Secondary | ICD-10-CM

## 2014-10-24 DIAGNOSIS — R062 Wheezing: Secondary | ICD-10-CM

## 2014-10-24 DIAGNOSIS — J441 Chronic obstructive pulmonary disease with (acute) exacerbation: Secondary | ICD-10-CM

## 2014-10-24 DIAGNOSIS — J209 Acute bronchitis, unspecified: Secondary | ICD-10-CM

## 2014-10-24 DIAGNOSIS — J440 Chronic obstructive pulmonary disease with acute lower respiratory infection: Secondary | ICD-10-CM

## 2014-10-24 LAB — POCT CBC
Granulocyte percent: 64.8 %G (ref 37–80)
HCT, POC: 42.3 % (ref 37.7–47.9)
Hemoglobin: 14 g/dL (ref 12.2–16.2)
Lymph, poc: 2.1 (ref 0.6–3.4)
MCH, POC: 32.1 pg — AB (ref 27–31.2)
MCHC: 33.2 g/dL (ref 31.8–35.4)
MCV: 96.5 fL (ref 80–97)
MID (cbc): 0.5 (ref 0–0.9)
MPV: 6.8 fL (ref 0–99.8)
PLATELET COUNT, POC: 288 10*3/uL (ref 142–424)
POC GRANULOCYTE: 4.9 (ref 2–6.9)
POC LYMPH PERCENT: 28 %L (ref 10–50)
POC MID %: 7.2 %M (ref 0–12)
RBC: 4.38 M/uL (ref 4.04–5.48)
RDW, POC: 12.9 %
WBC: 7.6 10*3/uL (ref 4.6–10.2)

## 2014-10-24 MED ORDER — HYDROCOD POLST-CHLORPHEN POLST 10-8 MG/5ML PO LQCR: 5.0000 mL | Freq: Every evening | ORAL | Status: DC | PRN

## 2014-10-24 MED ORDER — IPRATROPIUM BROMIDE 0.02 % IN SOLN: 0.5000 mg | Freq: Once | RESPIRATORY_TRACT | Status: DC

## 2014-10-24 MED ORDER — ALBUTEROL SULFATE (2.5 MG/3ML) 0.083% IN NEBU: 2.5000 mg | INHALATION_SOLUTION | Freq: Once | RESPIRATORY_TRACT | Status: DC

## 2014-10-24 MED ORDER — PREDNISONE 20 MG PO TABS: ORAL_TABLET | ORAL | Status: DC

## 2014-10-24 NOTE — Progress Notes (Signed)
Subjective:    Patient ID: Kathryn Booker, female    DOB: Feb 15, 1951, 64 y.o.   MRN: [id number]  HPI Patient presents today with returning cough and tightness. Cough occasionally productive with small amounts sputum, that doesn't have much color. She has been using her albuterol inhaler with some short term relief.   She moved in to her new house 11/15. She has had itchy eyes, runny nose since then. She has chronic sinus issues. Occasional left sided sinus pressure.  She has restarted her singulair and OTC antihistamine. Added cool mist humidifier.   Had albuterol 2 puffs this morning.   Sees Vicie Mutters for primary care. Has never been to pulmonary for management of COPD/allergies.   Review of Systems No fever, no chills, no headaches, feels SOB and chest feels tight    Objective:   Physical Exam  Constitutional: She is oriented to person, place, and time. She appears well-developed and well-nourished. No distress.  HENT:  Head: Normocephalic and atraumatic.  Right Ear: Tympanic membrane, external ear and ear canal normal.  Left Ear: Tympanic membrane, external ear and ear canal normal.  Nose: Mucosal edema and rhinorrhea present.  Mouth/Throat: Uvula is midline, oropharynx is clear and moist and mucous membranes are normal.  Eyes: Right conjunctiva is injected. Left conjunctiva is injected.  Neck: Normal range of motion. Neck supple.  Cardiovascular: Normal rate, regular rhythm and normal heart sounds.   Pulmonary/Chest: Effort normal and breath sounds normal. No respiratory distress. She has no wheezes. She has no rales.  Frequent,nonproductive cough  Musculoskeletal: Normal range of motion.  Neurological: She is alert and oriented to person, place, and time.  Skin: Skin is warm and dry. She is not diaphoretic.  Psychiatric: She has a normal mood and affect. Her behavior is normal. Judgment and thought content normal.  Vitals reviewed.  CXR- [facility] reading (PRIMARY) by   Dr. 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  Please comment on left lower lobe on lateral. AP diameter consistent with COPD changes.  Peak flow pre albuterol/atrovent neb treatment- 320, 340, post- 450 Results for orders placed or performed in visit on 10/24/14  POCT CBC  Result Value Ref Range   WBC 7.6 4.6 - 10.2 K/uL   Lymph, poc 2.1 0.6 - 3.4   POC LYMPH PERCENT 28.0 10 - 50 %L   MID (cbc) 0.5 0 - 0.9   POC MID % 7.2 0 - 12 %M   POC Granulocyte 4.9 2 - 6.9   Granulocyte percent 64.8 37 - 80 %G   RBC 4.38 4.04 - 5.48 M/uL   Hemoglobin 14.0 12.2 - 16.2 g/dL   HCT, POC 42.3 37.7 - 47.9 %   MCV 96.5 80 - 97 fL   MCH, POC 32.1 (A) 27 - 31.2 pg   MCHC 33.2 31.8 - 35.4 g/dL   RDW, POC 12.9 %   Platelet Count, POC 288 142 - 424 K/uL   MPV 6.8 0 - 99.8 fL       Assessment & Plan:  1. Cough - DG Chest 2 View; Future - POCT CBC - albuterol (PROVENTIL) (2.5 MG/3ML) 0.083% nebulizer solution 2.5 mg; Take 3 mLs (2.5 mg total) by nebulization once. - ipratropium (ATROVENT) nebulizer solution 0.5 mg; Take 2.5 mLs (0.5 mg total) by nebulization once. - chlorpheniramine-HYDROcodone (TUSSIONEX PENNKINETIC ER) 10-8 MG/5ML LQCR; Take 5 mLs by mouth at bedtime as needed.  Dispense: 70 mL; Refill: 0 - Discussed referral to pulmonary with  patient. She is not interested at this time, but will consider for the future - She was instructed to return to clinic if she has worsening symptoms- fever/wheeze/SOB/cough  2. COPD (chronic obstructive pulmonary disease) with acute bronchitis - POCT CBC - albuterol (PROVENTIL) (2.5 MG/3ML) 0.083% nebulizer solution 2.5 mg; Take 3 mLs (2.5 mg total) by nebulization once. - ipratropium (ATROVENT) nebulizer solution 0.5 mg; Take 2.5 mLs (0.5 mg total) by nebulization once. - predniSONE (DELTASONE) 20 MG tablet; Take 3,3,3,2,2,2,1,1,1  Dispense: 18 tablet; Refill: 0  3. Wheeze - POCT CBC - albuterol (PROVENTIL) (2.5 MG/3ML) 0.083%  nebulizer solution 2.5 mg; Take 3 mLs (2.5 mg total) by nebulization once. - ipratropium (ATROVENT) nebulizer solution 0.5 mg; Take 2.5 mLs (0.5 mg total) by nebulization once.  4. Allergic rhinitis -Qnasal nasal spray sample provided to patient. Two sprays in each nostril once a day. She will let me know if this helps and I will provide a prescription.   Elby Beck, FNP-BC  [facility]  10/27/2014 11:27 AM

## 2014-10-29 ENCOUNTER — Encounter: Payer: Self-pay | Admitting: Family Medicine

## 2014-11-01 ENCOUNTER — Other Ambulatory Visit: Payer: Self-pay | Admitting: Emergency Medicine

## 2014-11-22 ENCOUNTER — Other Ambulatory Visit (HOSPITAL_COMMUNITY): Payer: Self-pay | Admitting: Gynecology

## 2014-11-22 DIAGNOSIS — Z1231 Encounter for screening mammogram for malignant neoplasm of breast: Secondary | ICD-10-CM

## 2014-12-06 ENCOUNTER — Other Ambulatory Visit: Payer: Self-pay | Admitting: Internal Medicine

## 2014-12-12 ENCOUNTER — Ambulatory Visit (HOSPITAL_COMMUNITY): Payer: BC Managed Care – PPO

## 2014-12-19 ENCOUNTER — Other Ambulatory Visit: Payer: Self-pay | Admitting: Family Medicine

## 2014-12-21 ENCOUNTER — Ambulatory Visit (HOSPITAL_COMMUNITY)
Admission: RE | Admit: 2014-12-21 | Discharge: 2014-12-21 | Disposition: A | Discharge status: Home / Self Care | Payer: BC Managed Care – PPO | Source: Ambulatory Visit | Attending: Gynecology | Admitting: Gynecology

## 2014-12-21 DIAGNOSIS — Z1231 Encounter for screening mammogram for malignant neoplasm of breast: Secondary | ICD-10-CM | POA: Diagnosis present

## 2014-12-22 ENCOUNTER — Other Ambulatory Visit: Payer: Self-pay | Admitting: Gynecology

## 2014-12-22 DIAGNOSIS — R928 Other abnormal and inconclusive findings on diagnostic imaging of breast: Secondary | ICD-10-CM

## 2014-12-28 ENCOUNTER — Ambulatory Visit
Admission: RE | Admit: 2014-12-28 | Discharge: 2014-12-28 | Disposition: A | Discharge status: Home / Self Care | Payer: BC Managed Care – PPO | Source: Ambulatory Visit | Attending: Gynecology | Admitting: Gynecology

## 2014-12-28 DIAGNOSIS — R928 Other abnormal and inconclusive findings on diagnostic imaging of breast: Secondary | ICD-10-CM

## 2015-01-02 ENCOUNTER — Other Ambulatory Visit: Payer: Self-pay | Admitting: Internal Medicine

## 2015-01-19 ENCOUNTER — Other Ambulatory Visit: Payer: Self-pay | Admitting: Family Medicine

## 2015-01-29 ENCOUNTER — Encounter: Payer: Self-pay | Admitting: Emergency Medicine

## 2015-04-09 ENCOUNTER — Other Ambulatory Visit: Payer: Self-pay | Admitting: Internal Medicine

## 2015-04-26 ENCOUNTER — Encounter: Payer: Self-pay | Admitting: Physician Assistant

## 2015-05-02 ENCOUNTER — Ambulatory Visit (INDEPENDENT_AMBULATORY_CARE_PROVIDER_SITE_OTHER): Payer: BC Managed Care – PPO | Admitting: Physician Assistant

## 2015-05-02 ENCOUNTER — Encounter: Payer: Self-pay | Admitting: Physician Assistant

## 2015-05-02 VITALS — BP 112/70 | HR 72 | Temp 98.2°F | Resp 18 | Ht 63.0 in | Wt 151.0 lb

## 2015-05-02 DIAGNOSIS — M8580 Other specified disorders of bone density and structure, unspecified site: Secondary | ICD-10-CM

## 2015-05-02 DIAGNOSIS — M419 Scoliosis, unspecified: Secondary | ICD-10-CM

## 2015-05-02 DIAGNOSIS — Z0001 Encounter for general adult medical examination with abnormal findings: Secondary | ICD-10-CM

## 2015-05-02 DIAGNOSIS — I452 Bifascicular block: Secondary | ICD-10-CM

## 2015-05-02 DIAGNOSIS — E039 Hypothyroidism, unspecified: Secondary | ICD-10-CM

## 2015-05-02 DIAGNOSIS — J4520 Mild intermittent asthma, uncomplicated: Secondary | ICD-10-CM

## 2015-05-02 DIAGNOSIS — F419 Anxiety disorder, unspecified: Secondary | ICD-10-CM

## 2015-05-02 DIAGNOSIS — F32A Depression, unspecified: Secondary | ICD-10-CM

## 2015-05-02 DIAGNOSIS — F329 Major depressive disorder, single episode, unspecified: Secondary | ICD-10-CM

## 2015-05-02 DIAGNOSIS — E782 Mixed hyperlipidemia: Secondary | ICD-10-CM

## 2015-05-02 DIAGNOSIS — J449 Chronic obstructive pulmonary disease, unspecified: Secondary | ICD-10-CM

## 2015-05-02 DIAGNOSIS — Z79899 Other long term (current) drug therapy: Secondary | ICD-10-CM

## 2015-05-02 DIAGNOSIS — I10 Essential (primary) hypertension: Secondary | ICD-10-CM

## 2015-05-02 DIAGNOSIS — Z0000 Encounter for general adult medical examination without abnormal findings: Secondary | ICD-10-CM

## 2015-05-02 DIAGNOSIS — E559 Vitamin D deficiency, unspecified: Secondary | ICD-10-CM

## 2015-05-02 DIAGNOSIS — R7309 Other abnormal glucose: Secondary | ICD-10-CM | POA: Insufficient documentation

## 2015-05-02 DIAGNOSIS — F17200 Nicotine dependence, unspecified, uncomplicated: Secondary | ICD-10-CM

## 2015-05-02 DIAGNOSIS — D649 Anemia, unspecified: Secondary | ICD-10-CM

## 2015-05-02 DIAGNOSIS — R7303 Prediabetes: Secondary | ICD-10-CM

## 2015-05-02 DIAGNOSIS — Z9189 Other specified personal risk factors, not elsewhere classified: Secondary | ICD-10-CM

## 2015-05-02 LAB — CBC WITH DIFFERENTIAL/PLATELET
Basophils Absolute: 0.1 10*3/uL (ref 0.0–0.1)
Basophils Relative: 1 % (ref 0–1)
Eosinophils Absolute: 0.2 10*3/uL (ref 0.0–0.7)
Eosinophils Relative: 3 % (ref 0–5)
HEMATOCRIT: 41.5 % (ref 36.0–46.0)
HEMOGLOBIN: 14.4 g/dL (ref 12.0–15.0)
LYMPHS ABS: 1.5 10*3/uL (ref 0.7–4.0)
LYMPHS PCT: 29 % (ref 12–46)
MCH: 33.1 pg (ref 26.0–34.0)
MCHC: 34.7 g/dL (ref 30.0–36.0)
MCV: 95.4 fL (ref 78.0–100.0)
MPV: 9.1 fL (ref 8.6–12.4)
Monocytes Absolute: 0.3 10*3/uL (ref 0.1–1.0)
Monocytes Relative: 6 % (ref 3–12)
NEUTROS ABS: 3.2 10*3/uL (ref 1.7–7.7)
Neutrophils Relative %: 61 % (ref 43–77)
Platelets: 220 10*3/uL (ref 150–400)
RBC: 4.35 MIL/uL (ref 3.87–5.11)
RDW: 13.8 % (ref 11.5–15.5)
WBC: 5.3 10*3/uL (ref 4.0–10.5)

## 2015-05-02 LAB — HEMOGLOBIN A1C
HEMOGLOBIN A1C: 6.1 % — AB (ref <5.7)
MEAN PLASMA GLUCOSE: 128 mg/dL — AB (ref <117)

## 2015-05-02 NOTE — Patient Instructions (Addendum)
Encourage you to get the 3D Mammogram  The 3D Mammogram is much more specific and sensitive to pick up breast cancer. For women with fibrocystic breast or lumpy breast it can be hard to determine if it is cancer or not but the 3D mammogram is able to tell this difference which cuts back on unneeded additional tests or scary call backs. Screening is covered, and it is usually an extra cost for the 3D option but only 50-75 dollars.    Common causes of cough OR hoarseness OR sore throat:   Allergies, Viral Infections, Acid Reflux and Bacterial Infections.  1) Allergies and viral infections cause a cough OR sore throat by post nasal drip and are often worse at night, can also have sneezing, lower grade fevers, clear/yellow mucus. This is best treated with allergy medications or nasal sprays.  Please get on allegra for 1-2 weeks The strongest is allegra or fexafinadine  Cheapest at [employer], [employer], [employer]  2) Bacterial infections are more severe than allergies or viral infections with fever, teeth pain, fatigue. This can be treated with prednisone and the same over the counter medication and after 7 days can be treated with an antibiotic.   3) Silent reflux/GERD can cause a cough OR sore throat OR hoarseness WITHOUT heart burn because the esophagus that goes to the stomach and trachea that goes to the lungs are very close and when you lay down the acid can irritate your throat and lungs. This can cause hoarseness, cough, and wheezing. Please stop any alcohol or anti-inflammatories like aleve/advil/ibuprofen and start an over the counter Prilosec or omeprazole 1-2 times daily 53mns before food for 2 weeks, then switch to over the counter zantac/ratinidine or pepcid/famotadine once at night for 2 weeks.   4) sometimes irritation causes more irritation. Try voice rest, use sugar free cough drops to prevent coughing, and try to stop clearing your throat.   If you ever have a cough that does not go away  after trying these things please make a follow up visit for further evaluation or we can refer you to a specialist. Or if you ever have shortness of breath or chest pain go to the ER.    Preventive Care for Adults  A healthy lifestyle and preventive care can promote health and wellness. Preventive health guidelines for women include the following key practices.  A routine yearly physical is a good way to check with your health care provider about your health and preventive screening. It is a chance to share any concerns and updates on your health and to receive a thorough exam.  Visit your dentist for a routine exam and preventive care every 6 months. Brush your teeth twice a day and floss once a day. Good oral hygiene prevents tooth decay and gum disease.  The frequency of eye exams is based on your age, health, family medical history, use of contact lenses, and other factors. Follow your health care provider's recommendations for frequency of eye exams.  Eat a healthy diet. Foods like vegetables, fruits, whole grains, low-fat dairy products, and lean protein foods contain the nutrients you need without too many calories. Decrease your intake of foods high in solid fats, added sugars, and salt. Eat the right amount of calories for you.Get information about a proper diet from your health care provider, if necessary.  Regular physical exercise is one of the most important things you can do for your health. Most adults should get at least 150 minutes of moderate-intensity  exercise (any activity that increases your heart rate and causes you to sweat) each week. In addition, most adults need muscle-strengthening exercises on 2 or more days a week.  Maintain a healthy weight. The body mass index (BMI) is a screening tool to identify possible weight problems. It provides an estimate of body fat based on height and weight. Your health care provider can find your BMI and can help you achieve or maintain a  healthy weight.For adults 20 years and older:  A BMI below 18.5 is considered underweight.  A BMI of 18.5 to 24.9 is normal.  A BMI of 25 to 29.9 is considered overweight.  A BMI of 30 and above is considered obese.  Maintain normal blood lipids and cholesterol levels by exercising and minimizing your intake of saturated fat. Eat a balanced diet with plenty of fruit and vegetables. Blood tests for lipids and cholesterol should begin at age 67 and be repeated every 5 years. If your lipid or cholesterol levels are high, you are over 50, or you are at high risk for heart disease, you may need your cholesterol levels checked more frequently.Ongoing high lipid and cholesterol levels should be treated with medicines if diet and exercise are not working.  If you smoke, find out from your health care provider how to quit. If you do not use tobacco, do not start.  Lung cancer screening is recommended for adults aged 49-80 years who are at high risk for developing lung cancer because of a history of smoking. A yearly low-dose CT scan of the lungs is recommended for people who have at least a 30-pack-year history of smoking and are a current smoker or have quit within the past 15 years. A pack year of smoking is smoking an average of 1 pack of cigarettes a day for 1 year (for example: 1 pack a day for 30 years or 2 packs a day for 15 years). Yearly screening should continue until the smoker has stopped smoking for at least 15 years. Yearly screening should be stopped for people who develop a health problem that would prevent them from having lung cancer treatment.  High blood pressure causes heart disease and increases the risk of stroke. Your blood pressure should be checked at least every 1 to 2 years. Ongoing high blood pressure should be treated with medicines if weight loss and exercise do not work.  If you are 4-36 years old, ask your health care provider if you should take aspirin to prevent  strokes.  Diabetes screening involves taking a blood sample to check your fasting blood sugar level. This should be done once every 3 years, after age 11, if you are within normal weight and without risk factors for diabetes. Testing should be considered at a younger age or be carried out more frequently if you are overweight and have at least 1 risk factor for diabetes.  Breast cancer screening is essential preventive care for women. You should practice "breast self-awareness." This means understanding the normal appearance and feel of your breasts and may include breast self-examination. Any changes detected, no matter how small, should be reported to a health care provider. Women in their 35s and 30s should have a clinical breast exam (CBE) by a health care provider as part of a regular health exam every 1 to 3 years. After age 34, women should have a CBE every year. Starting at age 19, women should consider having a mammogram (breast X-ray test) every year. Women who have  a family history of breast cancer should talk to their health care provider about genetic screening. Women at a high risk of breast cancer should talk to their health care providers about having an MRI and a mammogram every year.  Breast cancer gene (BRCA)-related cancer risk assessment is recommended for women who have family members with BRCA-related cancers. BRCA-related cancers include breast, ovarian, tubal, and peritoneal cancers. Having family members with these cancers may be associated with an increased risk for harmful changes (mutations) in the breast cancer genes BRCA1 and BRCA2. Results of the assessment will determine the need for genetic counseling and BRCA1 and BRCA2 testing.  Routine pelvic exams to screen for cancer are no longer recommended for nonpregnant women who are considered low risk for cancer of the pelvic organs (ovaries, uterus, and vagina) and who do not have symptoms. Ask your health care provider if a  screening pelvic exam is right for you.  If you have had past treatment for cervical cancer or a condition that could lead to cancer, you need Pap tests and screening for cancer for at least 20 years after your treatment. If Pap tests have been discontinued, your risk factors (such as having a new sexual partner) need to be reassessed to determine if screening should be resumed. Some women have medical problems that increase the chance of getting cervical cancer. In these cases, your health care provider may recommend more frequent screening and Pap tests.  Colorectal cancer can be detected and often prevented. Most routine colorectal cancer screening begins at the age of 107 years and continues through age 67 years. However, your health care provider may recommend screening at an earlier age if you have risk factors for colon cancer. On a yearly basis, your health care provider may provide home test kits to check for hidden blood in the stool. Use of a small camera at the end of a tube, to directly examine the colon (sigmoidoscopy or colonoscopy), can detect the earliest forms of colorectal cancer. Talk to your health care provider about this at age 51, when routine screening begins. Direct exam of the colon should be repeated every 5-10 years through age 58 years, unless early forms of pre-cancerous polyps or small growths are found.  Hepatitis C blood testing is recommended for all people born from [birth year] through 1964 and any individual with known risks for hepatitis C.  Pra  Osteoporosis is a disease in which the bones lose minerals and strength with aging. This can result in serious bone fractures or breaks. The risk of osteoporosis can be identified using a bone density scan. Women ages 54 years and over and women at risk for fractures or osteoporosis should discuss screening with their health care providers. Ask your health care provider whether you should take a calcium supplement or vitamin D to  reduce the rate of osteoporosis.  Menopause can be associated with physical symptoms and risks. Hormone replacement therapy is available to decrease symptoms and risks. You should talk to your health care provider about whether hormone replacement therapy is right for you.  Use sunscreen. Apply sunscreen liberally and repeatedly throughout the day. You should seek shade when your shadow is shorter than you. Protect yourself by wearing long sleeves, pants, a wide-brimmed hat, and sunglasses year round, whenever you are outdoors.  Once a month, do a whole body skin exam, using a mirror to look at the skin on your back. Tell your health care provider of new moles, moles that have  irregular borders, moles that are larger than a pencil eraser, or moles that have changed in shape or color.  Stay current with required vaccines (immunizations).  Influenza vaccine. All adults should be immunized every year.  Tetanus, diphtheria, and acellular pertussis (Td, Tdap) vaccine. Pregnant women should receive 1 dose of Tdap vaccine during each pregnancy. The dose should be obtained regardless of the length of time since the last dose. Immunization is preferred during the 27th-36th week of gestation. An adult who has not previously received Tdap or who does not know her vaccine status should receive 1 dose of Tdap. This initial dose should be followed by tetanus and diphtheria toxoids (Td) booster doses every 10 years. Adults with an unknown or incomplete history of completing a 3-dose immunization series with Td-containing vaccines should begin or complete a primary immunization series including a Tdap dose. Adults should receive a Td booster every 10 years.  Varicella vaccine. An adult without evidence of immunity to varicella should receive 2 doses or a second dose if she has previously received 1 dose. Pregnant females who do not have evidence of immunity should receive the first dose after pregnancy. This first  dose should be obtained before leaving the health care facility. The second dose should be obtained 4-8 weeks after the first dose.  Human papillomavirus (HPV) vaccine. Females aged 13-26 years who have not received the vaccine previously should obtain the 3-dose series. The vaccine is not recommended for use in pregnant females. However, pregnancy testing is not needed before receiving a dose. If a female is found to be pregnant after receiving a dose, no treatment is needed. In that case, the remaining doses should be delayed until after the pregnancy. Immunization is recommended for any person with an immunocompromised condition through the age of 71 years if she did not get any or all doses earlier. During the 3-dose series, the second dose should be obtained 4-8 weeks after the first dose. The third dose should be obtained 24 weeks after the first dose and 16 weeks after the second dose.  Zoster vaccine. One dose is recommended for adults aged 46 years or older unless certain conditions are present.  Measles, mumps, and rubella (MMR) vaccine. Adults born before [birth year] generally are considered immune to measles and mumps. Adults born in [birth year] or later should have 1 or more doses of MMR vaccine unless there is a contraindication to the vaccine or there is laboratory evidence of immunity to each of the three diseases. A routine second dose of MMR vaccine should be obtained at least 28 days after the first dose for students attending postsecondary schools, health care workers, or international travelers. People who received inactivated measles vaccine or an unknown type of measles vaccine during 1963-1967 should receive 2 doses of MMR vaccine. People who received inactivated mumps vaccine or an unknown type of mumps vaccine before 1979 and are at high risk for mumps infection should consider immunization with 2 doses of MMR vaccine. For females of childbearing age, rubella immunity should be determined. If there  is no evidence of immunity, females who are not pregnant should be vaccinated. If there is no evidence of immunity, females who are pregnant should delay immunization until after pregnancy. Unvaccinated health care workers born before [birth year] who lack laboratory evidence of measles, mumps, or rubella immunity or laboratory confirmation of disease should consider measles and mumps immunization with 2 doses of MMR vaccine or rubella immunization with 1 dose of MMR vaccine.  Pneumococcal  13-valent conjugate (PCV13) vaccine. When indicated, a person who is uncertain of her immunization history and has no record of immunization should receive the PCV13 vaccine. An adult aged 48 years or older who has certain medical conditions and has not been previously immunized should receive 1 dose of PCV13 vaccine. This PCV13 should be followed with a dose of pneumococcal polysaccharide (PPSV23) vaccine. The PPSV23 vaccine dose should be obtained at least 8 weeks after the dose of PCV13 vaccine. An adult aged 45 years or older who has certain medical conditions and previously received 1 or more doses of PPSV23 vaccine should receive 1 dose of PCV13. The PCV13 vaccine dose should be obtained 1 or more years after the last PPSV23 vaccine dose.    Pneumococcal polysaccharide (PPSV23) vaccine. When PCV13 is also indicated, PCV13 should be obtained first. All adults aged 33 years and older should be immunized. An adult younger than age 84 years who has certain medical conditions should be immunized. Any person who resides in a nursing home or long-term care facility should be immunized. An adult smoker should be immunized. People with an immunocompromised condition and certain other conditions should receive both PCV13 and PPSV23 vaccines. People with human immunodeficiency virus (HIV) infection should be immunized as soon as possible after diagnosis. Immunization during chemotherapy or radiation therapy should be avoided. Routine use  of PPSV23 vaccine is not recommended for American Indians, [place]atives, or people younger than 65 years unless there are medical conditions that require PPSV23 vaccine. When indicated, people who have unknown immunization and have no record of immunization should receive PPSV23 vaccine. One-time revaccination 5 years after the first dose of PPSV23 is recommended for people aged 19-64 years who have chronic kidney failure, nephrotic syndrome, asplenia, or immunocompromised conditions. People who received 1-2 doses of PPSV23 before age 6 years should receive another dose of PPSV23 vaccine at age 23 years or later if at least 5 years have passed since the previous dose. Doses of PPSV23 are not needed for people immunized with PPSV23 at or after age 25 years.  Preventive Services / Frequency   Ages 51 to 41 years  Blood pressure check.  Lipid and cholesterol check.  Lung cancer screening. / Every year if you are aged 13-80 years and have a 30-pack-year history of smoking and currently smoke or have quit within the past 15 years. Yearly screening is stopped once you have quit smoking for at least 15 years or develop a health problem that would prevent you from having lung cancer treatment.  Clinical breast exam.** / Every year after age 48 years.  BRCA-related cancer risk assessment.** / For women who have family members with a BRCA-related cancer (breast, ovarian, tubal, or peritoneal cancers).  Mammogram.** / Every year beginning at age 31 years and continuing for as long as you are in good health. Consult with your health care provider.  Pap test.** / Every 3 years starting at age 46 years through age 60 or 8 years with a history of 3 consecutive normal Pap tests.  HPV screening.** / Every 3 years from ages 17 years through ages 36 to 51 years with a history of 3 consecutive normal Pap tests.  Fecal occult blood test (FOBT) of stool. / Every year beginning at age 4 years and continuing  until age 45 years. You may not need to do this test if you get a colonoscopy every 10 years.  Flexible sigmoidoscopy or colonoscopy.** / Every 5 years for a flexible  sigmoidoscopy or every 10 years for a colonoscopy beginning at age 58 years and continuing until age 67 years.  Hepatitis C blood test.** / For all people born from [birth year] through 1964 and any individual with known risks for hepatitis C.  Skin self-exam. / Monthly.  Influenza vaccine. / Every year.  Tetanus, diphtheria, and acellular pertussis (Tdap/Td) vaccine.** / Consult your health care provider. Pregnant women should receive 1 dose of Tdap vaccine during each pregnancy. 1 dose of Td every 10 years.  Varicella vaccine.** / Consult your health care provider. Pregnant females who do not have evidence of immunity should receive the first dose after pregnancy.  Zoster vaccine.** / 1 dose for adults aged 60 years or older.  Pneumococcal 13-valent conjugate (PCV13) vaccine.** / Consult your health care provider.  Pneumococcal polysaccharide (PPSV23) vaccine.** / 1 to 2 doses if you smoke cigarettes or if you have certain conditions.  Meningococcal vaccine.** / Consult your health care provider.  Hepatitis A vaccine.** / Consult your health care provider.  Hepatitis B vaccine.** / Consult your health care provider. Screening for abdominal aortic aneurysm (AAA)  by ultrasound is recommended for people over 50 who have history of high blood pressure or who are current or former smokers.

## 2015-05-02 NOTE — Progress Notes (Signed)
Complete Physical  Assessment and Plan: 1. Hypothyroidism, unspecified hypothyroidism type Hypothyroidism-check TSH level, continue medications the same, reminded to take on an empty stomach 30-59mins before food.  - TSH  2. Hyperlipidemia -continue medications, check lipids, decrease fatty foods, increase activity.  - CBC with Differential/Platelet - BASIC METABOLIC PANEL WITH GFR - Hepatic function panel - Lipid panel - Urinalysis, Routine w reflex microscopic (not at [facility]) - Microalbumin / creatinine urine ratio - EKG 12-Lead - [place], RETROPERITNL ABD,  LTD  3. Depression Depression- continue medications, stress management techniques discussed, increase water, good sleep hygiene discussed, increase exercise, and increase veggies.   4. History of smoking  Has quit, will get EKG, and monitor CXR.   5. Chronic obstructive pulmonary disease, unspecified COPD, unspecified chronic bronchitis type Monitor   6. Asthma, mild intermittent, uncomplicated Monitor.   7. Vitamin D deficiency - Vit D  25 hydroxy (rtn osteoporosis monitoring)  8. At risk for coronary artery disease Control blood pressure, cholesterol, glucose, increase exercise.   9. Anxiety continue medications, stress management techniques discussed, increase water, good sleep hygiene discussed, increase exercise, and increase veggies.   10. Scoliosis monitor  11. RBBB (right bundle branch block with left anterior fascicular block) monitor  12. Prediabetes Discussed general issues about diabetes pathophysiology and management., Educational material distributed., Suggested low cholesterol diet., Encouraged aerobic exercise., Discussed foot care., Reminded to get yearly retinal exam. - Hemoglobin A1c - Insulin, fasting - LOW EXTREMITY NEUR EXAM DOCUM  13. Encounter for general adult medical examination with abnormal findings - Magnesium  14. Anemia, unspecified anemia type - Iron and TIBC - Ferritin - Vitamin  B12  15. Osteopenia - DG Bone Density; Future  Discussed med's effects and SE's. Screening labs and tests as requested with regular follow-up as recommended. Over 40 minutes of exam, counseling, chart review, and complex, high level critical decision making was performed this visit.   HPI  64 y.o. female  presents for a complete physical.  Her blood pressure has been controlled at home, today their BP is BP: 112/70 mmHg She does not workout, trying to walk. She denies chest pain, shortness of breath, dizziness.  She is on cholesterol medication due to myalgias with statins, but has tolerated livalo.  Her cholesterol is at goal. The cholesterol last visit was:   Lab Results  Component Value Date   CHOL 199 05/18/2014   HDL 43 05/18/2014   LDLCALC 86 05/18/2014   TRIG 348* 05/18/2014   CHOLHDL 4.6 05/18/2014   She has been working on diet and exercise for prediabetes,  and denies paresthesia of the feet, polydipsia, polyuria and visual disturbances. Last A1C in the office was:  Lab Results  Component Value Date   HGBA1C 5.9* 05/18/2014   Patient is on Vitamin D supplement, 1000 IU daily   Lab Results  Component Value Date   VD25OH 14 05/18/2014     She is on celexa and wellbutrin for anxiety that helps.  She quit smoking Feb 2015, she smoked for 40 + years, last CXR Jan 2016.  She has bilateral dry eyes, has seen her eye doctor.  She has cough with clear sputum, she is on allergy meds, does have GERD.   Current Medications:  Current Outpatient Prescriptions on File Prior to Visit  Medication Sig Dispense Refill  . ADVAIR DISKUS 250-50 MCG/DOSE AEPB INHALE 1 INHALATION BY MOUTH 2 TIMES DAILY, RINSE MOUTH AFTER INHALATION 180 each 0  . buPROPion (WELLBUTRIN XL) 300 MG 24  hr tablet TAKE 1 TABLET BY MOUTH EVERY DAY 90 tablet 1  . citalopram (CELEXA) 40 MG tablet TAKE 1 TABLET (40 MG TOTAL) BY MOUTH DAILY. 90 tablet 1  . GuaiFENesin (MUCINEX PO) Take by mouth as needed.    [name]  levocetirizine (XYZAL) 5 MG tablet Take 1 tablet (5 mg total) by mouth every evening. 30 tablet 9  . LIVALO 4 MG TABS TAKE 1 TABLET BY MOUTH AT BEDTIME 30 tablet 0  . Magnesium 250 MG TABS Take by mouth daily.    [name] OVER THE COUNTER MEDICATION Eye drop 1 drop each eye daily.    . valACYclovir (VALTREX) 500 MG tablet Take 500 mg by mouth daily.      . VENTOLIN HFA 108 (90 BASE) MCG/ACT inhaler INHALE 2 PUFFS EVERY 4 HOURS AS NEEDED 18 Inhaler 0  . montelukast (SINGULAIR) 10 MG tablet TAKE 1 TABLET (10 MG TOTAL) BY MOUTH AT BEDTIME. (Patient not taking: Reported on 05/02/2015) 30 tablet 2   Current Facility-Administered Medications on File Prior to Visit  Medication Dose Route Frequency Provider Last Rate Last Dose  . ipratropium (ATROVENT) nebulizer solution 0.5 mg  0.5 mg Nebulization Once Elby Beck, FNP       Health Maintenance:   Immunization History  Administered Date(s) Administered  . PPD Test 01/26/2014  . Pneumococcal-Unspecified 12/25/2011  . Td 07/22/2006  . Zoster 01/26/2014   Tdap:07/22/2006 due next year Pneumovax:12/25/11 Prevnar 13 Due at age 74 Zostavax:01/26/14 Influenza: free at work 2015  Colonoscopy:2008 due 2018 Mammo: 12/2014 CAT C, had [place] left breast benign BMD: 2012 osteopenia at gyn Pap/ Pelvic: Nov 2015  At gYN CXR: 10/2014 Ct cardiac score 2013 CT lumbar 2011 Echo 2012  [id number] Dentist:q 6 months Patient Care Team: Unk Pinto, MD as PCP - General (Internal Medicine) Delila Pereyra, MD as Consulting Physician (Gynecology) Royston Cowper, DDS (Dentistry) Josue Hector, MD as Consulting Physician (Cardiology) Inda Castle, MD as Consulting Physician (Gastroenterology) Lavonna Monarch, MD as Consulting Physician (Dermatology) Jacelyn Pi, MD as Consulting Physician (Endocrinology) Armandina Gemma, MD as Consulting Physician (General Surgery)  Allergies:  Allergies  Allergen Reactions  . Lipitor [Atorvastatin]     Myalgias    Medical  History:  Past Medical History  Diagnosis Date  . Serum calcium elevated   . Anxiety   . Elevated cholesterol   . RBBB (right bundle branch block with left anterior fascicular block)   . Thyroid disease   . Fatigue   . Depression   . Asthma   . COPD (chronic obstructive pulmonary disease)   . Scoliosis    Surgical History:  Past Surgical History  Procedure Laterality Date  . Tonsilectomy, adenoidectomy, bilateral myringotomy and tubes    . Ovarian cyst removal    . Appendectomy    . Laproscopic      X 2 IN EARLY 30-40'S   Family History:  Family History  Problem Relation Age of Onset  . Heart failure Mother   . Hypertension Mother   . Hyperlipidemia Mother   . Heart disease Mother   . Liver disease Father   . Alcohol abuse Father    Social History:  History  Substance Use Topics  . Smoking status: Former Smoker -- 1.00 packs/day    Types: Cigarettes    Quit date: 11/19/2013  . Smokeless tobacco: Not on file  . Alcohol Use: 0.0 oz/week    0 Standard drinks or equivalent per week     Comment: social  Review of Systems: Review of Systems  Constitutional: Negative.   HENT: Positive for congestion. Negative for ear discharge, ear pain, hearing loss, nosebleeds, sore throat and tinnitus.   Respiratory: Positive for cough. Negative for hemoptysis, sputum production, shortness of breath, wheezing and stridor.   Gastrointestinal: Positive for heartburn. Negative for nausea, vomiting, abdominal pain, diarrhea, constipation, blood in stool and melena.  Genitourinary: Positive for urgency and frequency. Negative for dysuria, hematuria and flank pain.       + incontinence  Musculoskeletal: Negative.   Skin: Negative.   Neurological: Negative.  Negative for headaches.  Psychiatric/Behavioral: Negative.     Physical Exam: Estimated body mass index is 26.76 kg/(m^2) as calculated from the following:   Height as of this encounter: 5\' 3"  (1.6 m).   Weight as of this  encounter: 151 lb (68.493 kg). BP 112/70 mmHg  Pulse 72  Temp(Src) 98.2 F (36.8 C) (Temporal)  Resp 18  Ht 5\' 3"  (1.6 m)  Wt 151 lb (68.493 kg)  BMI 26.76 kg/m2 General Appearance: Well nourished, in no apparent distress.  Eyes: PERRLA, EOMs, conjunctiva no swelling or erythema, normal fundi and vessels.  Sinuses: No Frontal/maxillary tenderness  ENT/Mouth: Ext aud canals clear, normal light reflex with TMs without erythema, bulging. Good dentition. No erythema, swelling, or exudate on post pharynx. Tonsils not swollen or erythematous. Hearing normal.  Neck: Supple, thyroid normal. No bruits  Respiratory: Respiratory effort normal, BS equal bilaterally without rales, rhonchi, wheezing or stridor.  Cardio: RRR without murmurs, rubs or gallops. Brisk peripheral pulses without edema.  Chest: symmetric, with normal excursions and percussion.  Breasts: Symmetric, without lumps, nipple discharge, retractions.  Abdomen: Soft, nontender, no guarding, rebound, hernias, masses, or organomegaly.  Lymphatics: Non tender without lymphadenopathy.  Genitourinary:  Musculoskeletal: Full ROM all peripheral extremities,5/5 strength, and normal gait.  Skin: Warm, dry without rashes, lesions, ecchymosis. Neuro: Cranial nerves intact, reflexes equal bilaterally. Normal muscle tone, no cerebellar symptoms. Sensation intact.  Psych: Awake and oriented X 3, normal affect, Insight and Judgment appropriate.   EKG: WNL CRBBB, no ST changes. AORTA SCAN: WNL   Vicie Mutters 9:29 AM [facility]

## 2015-05-03 LAB — LIPID PANEL
CHOLESTEROL: 181 mg/dL (ref 125–200)
HDL: 51 mg/dL (ref >=46)
LDL CALC: 95 mg/dL (ref <130)
TRIGLYCERIDES: 174 mg/dL — AB (ref <150)
Total CHOL/HDL Ratio: 3.5 Ratio (ref <=5.0)
VLDL: 35 mg/dL — ABNORMAL HIGH (ref <30)

## 2015-05-03 LAB — HEPATIC FUNCTION PANEL
ALT: 34 U/L — AB (ref 6–29)
AST: 21 U/L (ref 10–35)
Albumin: 4.6 g/dL (ref 3.6–5.1)
Alkaline Phosphatase: 62 U/L (ref 33–130)
BILIRUBIN DIRECT: 0.1 mg/dL (ref <=0.2)
BILIRUBIN INDIRECT: 0.7 mg/dL (ref 0.2–1.2)
Total Bilirubin: 0.8 mg/dL (ref 0.2–1.2)
Total Protein: 6.9 g/dL (ref 6.1–8.1)

## 2015-05-03 LAB — VITAMIN B12: Vitamin B-12: 954 pg/mL — ABNORMAL HIGH (ref 211–911)

## 2015-05-03 LAB — MAGNESIUM: MAGNESIUM: 2 mg/dL (ref 1.5–2.5)

## 2015-05-03 LAB — IRON AND TIBC
%SAT: 36 % (ref 20–55)
Iron: 126 ug/dL (ref 42–145)
TIBC: 354 ug/dL (ref 250–470)
UIBC: 228 ug/dL (ref 125–400)

## 2015-05-03 LAB — URINALYSIS, MICROSCOPIC ONLY
BACTERIA UA: NONE SEEN [HPF]
CASTS: NONE SEEN [LPF]
CRYSTALS: NONE SEEN [HPF]
RBC / HPF: NONE SEEN RBC/HPF (ref <=2)
WBC UA: NONE SEEN WBC/HPF (ref <=5)
Yeast: NONE SEEN [HPF]

## 2015-05-03 LAB — BASIC METABOLIC PANEL WITH GFR
BUN: 14 mg/dL (ref 7–25)
CO2: 25 mEq/L (ref 20–31)
Calcium: 10.3 mg/dL (ref 8.6–10.4)
Chloride: 103 mEq/L (ref 98–110)
Creat: 0.72 mg/dL (ref 0.50–0.99)
GFR, EST NON AFRICAN AMERICAN: 89 mL/min (ref >=60)
GLUCOSE: 109 mg/dL — AB (ref 65–99)
POTASSIUM: 5 meq/L (ref 3.5–5.3)
SODIUM: 137 meq/L (ref 135–146)

## 2015-05-03 LAB — MICROALBUMIN / CREATININE URINE RATIO
Creatinine, Urine: 115 mg/dL
MICROALB UR: 0.9 mg/dL (ref <2.0)
MICROALB/CREAT RATIO: 7.8 mg/g (ref 0.0–30.0)

## 2015-05-03 LAB — URINALYSIS, ROUTINE W REFLEX MICROSCOPIC
BILIRUBIN URINE: NEGATIVE
GLUCOSE, UA: NEGATIVE
HGB URINE DIPSTICK: NEGATIVE
Ketones, ur: NEGATIVE
Nitrite: NEGATIVE
PROTEIN: NEGATIVE
Specific Gravity, Urine: 1.018 (ref 1.001–1.035)
pH: 7.5 (ref 5.0–8.0)

## 2015-05-03 LAB — INSULIN, FASTING: Insulin fasting, serum: 11.1 u[IU]/mL (ref 2.0–19.6)

## 2015-05-03 LAB — VITAMIN D 25 HYDROXY (VIT D DEFICIENCY, FRACTURES): VIT D 25 HYDROXY: 30 ng/mL (ref 30–100)

## 2015-05-03 LAB — TSH: TSH: 1.819 u[IU]/mL (ref 0.350–4.500)

## 2015-05-03 LAB — FERRITIN: Ferritin: 392 ng/mL — ABNORMAL HIGH (ref 10–291)

## 2015-05-14 ENCOUNTER — Other Ambulatory Visit: Payer: Self-pay | Admitting: Internal Medicine

## 2015-05-15 ENCOUNTER — Encounter: Payer: Self-pay | Admitting: Physician Assistant

## 2015-05-25 ENCOUNTER — Other Ambulatory Visit: Payer: Self-pay | Admitting: Internal Medicine

## 2015-05-28 ENCOUNTER — Other Ambulatory Visit: Payer: Self-pay | Admitting: Internal Medicine

## 2015-05-29 ENCOUNTER — Ambulatory Visit
Admission: RE | Admit: 2015-05-29 | Discharge: 2015-05-29 | Disposition: A | Discharge status: Home / Self Care | Payer: BC Managed Care – PPO | Source: Ambulatory Visit | Attending: Physician Assistant | Admitting: Physician Assistant

## 2015-05-29 DIAGNOSIS — M8580 Other specified disorders of bone density and structure, unspecified site: Secondary | ICD-10-CM

## 2015-06-08 ENCOUNTER — Other Ambulatory Visit: Payer: Self-pay | Admitting: Internal Medicine

## 2015-07-11 ENCOUNTER — Encounter: Payer: Self-pay | Admitting: Physician Assistant

## 2015-07-17 ENCOUNTER — Ambulatory Visit (INDEPENDENT_AMBULATORY_CARE_PROVIDER_SITE_OTHER): Payer: BC Managed Care – PPO | Admitting: Emergency Medicine

## 2015-07-17 ENCOUNTER — Ambulatory Visit (INDEPENDENT_AMBULATORY_CARE_PROVIDER_SITE_OTHER): Payer: BC Managed Care – PPO

## 2015-07-17 VITALS — BP 120/70 | HR 90 | Temp 98.7°F | Resp 18 | Ht 63.0 in | Wt 151.4 lb

## 2015-07-17 DIAGNOSIS — J440 Chronic obstructive pulmonary disease with acute lower respiratory infection: Secondary | ICD-10-CM

## 2015-07-17 DIAGNOSIS — J0140 Acute pansinusitis, unspecified: Secondary | ICD-10-CM | POA: Diagnosis not present

## 2015-07-17 DIAGNOSIS — J209 Acute bronchitis, unspecified: Secondary | ICD-10-CM

## 2015-07-17 MED ORDER — PSEUDOEPHEDRINE-GUAIFENESIN ER 60-600 MG PO TB12: 1.0000 | ORAL_TABLET | Freq: Two times a day (BID) | ORAL | Status: DC

## 2015-07-17 MED ORDER — HYDROCOD POLST-CPM POLST ER 10-8 MG/5ML PO SUER: 5.0000 mL | Freq: Two times a day (BID) | ORAL | Status: DC

## 2015-07-17 MED ORDER — AMOXICILLIN-POT CLAVULANATE 875-125 MG PO TABS: 1.0000 | ORAL_TABLET | Freq: Two times a day (BID) | ORAL | Status: DC

## 2015-07-17 NOTE — Patient Instructions (Signed)
Chronic Obstructive Pulmonary Disease Chronic obstructive pulmonary disease (COPD) is a common lung condition in which airflow from the lungs is limited. COPD is a general term that can be used to describe many different lung problems that limit airflow, including both chronic bronchitis and emphysema. If you have COPD, your lung function will probably never return to normal, but there are measures you can take to improve lung function and make yourself feel better. CAUSES   Smoking (common).  Exposure to secondhand smoke.  Genetic problems.  Chronic inflammatory lung diseases or recurrent infections. SYMPTOMS  Shortness of breath, especially with physical activity.  Deep, persistent (chronic) cough with a large amount of thick mucus.  Wheezing.  Rapid breaths (tachypnea).  Gray or bluish discoloration (cyanosis) of the skin, especially in your fingers, toes, or lips.  Fatigue.  Weight loss.  Frequent infections or episodes when breathing symptoms become much worse (exacerbations).  Chest tightness. DIAGNOSIS Your health care provider will take a medical history and perform a physical examination to diagnose COPD. Additional tests for COPD may include:  Lung (pulmonary) function tests.  Chest X-ray.  CT scan.  Blood tests. TREATMENT  Treatment for COPD may include:  Inhaler and nebulizer medicines. These help manage the symptoms of COPD and make your breathing more comfortable.  Supplemental oxygen. Supplemental oxygen is only helpful if you have a low oxygen level in your blood.  Exercise and physical activity. These are beneficial for nearly all people with COPD.  Lung surgery or transplant.  Nutrition therapy to gain weight, if you are underweight.  Pulmonary rehabilitation. This may involve working with a team of health care providers and specialists, such as respiratory, occupational, and physical therapists. HOME CARE INSTRUCTIONS  Take all medicines  (inhaled or pills) as directed by your health care provider.  Avoid over-the-counter medicines or cough syrups that dry up your airway (such as antihistamines) and slow down the elimination of secretions unless instructed otherwise by your health care provider.  If you are a smoker, the most important thing that you can do is stop smoking. Continuing to smoke will cause further lung damage and breathing trouble. Ask your health care provider for help with quitting smoking. He or she can direct you to community resources or hospitals that provide support.  Avoid exposure to irritants such as smoke, chemicals, and fumes that aggravate your breathing.  Use oxygen therapy and pulmonary rehabilitation if directed by your health care provider. If you require home oxygen therapy, ask your health care provider whether you should purchase a pulse oximeter to measure your oxygen level at home.  Avoid contact with individuals who have a contagious illness.  Avoid extreme temperature and humidity changes.  Eat healthy foods. Eating smaller, more frequent meals and resting before meals may help you maintain your strength.  Stay active, but balance activity with periods of rest. Exercise and physical activity will help you maintain your ability to do things you want to do.  Preventing infection and hospitalization is very important when you have COPD. Make sure to receive all the vaccines your health care provider recommends, especially the pneumococcal and influenza vaccines. Ask your health care provider whether you need a pneumonia vaccine.  Learn and use relaxation techniques to manage stress.  Learn and use controlled breathing techniques as directed by your health care provider. Controlled breathing techniques include:  Pursed lip breathing. Start by breathing in (inhaling) through your nose for 1 second. Then, purse your lips as if you were   going to whistle and breathe out (exhale) through the  pursed lips for 2 seconds.  Diaphragmatic breathing. Start by putting one hand on your abdomen just above your waist. Inhale slowly through your nose. The hand on your abdomen should move out. Then purse your lips and exhale slowly. You should be able to feel the hand on your abdomen moving in as you exhale.  Learn and use controlled coughing to clear mucus from your lungs. Controlled coughing is a series of short, progressive coughs. The steps of controlled coughing are: 1. Lean your head slightly forward. 2. Breathe in deeply using diaphragmatic breathing. 3. Try to hold your breath for 3 seconds. 4. Keep your mouth slightly open while coughing twice. 5. Spit any mucus out into a tissue. 6. Rest and repeat the steps once or twice as needed. SEEK MEDICAL CARE IF:  You are coughing up more mucus than usual.  There is a change in the color or thickness of your mucus.  Your breathing is more labored than usual.  Your breathing is faster than usual. SEEK IMMEDIATE MEDICAL CARE IF:  You have shortness of breath while you are resting.  You have shortness of breath that prevents you from:  Being able to talk.  Performing your usual physical activities.  You have chest pain lasting longer than 5 minutes.  Your skin color is more cyanotic than usual.  You measure low oxygen saturations for longer than 5 minutes with a pulse oximeter. MAKE SURE YOU:  Understand these instructions.  Will watch your condition.  Will get help right away if you are not doing well or get worse.   This information is not intended to replace advice given to you by your health care provider. Make sure you discuss any questions you have with your health care provider.   Document Released: 07/02/2005 Document Revised: 10/13/2014 Document Reviewed: 05/19/2013 Elsevier Interactive Patient Education 2016 [employer]vier Inc.  

## 2015-07-17 NOTE — Progress Notes (Signed)
Subjective:  Patient ID: Kathryn Booker, female    DOB: 30-Sep-1951  Age: 64 y.o. MRN: [id number]  CC: Cough   HPI Kathryn Booker presents  patient has history of COPD now has a three-week history of cough with nasal congestion pressure and postnasal drainage. She has a bad taste in her mouth hasn't expectorated any sputum. She has no fever chills some scant wheezing response to her inhaler. She has no shortness of breath with exertion. She has no paroxysmal nocturnal dyspnea or orthopnea is no peripheral edema. She has no improvement with over-the-counter medication.  History Kathryn Booker has a past medical history of Serum calcium elevated; Anxiety; Elevated cholesterol; RBBB (right bundle branch block with left anterior fascicular block); Thyroid disease; Fatigue; Depression; Asthma; COPD (chronic obstructive pulmonary disease) ([place]); and Scoliosis.   She has past surgical history that includes Tonsilectomy, adenoidectomy, bilateral myringotomy and tubes; Ovarian cyst removal; Appendectomy; and LAPROSCOPIC.   Her  family history includes Alcohol abuse in her father; Heart disease in her mother; Heart failure in her mother; Hyperlipidemia in her mother; Hypertension in her mother; Liver disease in her father.  She   reports that she quit smoking about 19 months ago. Her smoking use included Cigarettes. She smoked 1.00 pack per day. She does not have any smokeless tobacco history on file. She reports that she drinks alcohol. She reports that she does not use illicit drugs.  Outpatient Prescriptions Prior to Visit  Medication Sig Dispense Refill  . ADVAIR DISKUS 250-50 MCG/DOSE AEPB INHALE 1 INHALATION BY MOUTH 2 TIMES DAILY, RINSE MOUTH AFTER INHALATION 180 each 0  . buPROPion (WELLBUTRIN XL) 300 MG 24 hr tablet TAKE 1 TABLET BY MOUTH EVERY DAY 90 tablet 2  . Cholecalciferol (VITAMIN D PO) Take 1,000 Units by mouth.    . citalopram (CELEXA) 40 MG tablet TAKE 1 TABLET (40 MG TOTAL) BY MOUTH  DAILY. 90 tablet 1  . GuaiFENesin (MUCINEX PO) Take by mouth as needed.    [name] levocetirizine (XYZAL) 5 MG tablet Take 1 tablet (5 mg total) by mouth every evening. 30 tablet 9  . LIVALO 4 MG TABS TAKE 1 TABLET BY MOUTH AT BEDTIME 30 tablet 3  . Magnesium 250 MG TABS Take by mouth daily.    . montelukast (SINGULAIR) 10 MG tablet TAKE 1 TABLET (10 MG TOTAL) BY MOUTH AT BEDTIME. 30 tablet 2  . Omega-3 Fatty Acids (FISH OIL) 1000 MG CAPS Take by mouth daily.    [name] OVER THE COUNTER MEDICATION Eye drop 1 drop each eye daily.    . valACYclovir (VALTREX) 500 MG tablet Take 500 mg by mouth daily.      . VENTOLIN HFA 108 (90 BASE) MCG/ACT inhaler INHALE 2 PUFFS INTO THE LUNGS EVERY 4 HOURS AS NEEDED 18 Inhaler 0   Facility-Administered Medications Prior to Visit  Medication Dose Route Frequency Provider Last Rate Last Dose  . ipratropium (ATROVENT) nebulizer solution 0.5 mg  0.5 mg Nebulization Once Elby Beck, FNP        Social History   Social History  . Marital Status: Divorced    Spouse Name: N/A  . Number of Children: N/A  . Years of Education: N/A   Social History Main Topics  . Smoking status: Former Smoker -- 1.00 packs/day    Types: Cigarettes    Quit date: 11/19/2013  . Smokeless tobacco: None  . Alcohol Use: 0.0 oz/week    0 Standard drinks or equivalent per week  Comment: social  . Drug Use: No  . Sexual Activity: Not Asked   Other Topics Concern  . None   Social History Narrative   Divorced   No family   Works at [employer] more   Smokes 1.5 ppd   Sedentary   Stress in life     Review of Systems  Constitutional: Positive for fatigue. Negative for fever, chills and appetite change.  HENT: Positive for postnasal drip and sinus pressure. Negative for congestion, ear pain and sore throat.   Eyes: Negative for pain and redness.  Respiratory: Positive for cough. Negative for shortness of breath and wheezing.   Cardiovascular: Negative for  leg swelling.  Gastrointestinal: Negative for nausea, vomiting, abdominal pain, diarrhea, constipation and blood in stool.  Endocrine: Negative for polyuria.  Genitourinary: Negative for dysuria, urgency, frequency and flank pain.  Musculoskeletal: Negative for gait problem.  Skin: Negative for rash.  Neurological: Negative for weakness and headaches.  Psychiatric/Behavioral: Negative for confusion and decreased concentration. The patient is not nervous/anxious.     Objective:  BP 120/70 mmHg  Pulse 90  Temp(Src) 98.7 F (37.1 C) (Oral)  Resp 18  Ht 5\' 3"  (1.6 m)  Wt 151 lb 6.4 oz (68.675 kg)  BMI 26.83 kg/m2  SpO2 97%  Physical Exam  Constitutional: She is oriented to person, place, and time. She appears well-developed and well-nourished. No distress.  HENT:  Head: Normocephalic and atraumatic.  Right Ear: External ear normal.  Left Ear: External ear normal.  Nose: Nose normal.  Eyes: Conjunctivae and EOM are normal. Pupils are equal, round, and reactive to light. No scleral icterus.  Neck: Normal range of motion. Neck supple. No tracheal deviation present.  Cardiovascular: Normal rate, regular rhythm and normal heart sounds.   Pulmonary/Chest: Effort normal. No respiratory distress. She has no wheezes. She has no rales.  Abdominal: She exhibits no mass. There is no tenderness. There is no rebound and no guarding.  Musculoskeletal: She exhibits no edema.  Lymphadenopathy:    She has no cervical adenopathy.  Neurological: She is alert and oriented to person, place, and time. Coordination normal.  Skin: Skin is warm and dry. No rash noted.  Psychiatric: She has a normal mood and affect. Her behavior is normal.      Assessment & Plan:   Kathryn Booker was seen today for cough.  Diagnoses and all orders for this visit:  COPD (chronic obstructive pulmonary disease) with acute bronchitis ([place]) -     DG Chest 2 View; Future  Acute pansinusitis, recurrence not specified  Other  orders -     amoxicillin-clavulanate (AUGMENTIN) 875-125 MG tablet; Take 1 tablet by mouth 2 (two) times daily. -     pseudoephedrine-guaifenesin (MUCINEX D) 60-600 MG 12 hr tablet; Take 1 tablet by mouth every 12 (twelve) hours. -     chlorpheniramine-HYDROcodone (TUSSIONEX PENNKINETIC ER) 10-8 MG/5ML SUER; Take 5 mLs by mouth 2 (two) times daily.   I am having Kathryn Booker start on amoxicillin-clavulanate, pseudoephedrine-guaifenesin, and chlorpheniramine-HYDROcodone. I am also having her maintain her valACYclovir, GuaiFENesin (MUCINEX PO), Magnesium, OVER THE COUNTER MEDICATION, ADVAIR DISKUS, levocetirizine, citalopram, montelukast, Cholecalciferol (VITAMIN D PO), Fish Oil, LIVALO, VENTOLIN HFA, and buPROPion. We will continue to administer ipratropium.  Meds ordered this encounter  Medications  . amoxicillin-clavulanate (AUGMENTIN) 875-125 MG tablet    Sig: Take 1 tablet by mouth 2 (two) times daily.    Dispense:  20 tablet    Refill:  0  .  pseudoephedrine-guaifenesin (MUCINEX D) 60-600 MG 12 hr tablet    Sig: Take 1 tablet by mouth every 12 (twelve) hours.    Dispense:  18 tablet    Refill:  0  . chlorpheniramine-HYDROcodone (TUSSIONEX PENNKINETIC ER) 10-8 MG/5ML SUER    Sig: Take 5 mLs by mouth 2 (two) times daily.    Dispense:  60 mL    Refill:  0    Appropriate red flag conditions were discussed with the patient as well as actions that should be taken.  Patient expressed his understanding.  Follow-up: Return if symptoms worsen or fail to improve.  Roselee Culver, MD   [facility] reading (PRIMARY) by  Dr. Ouida Sills.  Negative .

## 2015-07-30 ENCOUNTER — Encounter: Payer: Self-pay | Admitting: Physician Assistant

## 2015-08-06 ENCOUNTER — Ambulatory Visit: Payer: Self-pay | Admitting: Physician Assistant

## 2015-08-17 ENCOUNTER — Encounter: Payer: Self-pay | Admitting: Physician Assistant

## 2015-08-17 ENCOUNTER — Ambulatory Visit (INDEPENDENT_AMBULATORY_CARE_PROVIDER_SITE_OTHER): Payer: BC Managed Care – PPO | Admitting: Physician Assistant

## 2015-08-17 VITALS — BP 120/76 | HR 76 | Temp 97.9°F | Resp 14 | Ht 63.0 in | Wt 151.0 lb

## 2015-08-17 DIAGNOSIS — G4733 Obstructive sleep apnea (adult) (pediatric): Secondary | ICD-10-CM

## 2015-08-17 DIAGNOSIS — Z79899 Other long term (current) drug therapy: Secondary | ICD-10-CM

## 2015-08-17 DIAGNOSIS — R7303 Prediabetes: Secondary | ICD-10-CM | POA: Diagnosis not present

## 2015-08-17 DIAGNOSIS — J320 Chronic maxillary sinusitis: Secondary | ICD-10-CM

## 2015-08-17 DIAGNOSIS — E782 Mixed hyperlipidemia: Secondary | ICD-10-CM | POA: Diagnosis not present

## 2015-08-17 DIAGNOSIS — E559 Vitamin D deficiency, unspecified: Secondary | ICD-10-CM

## 2015-08-17 DIAGNOSIS — J449 Chronic obstructive pulmonary disease, unspecified: Secondary | ICD-10-CM

## 2015-08-17 DIAGNOSIS — E039 Hypothyroidism, unspecified: Secondary | ICD-10-CM | POA: Diagnosis not present

## 2015-08-17 LAB — CBC WITH DIFFERENTIAL/PLATELET
Basophils Absolute: 0 10*3/uL (ref 0.0–0.1)
Basophils Relative: 0 % (ref 0–1)
EOS ABS: 0.1 10*3/uL (ref 0.0–0.7)
EOS PCT: 1 % (ref 0–5)
HCT: 42.6 % (ref 36.0–46.0)
Hemoglobin: 14.5 g/dL (ref 12.0–15.0)
LYMPHS ABS: 1.8 10*3/uL (ref 0.7–4.0)
Lymphocytes Relative: 30 % (ref 12–46)
MCH: 32.4 pg (ref 26.0–34.0)
MCHC: 34 g/dL (ref 30.0–36.0)
MCV: 95.1 fL (ref 78.0–100.0)
MPV: 9.7 fL (ref 8.6–12.4)
Monocytes Absolute: 0.4 10*3/uL (ref 0.1–1.0)
Monocytes Relative: 7 % (ref 3–12)
NEUTROS PCT: 62 % (ref 43–77)
Neutro Abs: 3.7 10*3/uL (ref 1.7–7.7)
PLATELETS: 248 10*3/uL (ref 150–400)
RBC: 4.48 MIL/uL (ref 3.87–5.11)
RDW: 13.3 % (ref 11.5–15.5)
WBC: 6 10*3/uL (ref 4.0–10.5)

## 2015-08-17 LAB — HEMOGLOBIN A1C
Hgb A1c MFr Bld: 6 % — ABNORMAL HIGH (ref <5.7)
Mean Plasma Glucose: 126 mg/dL — ABNORMAL HIGH (ref <117)

## 2015-08-17 LAB — HEPATIC FUNCTION PANEL
ALBUMIN: 4.5 g/dL (ref 3.6–5.1)
ALT: 36 U/L — AB (ref 6–29)
AST: 22 U/L (ref 10–35)
Alkaline Phosphatase: 61 U/L (ref 33–130)
BILIRUBIN TOTAL: 0.7 mg/dL (ref 0.2–1.2)
Bilirubin, Direct: 0.1 mg/dL (ref <=0.2)
Indirect Bilirubin: 0.6 mg/dL (ref 0.2–1.2)
TOTAL PROTEIN: 7 g/dL (ref 6.1–8.1)

## 2015-08-17 LAB — LIPID PANEL
Cholesterol: 244 mg/dL — ABNORMAL HIGH (ref 125–200)
HDL: 48 mg/dL (ref >=46)
LDL Cholesterol: 149 mg/dL — ABNORMAL HIGH (ref <130)
Total CHOL/HDL Ratio: 5.1 Ratio — ABNORMAL HIGH (ref <=5.0)
Triglycerides: 237 mg/dL — ABNORMAL HIGH (ref <150)
VLDL: 47 mg/dL — ABNORMAL HIGH (ref <30)

## 2015-08-17 LAB — BASIC METABOLIC PANEL WITH GFR
BUN: 13 mg/dL (ref 7–25)
CHLORIDE: 101 mmol/L (ref 98–110)
CO2: 24 mmol/L (ref 20–31)
CREATININE: 0.79 mg/dL (ref 0.50–0.99)
Calcium: 11.1 mg/dL — ABNORMAL HIGH (ref 8.6–10.4)
GFR, Est African American: >89 mL/min (ref >=60)
GFR, Est Non African American: 80 mL/min (ref >=60)
Glucose, Bld: 118 mg/dL — ABNORMAL HIGH (ref 65–99)
Potassium: 4.7 mmol/L (ref 3.5–5.3)
Sodium: 138 mmol/L (ref 135–146)

## 2015-08-17 LAB — MAGNESIUM: Magnesium: 1.8 mg/dL (ref 1.5–2.5)

## 2015-08-17 MED ORDER — ALBUTEROL SULFATE HFA 108 (90 BASE) MCG/ACT IN AERS: 2.0000 | INHALATION_SPRAY | Freq: Four times a day (QID) | RESPIRATORY_TRACT | Status: DC | PRN

## 2015-08-17 NOTE — Patient Instructions (Signed)
I think it is possible that you have sleep apnea. It can cause interrupted sleep, headaches, frequent awakenings, fatigue, dry mouth, fast/slow heart beats, memory issues, anxiety/depression, swelling, numbness tingling hands/feet, weight gain, shortness of breath, and the list goes on. Sleep apnea needs to be ruled out because if it is left untreated it does eventually lead to abnormal heart beats, lung failure or heart failure as well as increasing the risk of heart attack and stroke. There are masks you can wear OR a mouth piece that I can give you information about. Often times though people feel MUCH better after getting treatment.   Sleep Apnea  Sleep apnea is a sleep disorder characterized by abnormal pauses in breathing while you sleep. When your breathing pauses, the level of oxygen in your blood decreases. This causes you to move out of deep sleep and into light sleep. As a result, your quality of sleep is poor, and the system that carries your blood throughout your body (cardiovascular system) experiences stress. If sleep apnea remains untreated, the following conditions can develop:  High blood pressure (hypertension).  Coronary artery disease.  Inability to achieve or maintain an erection (impotence).  Impairment of your thought process (cognitive dysfunction). There are three types of sleep apnea: 1. Obstructive sleep apnea--Pauses in breathing during sleep because of a blocked airway. 2. Central sleep apnea--Pauses in breathing during sleep because the area of the brain that controls your breathing does not send the correct signals to the muscles that control breathing. 3. Mixed sleep apnea--A combination of both obstructive and central sleep apnea.  RISK FACTORS The following risk factors can increase your risk of developing sleep apnea:  Being overweight.  Smoking.  Having narrow passages in your nose and throat.  Being of older age.  Being female.  Alcohol use.   Sedative and tranquilizer use.  Ethnicity. Among individuals younger than 35 years, African Americans are at increased risk of sleep apnea. SYMPTOMS   Difficulty staying asleep.  Daytime sleepiness and fatigue.  Loss of energy.  Irritability.  Loud, heavy snoring.  Morning headaches.  Trouble concentrating.  Forgetfulness.  Decreased interest in sex. DIAGNOSIS  In order to diagnose sleep apnea, your caregiver will perform a physical examination. Your caregiver may suggest that you take a home sleep test. Your caregiver may also recommend that you spend the night in a sleep lab. In the sleep lab, several monitors record information about your heart, lungs, and brain while you sleep. Your leg and arm movements and blood oxygen level are also recorded. TREATMENT The following actions may help to resolve mild sleep apnea:  Sleeping on your side.   Using a decongestant if you have nasal congestion.   Avoiding the use of depressants, including alcohol, sedatives, and narcotics.   Losing weight and modifying your diet if you are overweight. There also are devices and treatments to help open your airway:  Oral appliances. These are custom-made mouthpieces that shift your lower jaw forward and slightly open your bite. This opens your airway.  Devices that create positive airway pressure. This positive pressure "splints" your airway open to help you breathe better during sleep. The following devices create positive airway pressure:  Continuous positive airway pressure (CPAP) device. The CPAP device creates a continuous level of air pressure with an air pump. The air is delivered to your airway through a mask while you sleep. This continuous pressure keeps your airway open.  Nasal expiratory positive airway pressure (EPAP) device. The EPAP device   creates positive air pressure as you exhale. The device consists of single-use valves, which are inserted into each nostril and held in  place by adhesive. The valves create very little resistance when you inhale but create much more resistance when you exhale. That increased resistance creates the positive airway pressure. This positive pressure while you exhale keeps your airway open, making it easier to breath when you inhale again.  Bilevel positive airway pressure (BPAP) device. The BPAP device is used mainly in patients with central sleep apnea. This device is similar to the CPAP device because it also uses an air pump to deliver continuous air pressure through a mask. However, with the BPAP machine, the pressure is set at two different levels. The pressure when you exhale is lower than the pressure when you inhale.  Surgery. Typically, surgery is only done if you cannot comply with less invasive treatments or if the less invasive treatments do not improve your condition. Surgery involves removing excess tissue in your airway to create a wider passage way. Document Released: 09/12/2002 Document Revised: 01/17/2013 Document Reviewed: 01/29/2012 [employer] Patient Information 2015 [employer]. This information is not intended to replace advice given to you by your health care provider. Make sure you discuss any questions you have with your health care provider.     

## 2015-08-17 NOTE — Progress Notes (Signed)
Assessment and Plan:  1. Hypertension -Continue medication, monitor blood pressure at home. Continue DASH diet.  Reminder to go to the ER if any CP, SOB, nausea, dizziness, severe HA, changes vision/speech, left arm numbness and tingling and jaw pain.  2. Cholesterol -Continue diet and exercise. Check cholesterol, not willing to get on livalo, willing to try zetia.   3. Prediabetes  -Continue diet and exercise. Check A1C  4. Vitamin D Def - check level and continue medications.   5. Chronic sinusitis Will refer to Dr. Lucia Gaskins, continue allergy pill, on nasocort.   6. Memory issues ? Sleep apnea- will get sleep study, has snoring, frequency awakenings Suggest stopping alcohol or decreasing.   Continue diet and meds as discussed. Further disposition pending results of labs. Over 30 minutes of exam, counseling, chart review, and critical decision making was performed  HPI 64 y.o. female  presents for 3 month follow up on hypertension, cholesterol, prediabetes, and vitamin D deficiency.   Her blood pressure has been controlled at home, today their BP is BP: 120/76 mmHg  She does not workout. She denies chest pain, shortness of breath, dizziness.  She is not on cholesterol medication, suppose to be on livalo but has stopped due to cost and her memory.  Her cholesterol is at goal. The cholesterol last visit was:   Lab Results  Component Value Date   CHOL 181 05/02/2015   HDL 51 05/02/2015   LDLCALC 95 05/02/2015   TRIG 174* 05/02/2015   CHOLHDL 3.5 05/02/2015    She has been working on diet and exercise for prediabetes, and denies paresthesia of the feet, polydipsia, polyuria and visual disturbances. Last A1C in the office was:  Lab Results  Component Value Date   HGBA1C 6.1* 05/02/2015   She does not sleep well at night, is up several times in the night to use the rest room. Has some morning confusion occ, has some short term issues, she will drink almost nightly.  Lab Results   Component Value Date   ALT 34* 05/02/2015   AST 21 05/02/2015   ALKPHOS 62 05/02/2015   BILITOT 0.8 05/02/2015   She has deviated septum on left side, has had several bouts of sinusitis, has chronic cough, not on ACE. Would like to see ENT.  Patient is on Vitamin D supplement.   Lab Results  Component Value Date   VD25OH 30 05/02/2015      Current Medications:  Current Outpatient Prescriptions on File Prior to Visit  Medication Sig Dispense Refill  . ADVAIR DISKUS 250-50 MCG/DOSE AEPB INHALE 1 INHALATION BY MOUTH 2 TIMES DAILY, RINSE MOUTH AFTER INHALATION 180 each 0  . buPROPion (WELLBUTRIN XL) 300 MG 24 hr tablet TAKE 1 TABLET BY MOUTH EVERY DAY 90 tablet 2  . chlorpheniramine-HYDROcodone (TUSSIONEX PENNKINETIC ER) 10-8 MG/5ML SUER Take 5 mLs by mouth 2 (two) times daily. 60 mL 0  . Cholecalciferol (VITAMIN D PO) Take 1,000 Units by mouth.    . citalopram (CELEXA) 40 MG tablet TAKE 1 TABLET (40 MG TOTAL) BY MOUTH DAILY. 90 tablet 1  . GuaiFENesin (MUCINEX PO) Take by mouth as needed.    [name] levocetirizine (XYZAL) 5 MG tablet Take 1 tablet (5 mg total) by mouth every evening. 30 tablet 9  . Magnesium 250 MG TABS Take by mouth daily.    . montelukast (SINGULAIR) 10 MG tablet TAKE 1 TABLET (10 MG TOTAL) BY MOUTH AT BEDTIME. 30 tablet 2  . Omega-3 Fatty Acids (FISH OIL)  1000 MG CAPS Take by mouth daily.    [name] OVER THE COUNTER MEDICATION Eye drop 1 drop each eye daily.    . pseudoephedrine-guaifenesin (MUCINEX D) 60-600 MG 12 hr tablet Take 1 tablet by mouth every 12 (twelve) hours. 18 tablet 0  . valACYclovir (VALTREX) 500 MG tablet Take 500 mg by mouth daily.      . VENTOLIN HFA 108 (90 BASE) MCG/ACT inhaler INHALE 2 PUFFS INTO THE LUNGS EVERY 4 HOURS AS NEEDED 18 Inhaler 0   Current Facility-Administered Medications on File Prior to Visit  Medication Dose Route Frequency Provider Last Rate Last Dose  . ipratropium (ATROVENT) nebulizer solution 0.5 mg  0.5 mg Nebulization Once Elby Beck, FNP       Medical History:  Past Medical History  Diagnosis Date  . Serum calcium elevated   . Anxiety   . Elevated cholesterol   . RBBB (right bundle branch block with left anterior fascicular block)   . Thyroid disease   . Fatigue   . Depression   . Asthma   . COPD (chronic obstructive pulmonary disease) ([place])   . Scoliosis    Allergies:  Allergies  Allergen Reactions  . Lipitor [Atorvastatin]     Myalgias      Review of Systems:  Review of Systems  Constitutional: Positive for malaise/fatigue. Negative for fever, chills, weight loss and diaphoresis.  HENT: Positive for congestion. Negative for ear discharge, ear pain, hearing loss, nosebleeds, sore throat and tinnitus.        + snoring  Eyes: Negative.   Respiratory: Negative.  Negative for stridor.   Cardiovascular: Negative.   Gastrointestinal: Negative.   Genitourinary: Negative.   Musculoskeletal: Negative.   Skin: Negative.   Neurological: Negative.  Negative for weakness and headaches.  Psychiatric/Behavioral: Negative.     Family history- Review and unchanged Social history- Review and unchanged Physical Exam: BP 120/76 mmHg  Pulse 76  Temp(Src) 97.9 F (36.6 C) (Temporal)  Resp 14  Ht 5\' 3"  (1.6 m)  Wt 151 lb (68.493 kg)  BMI 26.76 kg/m2  SpO2 99% Wt Readings from Last 3 Encounters:  08/17/15 151 lb (68.493 kg)  07/17/15 151 lb 6.4 oz (68.675 kg)  05/02/15 151 lb (68.493 kg)   General Appearance: Well nourished, in no apparent distress. Eyes: PERRLA, EOMs, conjunctiva no swelling or erythema Sinuses: No Frontal/maxillary tenderness ENT/Mouth: Ext aud canals clear, TMs without erythema, bulging. No erythema, swelling, or exudate on post pharynx.  Tonsils not swollen or erythematous. Hearing normal.  Neck: Supple, thyroid normal.  Respiratory: Respiratory effort normal, BS equal bilaterally without rales, rhonchi, wheezing or stridor.  Cardio: RRR with no MRGs. Brisk peripheral pulses  without edema.  Abdomen: Soft, + BS,  Non tender, no guarding, rebound, hernias, masses. Lymphatics: Non tender without lymphadenopathy.  Musculoskeletal: Full ROM, 5/5 strength, Normal gait Skin: Warm, dry without rashes, lesions, ecchymosis.  Neuro: Cranial nerves intact. Normal muscle tone, no cerebellar symptoms. Psych: Awake and oriented X 3, normal affect, Insight and Judgment appropriate.    Vicie Mutters, PA-C 9:05 AM [facility]

## 2015-08-18 LAB — TSH: TSH: 2.565 u[IU]/mL (ref 0.350–4.500)

## 2015-08-18 LAB — VITAMIN D 25 HYDROXY (VIT D DEFICIENCY, FRACTURES): VIT D 25 HYDROXY: 37 ng/mL (ref 30–100)

## 2015-08-27 ENCOUNTER — Encounter: Payer: Self-pay | Admitting: Physician Assistant

## 2015-08-28 ENCOUNTER — Encounter: Payer: Self-pay | Admitting: Physician Assistant

## 2015-08-28 ENCOUNTER — Ambulatory Visit (INDEPENDENT_AMBULATORY_CARE_PROVIDER_SITE_OTHER): Payer: BC Managed Care – PPO | Admitting: Physician Assistant

## 2015-08-28 VITALS — BP 118/70 | HR 86 | Temp 98.1°F | Resp 16 | Ht 63.0 in | Wt 148.0 lb

## 2015-08-28 DIAGNOSIS — E782 Mixed hyperlipidemia: Secondary | ICD-10-CM | POA: Diagnosis not present

## 2015-08-28 DIAGNOSIS — F329 Major depressive disorder, single episode, unspecified: Secondary | ICD-10-CM

## 2015-08-28 DIAGNOSIS — R1013 Epigastric pain: Secondary | ICD-10-CM

## 2015-08-28 DIAGNOSIS — F32A Depression, unspecified: Secondary | ICD-10-CM

## 2015-08-28 LAB — HEPATIC FUNCTION PANEL
ALT: 29 U/L (ref 6–29)
AST: 20 U/L (ref 10–35)
Albumin: 4.6 g/dL (ref 3.6–5.1)
Alkaline Phosphatase: 65 U/L (ref 33–130)
BILIRUBIN INDIRECT: 0.6 mg/dL (ref 0.2–1.2)
Bilirubin, Direct: 0.1 mg/dL (ref <=0.2)
TOTAL PROTEIN: 7.2 g/dL (ref 6.1–8.1)
Total Bilirubin: 0.7 mg/dL (ref 0.2–1.2)

## 2015-08-28 LAB — LIPID PANEL
CHOL/HDL RATIO: 5.5 ratio — AB (ref <=5.0)
CHOLESTEROL: 264 mg/dL — AB (ref 125–200)
HDL: 48 mg/dL (ref >=46)
LDL CALC: 180 mg/dL — AB (ref <130)
TRIGLYCERIDES: 179 mg/dL — AB (ref <150)
VLDL: 36 mg/dL — AB (ref <30)

## 2015-08-28 LAB — AMYLASE: Amylase: 23 U/L (ref 0–105)

## 2015-08-28 MED ORDER — ALPRAZOLAM 0.5 MG PO TABS: 0.5000 mg | ORAL_TABLET | Freq: Two times a day (BID) | ORAL | Status: DC | PRN

## 2015-08-28 MED ORDER — EZETIMIBE 10 MG PO TABS: 10.0000 mg | ORAL_TABLET | Freq: Every day | ORAL | Status: DC

## 2015-08-28 NOTE — Progress Notes (Signed)
Subjective:    Patient ID: Kathryn Booker, female    DOB: 1950/12/03, 64 y.o.   MRN: [id number]  HPI 64 y.o. WF with history of COPD, chol, preDM, hypothyroid presents with light color stools and AB pain. She had very light clay colored stool on Monday/Tuesday, she did have heartburn Sunday and had significant amount of tums on Sunday. Then she started to have some left quadrant tenderness on Saturday.  She is crying currently due to anxiety and the holidays, she is on celexa and wellbutrin. Weight is unchanged, she denies night sweats, she has fatigue recently in the evenings.   Had normal colonoscopy in 2008, Dr. Deatra Ina. She had CT AB 2011 normal.  Lab Results  Component Value Date   ALT 36* 08/17/2015   AST 22 08/17/2015   ALKPHOS 61 08/17/2015   BILITOT 0.7 08/17/2015   Wt Readings from Last 10 Encounters:  08/28/15 148 lb (67.132 kg)  08/17/15 151 lb (68.493 kg)  07/17/15 151 lb 6.4 oz (68.675 kg)  05/02/15 151 lb (68.493 kg)  10/24/14 154 lb (69.854 kg)  09/16/14 153 lb (69.4 kg)  05/18/14 154 lb (69.854 kg)  03/21/14 151 lb 3.2 oz (68.584 kg)  02/14/14 148 lb (67.132 kg)  01/26/14 145 lb (65.772 kg)    There were no vitals taken for this visit. Current Outpatient Prescriptions on File Prior to Visit  Medication Sig Dispense Refill  . ADVAIR DISKUS 250-50 MCG/DOSE AEPB INHALE 1 INHALATION BY MOUTH 2 TIMES DAILY, RINSE MOUTH AFTER INHALATION 180 each 0  . albuterol (PROAIR HFA) 108 (90 BASE) MCG/ACT inhaler Inhale 2 puffs into the lungs every 6 (six) hours as needed for wheezing or shortness of breath. 18 g 3  . buPROPion (WELLBUTRIN XL) 300 MG 24 hr tablet TAKE 1 TABLET BY MOUTH EVERY DAY 90 tablet 2  . chlorpheniramine-HYDROcodone (TUSSIONEX PENNKINETIC ER) 10-8 MG/5ML SUER Take 5 mLs by mouth 2 (two) times daily. 60 mL 0  . Cholecalciferol (VITAMIN D PO) Take 1,000 Units by mouth.    . citalopram (CELEXA) 40 MG tablet TAKE 1 TABLET (40 MG TOTAL) BY MOUTH DAILY. 90  tablet 1  . GuaiFENesin (MUCINEX PO) Take by mouth as needed.    [name] levocetirizine (XYZAL) 5 MG tablet Take 1 tablet (5 mg total) by mouth every evening. 30 tablet 9  . Magnesium 250 MG TABS Take by mouth daily.    . montelukast (SINGULAIR) 10 MG tablet TAKE 1 TABLET (10 MG TOTAL) BY MOUTH AT BEDTIME. 30 tablet 2  . Omega-3 Fatty Acids (FISH OIL) 1000 MG CAPS Take by mouth daily.    [name] OVER THE COUNTER MEDICATION Eye drop 1 drop each eye daily.    . pseudoephedrine-guaifenesin (MUCINEX D) 60-600 MG 12 hr tablet Take 1 tablet by mouth every 12 (twelve) hours. 18 tablet 0  . valACYclovir (VALTREX) 500 MG tablet Take 500 mg by mouth daily.       Current Facility-Administered Medications on File Prior to Visit  Medication Dose Route Frequency Provider Last Rate Last Dose  . ipratropium (ATROVENT) nebulizer solution 0.5 mg  0.5 mg Nebulization Once Elby Beck, FNP       Past Medical History  Diagnosis Date  . Serum calcium elevated   . Anxiety   . Elevated cholesterol   . RBBB (right bundle branch block with left anterior fascicular block)   . Thyroid disease   . Fatigue   . Depression   . Asthma   .  COPD (chronic obstructive pulmonary disease) ([place])   . Scoliosis    Review of Systems  Constitutional: Positive for fatigue. Negative for fever, chills, diaphoresis, activity change, appetite change and unexpected weight change.  HENT: Negative.   Respiratory: Negative.   Cardiovascular: Negative.   Gastrointestinal: Positive for abdominal pain (with pale stools). Negative for nausea, vomiting, diarrhea, constipation, blood in stool, abdominal distention, anal bleeding and rectal pain.  Genitourinary: Negative.   Musculoskeletal: Negative.   Neurological: Negative.  Negative for dizziness.  Psychiatric/Behavioral: Positive for dysphoric mood. Negative for suicidal ideas, hallucinations, behavioral problems, confusion, sleep disturbance, self-injury, decreased concentration and  agitation. The patient is nervous/anxious. The patient is not hyperactive.        Objective:   Physical Exam  Constitutional: She is oriented to person, place, and time. She appears well-developed and well-nourished.  HENT:  Head: Normocephalic and atraumatic.  Right Ear: External ear normal.  Left Ear: External ear normal.  Mouth/Throat: Oropharynx is clear and moist.  Eyes: Conjunctivae and EOM are normal. Pupils are equal, round, and reactive to light.  Neck: Normal range of motion. Neck supple. No thyromegaly present.  Cardiovascular: Normal rate, regular rhythm and normal heart sounds.  Exam reveals no gallop and no friction rub.   No murmur heard. Pulmonary/Chest: Effort normal and breath sounds normal. No respiratory distress. She has no wheezes.  Abdominal: Soft. Bowel sounds are normal. She exhibits no distension and no mass. There is tenderness (left upper quadrant). There is no rebound and no guarding.  Musculoskeletal: Normal range of motion.  Lymphadenopathy:    She has no cervical adenopathy.  Neurological: She is alert and oriented to person, place, and time. She displays normal reflexes. No cranial nerve deficit. Coordination normal.  Skin: Skin is warm and dry.  Psychiatric: She has a normal mood and affect.  Crying due to anxiety       Assessment & Plan:  Pale stools, AB pain Get labs, will get imaging.   Anxiety Xanax 0.5mg  #60, NR

## 2015-08-29 LAB — PTH, INTACT AND CALCIUM
CALCIUM: 10.7 mg/dL — AB (ref 8.4–10.5)
PTH: 59 pg/mL (ref 14–64)

## 2015-09-13 ENCOUNTER — Other Ambulatory Visit: Payer: Self-pay | Admitting: Internal Medicine

## 2015-09-14 ENCOUNTER — Other Ambulatory Visit: Payer: Self-pay

## 2015-09-14 MED ORDER — VALACYCLOVIR HCL 500 MG PO TABS: 500.0000 mg | ORAL_TABLET | Freq: Every day | ORAL | Status: DC

## 2015-09-15 ENCOUNTER — Other Ambulatory Visit: Payer: Self-pay | Admitting: Internal Medicine

## 2015-10-03 ENCOUNTER — Ambulatory Visit: Payer: Self-pay | Admitting: Physician Assistant

## 2015-10-07 HISTORY — PX: BREAST EXCISIONAL BIOPSY: SUR124

## 2015-10-11 ENCOUNTER — Other Ambulatory Visit: Payer: Self-pay

## 2015-10-11 MED ORDER — VALACYCLOVIR HCL 500 MG PO TABS: 500.0000 mg | ORAL_TABLET | Freq: Every day | ORAL | Status: DC

## 2015-10-11 MED ORDER — EZETIMIBE 10 MG PO TABS: 10.0000 mg | ORAL_TABLET | Freq: Every day | ORAL | Status: DC

## 2015-11-24 ENCOUNTER — Encounter: Payer: Self-pay | Admitting: *Deleted

## 2015-11-30 ENCOUNTER — Ambulatory Visit (INDEPENDENT_AMBULATORY_CARE_PROVIDER_SITE_OTHER): Payer: BC Managed Care – PPO | Admitting: Physician Assistant

## 2015-11-30 ENCOUNTER — Encounter: Payer: Self-pay | Admitting: Physician Assistant

## 2015-11-30 VITALS — BP 126/68 | HR 68 | Temp 97.7°F | Resp 16 | Ht 63.0 in | Wt 147.2 lb

## 2015-11-30 DIAGNOSIS — Z79899 Other long term (current) drug therapy: Secondary | ICD-10-CM

## 2015-11-30 DIAGNOSIS — J449 Chronic obstructive pulmonary disease, unspecified: Secondary | ICD-10-CM | POA: Diagnosis not present

## 2015-11-30 DIAGNOSIS — E039 Hypothyroidism, unspecified: Secondary | ICD-10-CM | POA: Diagnosis not present

## 2015-11-30 DIAGNOSIS — F32A Depression, unspecified: Secondary | ICD-10-CM

## 2015-11-30 DIAGNOSIS — R7303 Prediabetes: Secondary | ICD-10-CM

## 2015-11-30 DIAGNOSIS — I10 Essential (primary) hypertension: Secondary | ICD-10-CM | POA: Diagnosis not present

## 2015-11-30 DIAGNOSIS — R21 Rash and other nonspecific skin eruption: Secondary | ICD-10-CM | POA: Diagnosis not present

## 2015-11-30 DIAGNOSIS — E782 Mixed hyperlipidemia: Secondary | ICD-10-CM | POA: Diagnosis not present

## 2015-11-30 DIAGNOSIS — F329 Major depressive disorder, single episode, unspecified: Secondary | ICD-10-CM | POA: Diagnosis not present

## 2015-11-30 DIAGNOSIS — E559 Vitamin D deficiency, unspecified: Secondary | ICD-10-CM | POA: Diagnosis not present

## 2015-11-30 LAB — CBC WITH DIFFERENTIAL/PLATELET
Basophils Absolute: 0.1 10*3/uL (ref 0.0–0.1)
Basophils Relative: 1 % (ref 0–1)
Eosinophils Absolute: 0.3 10*3/uL (ref 0.0–0.7)
Eosinophils Relative: 4 % (ref 0–5)
HEMATOCRIT: 46.4 % — AB (ref 36.0–46.0)
HEMOGLOBIN: 16.1 g/dL — AB (ref 12.0–15.0)
LYMPHS PCT: 26 % (ref 12–46)
Lymphs Abs: 1.7 10*3/uL (ref 0.7–4.0)
MCH: 33.5 pg (ref 26.0–34.0)
MCHC: 34.7 g/dL (ref 30.0–36.0)
MCV: 96.7 fL (ref 78.0–100.0)
MONO ABS: 0.4 10*3/uL (ref 0.1–1.0)
MONOS PCT: 7 % (ref 3–12)
MPV: 9.5 fL (ref 8.6–12.4)
NEUTROS ABS: 4 10*3/uL (ref 1.7–7.7)
Neutrophils Relative %: 62 % (ref 43–77)
Platelets: 274 10*3/uL (ref 150–400)
RBC: 4.8 MIL/uL (ref 3.87–5.11)
RDW: 13.1 % (ref 11.5–15.5)
WBC: 6.4 10*3/uL (ref 4.0–10.5)

## 2015-11-30 LAB — LIPID PANEL
Cholesterol: 225 mg/dL — ABNORMAL HIGH (ref 125–200)
HDL: 49 mg/dL (ref >=46)
LDL CALC: 143 mg/dL — AB (ref <130)
TRIGLYCERIDES: 165 mg/dL — AB (ref <150)
Total CHOL/HDL Ratio: 4.6 Ratio (ref <=5.0)
VLDL: 33 mg/dL — AB (ref <30)

## 2015-11-30 LAB — BASIC METABOLIC PANEL WITH GFR
BUN: 15 mg/dL (ref 7–25)
CO2: 26 mmol/L (ref 20–31)
Calcium: 10.7 mg/dL — ABNORMAL HIGH (ref 8.6–10.4)
Chloride: 104 mmol/L (ref 98–110)
Creat: 0.77 mg/dL (ref 0.50–0.99)
GFR, EST NON AFRICAN AMERICAN: 82 mL/min (ref >=60)
GFR, Est African American: >89 mL/min (ref >=60)
GLUCOSE: 110 mg/dL — AB (ref 65–99)
POTASSIUM: 4.5 mmol/L (ref 3.5–5.3)
Sodium: 140 mmol/L (ref 135–146)

## 2015-11-30 LAB — HEMOGLOBIN A1C
HEMOGLOBIN A1C: 5.8 % — AB (ref <5.7)
MEAN PLASMA GLUCOSE: 120 mg/dL — AB (ref <117)

## 2015-11-30 LAB — HEPATIC FUNCTION PANEL
ALK PHOS: 60 U/L (ref 33–130)
ALT: 28 U/L (ref 6–29)
AST: 19 U/L (ref 10–35)
Albumin: 4.4 g/dL (ref 3.6–5.1)
BILIRUBIN INDIRECT: 0.7 mg/dL (ref 0.2–1.2)
Bilirubin, Direct: 0.1 mg/dL (ref <=0.2)
TOTAL PROTEIN: 7 g/dL (ref 6.1–8.1)
Total Bilirubin: 0.8 mg/dL (ref 0.2–1.2)

## 2015-11-30 LAB — MAGNESIUM: Magnesium: 1.8 mg/dL (ref 1.5–2.5)

## 2015-11-30 MED ORDER — NYSTATIN 100000 UNIT/GM EX CREA: 1.0000 "application " | TOPICAL_CREAM | Freq: Two times a day (BID) | CUTANEOUS | Status: DC

## 2015-11-30 MED ORDER — CLOTRIMAZOLE-BETAMETHASONE 1-0.05 % EX CREA: 1.0000 "application " | TOPICAL_CREAM | Freq: Two times a day (BID) | CUTANEOUS | Status: DC

## 2015-11-30 NOTE — Patient Instructions (Addendum)
Serotonin Syndrome Serotonin is a brain chemical that regulates the nervous system, which includes the brain, spinal cord, and nerves. Serotonin appears to play a role in all types of behavior, including appetite, emotions, movement, thinking, and response to stress. Excessively high levels of serotonin in the body can cause serotonin syndrome, which is a very dangerous condition. CAUSES This condition can be caused by taking medicines or drugs that increase the level of serotonin in your body. These include:  Antidepressant medicines.  Migraine medicines.  Certain pain medicines.  Certain recreational drugs, including ecstasy, LSD, cocaine, and amphetamines.  Over-the-counter cough or cold medicines that contain dextromethorphan.  Certain herbal supplements, including St. John's wort, ginseng, and nutmeg. This condition usually occurs when you take these medicines or drugs in combination, but it can also happen with a high dose of a single medicine or drug. RISK FACTORS This condition is more likely to develop in:  People who have recently increased the dosage of medicine that increases the serotonin level.  People who just started taking medicine that increases the serotonin level. SYMPTOMS Symptoms of this condition usually happens within several hours of a medicine change. Symptoms include:  Headache.  Muscle twitching or stiffness.  Diarrhea.  Confusion.  Restlessness or agitation.  Shivering or goose bumps.  Loss of muscle coordination.  Rapid heart rate.  Sweating. Severe cases of serotonin syndromecan cause:  Irregular heartbeat.  Seizures.  Loss of consciousness.  High fever. DIAGNOSIS This condition is diagnosed with a medical history and physical exam. You will be asked aboutyour symptoms and your use of medicines and recreational drugs. Your health care provider may also order lab work or additional tests to rule out other causes of your  symptoms. TREATMENT The treatment for this condition depends on the severity of your symptoms. For mild cases, stopping the medicine that caused your condition is usually all that is needed. For moderate to severe cases, hospitalization is required to monitor you and to prevent further muscle damage. HOME CARE INSTRUCTIONS  Take over-the-counter and prescription medicines only as told by your health care provider. This is important.  Check with your health care provider before you start taking any new prescriptions, over-the-counter medicines, herbs, or supplements.  Avoid combining any medicines that can cause this condition to occur.  Keep all follow-up visits as told by your health care provider.This is important.  Maintain a healthy lifestyle.  Eat healthy foods.  Get plenty of sleep.  Exercise regularly.  Do not drink alcohol.  Do not use recreational drugs. SEEK MEDICAL CARE IF:  Medicines do not seem to be helping.  Your symptoms do not improve or they get worse.  You have trouble taking care of yourself. SEEK IMMEDIATE MEDICAL CARE IF:  You have worsening confusion, severe headache, chest pain, high fever, seizures, or loss of consciousness.  You have serious thoughts about hurting yourself or others.  You experience serious side effects of medicine, such as swelling of your face, lips, tongue, or throat.   This information is not intended to replace advice given to you by your health care provider. Make sure you discuss any questions you have with your health care provider.   Document Released: 10/30/2004 Document Revised: 02/06/2015 Document Reviewed: 10/05/2014 Elsevier Interactive Patient Education [employer].  I think it is possible that you have sleep apnea. It can cause interrupted sleep, headaches, frequent awakenings, fatigue, dry mouth, fast/slow heart beats, memory issues, anxiety/depression, swelling, numbness tingling hands/feet, weight gain,  shortness  of breath, and the list goes on. Sleep apnea needs to be ruled out because if it is left untreated it does eventually lead to abnormal heart beats, lung failure or heart failure as well as increasing the risk of heart attack and stroke. There are masks you can wear OR a mouth piece that I can give you information about. Often times though people feel MUCH better after getting treatment.   Sleep Apnea  Sleep apnea is a sleep disorder characterized by abnormal pauses in breathing while you sleep. When your breathing pauses, the level of oxygen in your blood decreases. This causes you to move out of deep sleep and into light sleep. As a result, your quality of sleep is poor, and the system that carries your blood throughout your body (cardiovascular system) experiences stress. If sleep apnea remains untreated, the following conditions can develop:  High blood pressure (hypertension).  Coronary artery disease.  Inability to achieve or maintain an erection (impotence).  Impairment of your thought process (cognitive dysfunction). There are three types of sleep apnea: 1. Obstructive sleep apnea--Pauses in breathing during sleep because of a blocked airway. 2. Central sleep apnea--Pauses in breathing during sleep because the area of the brain that controls your breathing does not send the correct signals to the muscles that control breathing. 3. Mixed sleep apnea--A combination of both obstructive and central sleep apnea.  RISK FACTORS The following risk factors can increase your risk of developing sleep apnea:  Being overweight.  Smoking.  Having narrow passages in your nose and throat.  Being of older age.  Being female.  Alcohol use.  Sedative and tranquilizer use.  Ethnicity. Among individuals younger than 35 years, African Americans are at increased risk of sleep apnea. SYMPTOMS   Difficulty staying asleep.  Daytime sleepiness and fatigue.  Loss of energy.   Irritability.  Loud, heavy snoring.  Morning headaches.  Trouble concentrating.  Forgetfulness.  Decreased interest in sex. DIAGNOSIS  In order to diagnose sleep apnea, your caregiver will perform a physical examination. Your caregiver may suggest that you take a home sleep test. Your caregiver may also recommend that you spend the night in a sleep lab. In the sleep lab, several monitors record information about your heart, lungs, and brain while you sleep. Your leg and arm movements and blood oxygen level are also recorded. TREATMENT The following actions may help to resolve mild sleep apnea:  Sleeping on your side.   Using a decongestant if you have nasal congestion.   Avoiding the use of depressants, including alcohol, sedatives, and narcotics.   Losing weight and modifying your diet if you are overweight. There also are devices and treatments to help open your airway:  Oral appliances. These are custom-made mouthpieces that shift your lower jaw forward and slightly open your bite. This opens your airway.  Devices that create positive airway pressure. This positive pressure "splints" your airway open to help you breathe better during sleep. The following devices create positive airway pressure:  Continuous positive airway pressure (CPAP) device. The CPAP device creates a continuous level of air pressure with an air pump. The air is delivered to your airway through a mask while you sleep. This continuous pressure keeps your airway open.  Nasal expiratory positive airway pressure (EPAP) device. The EPAP device creates positive air pressure as you exhale. The device consists of single-use valves, which are inserted into each nostril and held in place by adhesive. The valves create very little resistance when you inhale  but create much more resistance when you exhale. That increased resistance creates the positive airway pressure. This positive pressure while you exhale keeps your  airway open, making it easier to breath when you inhale again.  Bilevel positive airway pressure (BPAP) device. The BPAP device is used mainly in patients with central sleep apnea. This device is similar to the CPAP device because it also uses an air pump to deliver continuous air pressure through a mask. However, with the BPAP machine, the pressure is set at two different levels. The pressure when you exhale is lower than the pressure when you inhale.  Surgery. Typically, surgery is only done if you cannot comply with less invasive treatments or if the less invasive treatments do not improve your condition. Surgery involves removing excess tissue in your airway to create a wider passage way. Document Released: 09/12/2002 Document Revised: 01/17/2013 Document Reviewed: 01/29/2012 [facility] Patient Information 2015 [place], [place]. This information is not intended to replace advice given to you by your health care provider. Make sure you discuss any questions you have with your health care provider.     Fatigue Fatigue is feeling tired all of the time, a lack of energy, or a lack of motivation. Occasional or mild fatigue is often a normal response to activity or life in general. However, long-lasting (chronic) or extreme fatigue may indicate an underlying medical condition. HOME CARE INSTRUCTIONS  Watch your fatigue for any changes. The following actions may help to lessen any discomfort you are feeling:  Talk to your health care provider about how much sleep you need each night. Try to get the required amount every night.  Take medicines only as directed by your health care provider.  Eat a healthy and nutritious diet. Ask your health care provider if you need help changing your diet.  Drink enough fluid to keep your urine clear or pale yellow.  Practice ways of relaxing, such as yoga, meditation, massage therapy, or acupuncture.  Exercise regularly.   Change situations that cause you  stress. Try to keep your work and personal routine reasonable.  Do not abuse illegal drugs.  Limit alcohol intake to no more than 1 drink per day for nonpregnant women and 2 drinks per day for men. One drink equals 12 ounces of beer, 5 ounces of wine, or 1 ounces of hard liquor.  Take a multivitamin, if directed by your health care provider. SEEK MEDICAL CARE IF:   Your fatigue does not get better.  You have a fever.   You have unintentional weight loss or gain.  You have headaches.   You have difficulty:   Falling asleep.  Sleeping throughout the night.  You feel angry, guilty, anxious, or sad.   You are unable to have a bowel movement (constipation).   You skin is dry.   Your legs or another part of your body is swollen.  SEEK IMMEDIATE MEDICAL CARE IF:   You feel confused.   Your vision is blurry.  You feel faint or pass out.   You have a severe headache.   You have severe abdominal, pelvic, or back pain.   You have chest pain, shortness of breath, or an irregular or fast heartbeat.   You are unable to urinate or you urinate less than normal.   You develop abnormal bleeding, such as bleeding from the rectum, vagina, nose, lungs, or nipples.  You vomit blood.   You have thoughts about harming yourself or committing suicide.   You are  worried that you might harm someone else.    This information is not intended to replace advice given to you by your health care provider. Make sure you discuss any questions you have with your health care provider.   Document Released: 07/20/2007 Document Revised: 10/13/2014 Document Reviewed: 01/24/2014 Elsevier Interactive Patient Education [employer].

## 2015-11-30 NOTE — Progress Notes (Signed)
Assessment and Plan:  1. Hypertension -Continue medication, monitor blood pressure at home. Continue DASH diet.  Reminder to go to the ER if any CP, SOB, nausea, dizziness, severe HA, changes vision/speech, left arm numbness and tingling and jaw pain.  2. Cholesterol -Continue diet and exercise. Check cholesterol, not willing to get on livalo, willing to try zetia.   3. Prediabetes  -Continue diet and exercise. Check A1C  4. Vitamin D Def - check level and continue medications.   5. Rash Cream sent in for possible tinea, check RPR  Continue diet and meds as discussed. Further disposition pending results of labs. Over 30 minutes of exam, counseling, chart review, and critical decision making was performed  HPI 65 y.o. female  presents for 3 month follow up on hypertension, cholesterol, prediabetes, and vitamin D deficiency.   Her blood pressure has been controlled at home, today their BP is BP: 126/68 mmHg  She does not workout. She denies chest pain, shortness of breath, dizziness.  She is not on cholesterol medication, suppose to be on livalo but stopped due to memory, she has been on zetia.  Her cholesterol is at goal. The cholesterol last visit was:   Lab Results  Component Value Date   CHOL 264* 08/28/2015   HDL 48 08/28/2015   LDLCALC 180* 08/28/2015   TRIG 179* 08/28/2015   CHOLHDL 5.5* 08/28/2015    She has been working on diet and exercise for prediabetes, and denies paresthesia of the feet, polydipsia, polyuria and visual disturbances. Last A1C in the office was:  Lab Results  Component Value Date   HGBA1C 6.0* 08/17/2015   She does not sleep well at night, is up several times in the night to use the rest room. Has history  Has some morning confusion occ, has some short term issues, she has decreased her wine/drinking which has helped some.  Lab Results  Component Value Date   ALT 29 08/28/2015   AST 20 08/28/2015   ALKPHOS 65 08/28/2015   BILITOT 0.7 08/28/2015    Patient is on Vitamin D supplement.   Lab Results  Component Value Date   VD25OH 37 08/17/2015     Has had rash on bilateral hands, itching, has area on left palm 29mmx8mm erythematous patch well circumsribed.  BMI is Body mass index is 26.08 kg/(m^2)., she is working on diet and exercise. Wt Readings from Last 3 Encounters:  11/30/15 147 lb 3.2 oz (66.769 kg)  08/28/15 148 lb (67.132 kg)  08/17/15 151 lb (68.493 kg)    Current Medications:  Current Outpatient Prescriptions on File Prior to Visit  Medication Sig Dispense Refill  . ADVAIR DISKUS 250-50 MCG/DOSE AEPB INHALE 1 INHALATION BY MOUTH 2 TIMES DAILY, RINSE MOUTH AFTER INHALATION 180 each 2  . albuterol (PROAIR HFA) 108 (90 BASE) MCG/ACT inhaler Inhale 2 puffs into the lungs every 6 (six) hours as needed for wheezing or shortness of breath. 18 g 3  . ALPRAZolam (XANAX) 0.5 MG tablet Take 1 tablet (0.5 mg total) by mouth 2 (two) times daily as needed for anxiety. 60 tablet 0  . buPROPion (WELLBUTRIN XL) 300 MG 24 hr tablet TAKE 1 TABLET BY MOUTH EVERY DAY 90 tablet 2  . Cholecalciferol (VITAMIN D PO) Take 1,000 Units by mouth.    . citalopram (CELEXA) 40 MG tablet TAKE 1 TABLET (40 MG TOTAL) BY MOUTH DAILY. 90 tablet 0  . ezetimibe (ZETIA) 10 MG tablet Take 1 tablet (10 mg total) by mouth daily.  90 tablet 1  . levocetirizine (XYZAL) 5 MG tablet Take 1 tablet (5 mg total) by mouth every evening. 30 tablet 9  . Magnesium 250 MG TABS Take by mouth daily.    . montelukast (SINGULAIR) 10 MG tablet TAKE 1 TABLET (10 MG TOTAL) BY MOUTH AT BEDTIME. 30 tablet 2  . Omega-3 Fatty Acids (FISH OIL) 1000 MG CAPS Take by mouth daily.    [name] OVER THE COUNTER MEDICATION Eye drop 1 drop each eye daily.    . valACYclovir (VALTREX) 500 MG tablet Take 1 tablet (500 mg total) by mouth daily. 90 tablet 1   Current Facility-Administered Medications on File Prior to Visit  Medication Dose Route Frequency Provider Last Rate Last Dose  . ipratropium  (ATROVENT) nebulizer solution 0.5 mg  0.5 mg Nebulization Once Elby Beck, FNP       Medical History:  Past Medical History  Diagnosis Date  . Serum calcium elevated   . Anxiety   . Elevated cholesterol   . RBBB (right bundle branch block with left anterior fascicular block)   . Thyroid disease   . Fatigue   . Depression   . Asthma   . COPD (chronic obstructive pulmonary disease) ([place])   . Scoliosis    Allergies:  Allergies  Allergen Reactions  . Lipitor [Atorvastatin]     Myalgias      Review of Systems:  Review of Systems  Constitutional: Positive for malaise/fatigue. Negative for fever, chills, weight loss and diaphoresis.  HENT: Positive for congestion. Negative for ear discharge, ear pain, hearing loss, nosebleeds, sore throat and tinnitus.        + snoring  Eyes: Negative.   Respiratory: Negative.  Negative for stridor.   Cardiovascular: Negative.   Gastrointestinal: Negative.   Genitourinary: Negative.   Musculoskeletal: Negative.   Skin: Negative.   Neurological: Negative.  Negative for weakness and headaches.  Psychiatric/Behavioral: Negative.     Family history- Review and unchanged Social history- Review and unchanged Physical Exam: BP 126/68 mmHg  Pulse 68  Temp(Src) 97.7 F (36.5 C) (Temporal)  Resp 16  Ht 5\' 3"  (1.6 m)  Wt 147 lb 3.2 oz (66.769 kg)  BMI 26.08 kg/m2  SpO2 96% Wt Readings from Last 3 Encounters:  11/30/15 147 lb 3.2 oz (66.769 kg)  08/28/15 148 lb (67.132 kg)  08/17/15 151 lb (68.493 kg)   General Appearance: Well nourished, in no apparent distress. Eyes: PERRLA, EOMs, conjunctiva no swelling or erythema Sinuses: No Frontal/maxillary tenderness ENT/Mouth: Ext aud canals clear, TMs without erythema, bulging. No erythema, swelling, or exudate on post pharynx.  Tonsils not swollen or erythematous. Hearing normal.  Neck: Supple, thyroid normal.  Respiratory: Respiratory effort normal, BS equal bilaterally without rales,  rhonchi, wheezing or stridor.  Cardio: RRR with no MRGs. Brisk peripheral pulses without edema.  Abdomen: Soft, + BS,  Non tender, no guarding, rebound, hernias, masses. Lymphatics: Non tender without lymphadenopathy.  Musculoskeletal: Full ROM, 5/5 strength, Normal gait Skin: left palm 43mmx8mm erythematous patch well circumsribed, right palm with mild dry erythema. Warm, dry without rashes, lesions, ecchymosis.  Neuro: Cranial nerves intact. Normal muscle tone, no cerebellar symptoms. Psych: Awake and oriented X 3, normal affect, Insight and Judgment appropriate.    Vicie Mutters, PA-C 9:17 AM [facility]

## 2015-12-01 LAB — VITAMIN D 25 HYDROXY (VIT D DEFICIENCY, FRACTURES): Vit D, 25-Hydroxy: 36 ng/mL (ref 30–100)

## 2015-12-01 LAB — TSH: TSH: 1.8 mIU/L

## 2015-12-01 LAB — RPR

## 2015-12-15 ENCOUNTER — Other Ambulatory Visit: Payer: Self-pay | Admitting: Internal Medicine

## 2016-01-14 ENCOUNTER — Other Ambulatory Visit: Payer: Self-pay

## 2016-01-14 DIAGNOSIS — Z1231 Encounter for screening mammogram for malignant neoplasm of breast: Secondary | ICD-10-CM

## 2016-01-31 ENCOUNTER — Other Ambulatory Visit: Payer: Self-pay | Admitting: Internal Medicine

## 2016-01-31 ENCOUNTER — Encounter: Payer: Self-pay | Admitting: Physician Assistant

## 2016-01-31 MED ORDER — CYCLOBENZAPRINE HCL 10 MG PO TABS: 10.0000 mg | ORAL_TABLET | Freq: Every day | ORAL | Status: AC

## 2016-02-18 ENCOUNTER — Ambulatory Visit
Admission: RE | Admit: 2016-02-18 | Discharge: 2016-02-18 | Disposition: A | Discharge status: Home / Self Care | Payer: BC Managed Care – PPO | Source: Ambulatory Visit

## 2016-02-18 DIAGNOSIS — Z1231 Encounter for screening mammogram for malignant neoplasm of breast: Secondary | ICD-10-CM

## 2016-02-26 ENCOUNTER — Other Ambulatory Visit: Payer: Self-pay | Admitting: Physician Assistant

## 2016-02-26 DIAGNOSIS — R928 Other abnormal and inconclusive findings on diagnostic imaging of breast: Secondary | ICD-10-CM

## 2016-03-08 ENCOUNTER — Other Ambulatory Visit: Payer: Self-pay | Admitting: Internal Medicine

## 2016-03-12 ENCOUNTER — Other Ambulatory Visit: Payer: Self-pay | Admitting: Physician Assistant

## 2016-03-12 ENCOUNTER — Ambulatory Visit
Admission: RE | Admit: 2016-03-12 | Discharge: 2016-03-12 | Disposition: A | Discharge status: Home / Self Care | Payer: BC Managed Care – PPO | Source: Ambulatory Visit | Attending: Physician Assistant | Admitting: Physician Assistant

## 2016-03-12 DIAGNOSIS — R928 Other abnormal and inconclusive findings on diagnostic imaging of breast: Secondary | ICD-10-CM

## 2016-03-18 ENCOUNTER — Ambulatory Visit
Admission: RE | Admit: 2016-03-18 | Discharge: 2016-03-18 | Disposition: A | Discharge status: Home / Self Care | Payer: BC Managed Care – PPO | Source: Ambulatory Visit | Attending: Physician Assistant | Admitting: Physician Assistant

## 2016-03-18 DIAGNOSIS — R928 Other abnormal and inconclusive findings on diagnostic imaging of breast: Secondary | ICD-10-CM

## 2016-03-31 ENCOUNTER — Other Ambulatory Visit: Payer: Self-pay | Admitting: General Surgery

## 2016-03-31 DIAGNOSIS — R922 Inconclusive mammogram: Secondary | ICD-10-CM

## 2016-04-14 ENCOUNTER — Ambulatory Visit
Admission: RE | Admit: 2016-04-14 | Discharge: 2016-04-14 | Disposition: A | Discharge status: Home / Self Care | Payer: BC Managed Care – PPO | Source: Ambulatory Visit | Attending: General Surgery | Admitting: General Surgery

## 2016-04-14 DIAGNOSIS — R922 Inconclusive mammogram: Secondary | ICD-10-CM

## 2016-04-14 MED ORDER — GADOBENATE DIMEGLUMINE 529 MG/ML IV SOLN
13.0000 mL | Freq: Once | INTRAVENOUS | Status: AC | PRN
  Administered 2016-04-14: 13 mL via INTRAVENOUS

## 2016-04-21 ENCOUNTER — Other Ambulatory Visit: Payer: Self-pay | Admitting: General Surgery

## 2016-04-21 DIAGNOSIS — N6310 Unspecified lump in the right breast, unspecified quadrant: Secondary | ICD-10-CM

## 2016-04-28 ENCOUNTER — Other Ambulatory Visit: Payer: Self-pay | Admitting: Physician Assistant

## 2016-04-29 ENCOUNTER — Other Ambulatory Visit: Payer: Self-pay | Admitting: General Surgery

## 2016-04-29 DIAGNOSIS — N6310 Unspecified lump in the right breast, unspecified quadrant: Secondary | ICD-10-CM

## 2016-05-02 ENCOUNTER — Encounter: Payer: Self-pay | Admitting: Physician Assistant

## 2016-05-02 ENCOUNTER — Ambulatory Visit (INDEPENDENT_AMBULATORY_CARE_PROVIDER_SITE_OTHER): Payer: BC Managed Care – PPO | Admitting: Physician Assistant

## 2016-05-02 VITALS — BP 128/74 | HR 64 | Temp 97.9°F | Resp 14 | Ht 63.0 in | Wt 149.2 lb

## 2016-05-02 DIAGNOSIS — F419 Anxiety disorder, unspecified: Secondary | ICD-10-CM | POA: Diagnosis not present

## 2016-05-02 DIAGNOSIS — I452 Bifascicular block: Secondary | ICD-10-CM

## 2016-05-02 DIAGNOSIS — Z136 Encounter for screening for cardiovascular disorders: Secondary | ICD-10-CM | POA: Diagnosis not present

## 2016-05-02 DIAGNOSIS — F329 Major depressive disorder, single episode, unspecified: Secondary | ICD-10-CM | POA: Diagnosis not present

## 2016-05-02 DIAGNOSIS — J4520 Mild intermittent asthma, uncomplicated: Secondary | ICD-10-CM

## 2016-05-02 DIAGNOSIS — E039 Hypothyroidism, unspecified: Secondary | ICD-10-CM | POA: Diagnosis not present

## 2016-05-02 DIAGNOSIS — R7303 Prediabetes: Secondary | ICD-10-CM

## 2016-05-02 DIAGNOSIS — F32A Depression, unspecified: Secondary | ICD-10-CM

## 2016-05-02 DIAGNOSIS — D649 Anemia, unspecified: Secondary | ICD-10-CM

## 2016-05-02 DIAGNOSIS — Z0001 Encounter for general adult medical examination with abnormal findings: Secondary | ICD-10-CM

## 2016-05-02 DIAGNOSIS — Z9189 Other specified personal risk factors, not elsewhere classified: Secondary | ICD-10-CM

## 2016-05-02 DIAGNOSIS — I10 Essential (primary) hypertension: Secondary | ICD-10-CM

## 2016-05-02 DIAGNOSIS — Z79899 Other long term (current) drug therapy: Secondary | ICD-10-CM

## 2016-05-02 DIAGNOSIS — E559 Vitamin D deficiency, unspecified: Secondary | ICD-10-CM

## 2016-05-02 DIAGNOSIS — Z87898 Personal history of other specified conditions: Secondary | ICD-10-CM

## 2016-05-02 DIAGNOSIS — R6889 Other general symptoms and signs: Secondary | ICD-10-CM

## 2016-05-02 DIAGNOSIS — E782 Mixed hyperlipidemia: Secondary | ICD-10-CM | POA: Diagnosis not present

## 2016-05-02 DIAGNOSIS — J449 Chronic obstructive pulmonary disease, unspecified: Secondary | ICD-10-CM

## 2016-05-02 DIAGNOSIS — Z23 Encounter for immunization: Secondary | ICD-10-CM | POA: Diagnosis not present

## 2016-05-02 DIAGNOSIS — M8580 Other specified disorders of bone density and structure, unspecified site: Secondary | ICD-10-CM | POA: Insufficient documentation

## 2016-05-02 LAB — HEPATIC FUNCTION PANEL
ALK PHOS: 69 U/L (ref 33–130)
ALT: 33 U/L — AB (ref 6–29)
AST: 21 U/L (ref 10–35)
Albumin: 4.6 g/dL (ref 3.6–5.1)
BILIRUBIN INDIRECT: 0.7 mg/dL (ref 0.2–1.2)
Bilirubin, Direct: 0.1 mg/dL (ref <=0.2)
TOTAL PROTEIN: 7.1 g/dL (ref 6.1–8.1)
Total Bilirubin: 0.8 mg/dL (ref 0.2–1.2)

## 2016-05-02 LAB — BASIC METABOLIC PANEL WITH GFR
BUN: 13 mg/dL (ref 7–25)
CALCIUM: 10.7 mg/dL — AB (ref 8.6–10.4)
CO2: 26 mmol/L (ref 20–31)
Chloride: 102 mmol/L (ref 98–110)
Creat: 0.87 mg/dL (ref 0.50–0.99)
GFR, EST NON AFRICAN AMERICAN: 71 mL/min (ref >=60)
GFR, Est African American: 81 mL/min (ref >=60)
GLUCOSE: 116 mg/dL — AB (ref 65–99)
Potassium: 4.6 mmol/L (ref 3.5–5.3)
Sodium: 137 mmol/L (ref 135–146)

## 2016-05-02 LAB — MAGNESIUM: MAGNESIUM: 1.8 mg/dL (ref 1.5–2.5)

## 2016-05-02 LAB — CBC WITH DIFFERENTIAL/PLATELET
BASOS ABS: 0 {cells}/uL (ref 0–200)
Basophils Relative: 0 %
EOS ABS: 116 {cells}/uL (ref 15–500)
Eosinophils Relative: 2 %
HEMATOCRIT: 43.6 % (ref 35.0–45.0)
HEMOGLOBIN: 14.7 g/dL (ref 11.7–15.5)
LYMPHS ABS: 1624 {cells}/uL (ref 850–3900)
Lymphocytes Relative: 28 %
MCH: 32.6 pg (ref 27.0–33.0)
MCHC: 33.7 g/dL (ref 32.0–36.0)
MCV: 96.7 fL (ref 80.0–100.0)
MONO ABS: 406 {cells}/uL (ref 200–950)
MPV: 9.7 fL (ref 7.5–12.5)
Monocytes Relative: 7 %
NEUTROS PCT: 63 %
Neutro Abs: 3654 cells/uL (ref 1500–7800)
Platelets: 240 10*3/uL (ref 140–400)
RBC: 4.51 MIL/uL (ref 3.80–5.10)
RDW: 13.5 % (ref 11.0–15.0)
WBC: 5.8 10*3/uL (ref 3.8–10.8)

## 2016-05-02 LAB — LIPID PANEL
CHOLESTEROL: 216 mg/dL — AB (ref 125–200)
HDL: 55 mg/dL (ref >=46)
LDL CALC: 121 mg/dL (ref <130)
Total CHOL/HDL Ratio: 3.9 Ratio (ref <=5.0)
Triglycerides: 201 mg/dL — ABNORMAL HIGH (ref <150)
VLDL: 40 mg/dL — AB (ref <30)

## 2016-05-02 LAB — IRON AND TIBC
%SAT: 36 % (ref 11–50)
Iron: 120 ug/dL (ref 45–160)
TIBC: 335 ug/dL (ref 250–450)
UIBC: 215 ug/dL (ref 125–400)

## 2016-05-02 LAB — TSH: TSH: 1.97 mIU/L

## 2016-05-02 LAB — VITAMIN B12: VITAMIN B 12: 1329 pg/mL — AB (ref 200–1100)

## 2016-05-02 LAB — HEMOGLOBIN A1C
Hgb A1c MFr Bld: 5.7 % — ABNORMAL HIGH (ref <5.7)
MEAN PLASMA GLUCOSE: 117 mg/dL

## 2016-05-02 LAB — FERRITIN: Ferritin: 416 ng/mL — ABNORMAL HIGH (ref 20–288)

## 2016-05-02 NOTE — Progress Notes (Signed)
Complete Physical  Assessment and Plan:  Hypothyroidism, unspecified hypothyroidism type Hypothyroidism-check TSH level, continue medications the same, reminded to take on an empty stomach 30-80mins before food.  -     TSH  Hyperlipidemia -continue medications, check lipids, decrease fatty foods, increase activity.  -     Lipid panel -     Urinalysis, Routine w reflex microscopic (not at [facility]) -     Microalbumin / creatinine urine ratio -     EKG 12-Lead  Depression Depression- continue medications, stress management techniques discussed, increase water, good sleep hygiene discussed, increase exercise, and increase veggies.   Chronic obstructive pulmonary disease, unspecified COPD type ([place]) Continue medications.   Prediabetes -     Hemoglobin A1c  Vitamin D deficiency -     VITAMIN D 25 Hydroxy (Vit-D Deficiency, Fractures)  RBBB (right bundle branch block with left anterior fascicular block) No changes  Asthma, mild intermittent, uncomplicated Continue medications  At risk for coronary artery disease Control blood pressure, cholesterol, glucose, increase exercise.   Anxiety continue medications, stress management techniques discussed, increase water, good sleep hygiene discussed, increase exercise, and increase veggies.   Osteopenia Osteopenia- get dexa, continue Vit D and Ca, weight bearing exercises  Medication management -     CBC with Differential/Platelet -     BASIC METABOLIC PANEL WITH GFR -     Hepatic function panel -     Magnesium  Anemia, unspecified anemia type -     Iron and TIBC -     Ferritin -     Vitamin B12  Encounter for general adult medical examination with abnormal findings -     CBC with Differential/Platelet -     BASIC METABOLIC PANEL WITH GFR -     Hepatic function panel -     TSH -     Lipid panel -     Hemoglobin A1c -     Magnesium -     VITAMIN D 25 Hydroxy (Vit-D Deficiency, Fractures) -     Urinalysis, Routine w reflex  microscopic (not at [facility]) -     Microalbumin / creatinine urine ratio -     Iron and TIBC -     Ferritin -     Vitamin B12 -     EKG 12-Lead  Need for diphtheria-tetanus-pertussis (Tdap) vaccine -     Tdap vaccine greater than or equal to 7yo IM    Discussed med's effects and SE's. Screening labs and tests as requested with regular follow-up as recommended. Over 40 minutes of exam, counseling, chart review, and complex, high level critical decision making was performed this visit.   HPI  65 y.o. female  presents for a complete physical.  Her blood pressure has been controlled at home, today their BP is BP: 128/74 She does not workout, trying to walk. She denies chest pain, shortness of breath, dizziness.  She has COPD, is on advair.  She is on cholesterol medication due to myalgias with statins, but has tolerated livalo.  Her cholesterol is at goal. The cholesterol last visit was:   Lab Results  Component Value Date   CHOL 225 (H) 11/30/2015   HDL 49 11/30/2015   LDLCALC 143 (H) 11/30/2015   TRIG 165 (H) 11/30/2015   CHOLHDL 4.6 11/30/2015   She has been working on diet and exercise for prediabetes,  and denies paresthesia of the feet, polydipsia, polyuria and visual disturbances. Last A1C in the office was:  Lab Results  Component Value Date   HGBA1C 5.8 (H) 11/30/2015   Patient is on Vitamin D supplement, 1000 IU daily   Lab Results  Component Value Date   VD25OH 36 11/30/2015     She is on celexa and wellbutrin for anxiety that helps.  She quit smoking Feb 2015, she smoked for 40 + years, last CXR Jan 2016.  She has bilateral dry eyes, has seen her eye doctor.  She had abnormal right breast distortion, had needle biopsy and will have excisional bx on August 10.   Current Medications:  Current Outpatient Prescriptions on File Prior to Visit  Medication Sig Dispense Refill  . ADVAIR DISKUS 250-50 MCG/DOSE AEPB INHALE 1 INHALATION BY MOUTH 2 TIMES DAILY, RINSE MOUTH  AFTER INHALATION 180 each 2  . albuterol (PROAIR HFA) 108 (90 BASE) MCG/ACT inhaler Inhale 2 puffs into the lungs every 6 (six) hours as needed for wheezing or shortness of breath. 18 g 3  . ALPRAZolam (XANAX) 0.5 MG tablet Take 1 tablet (0.5 mg total) by mouth 2 (two) times daily as needed for anxiety. 60 tablet 0  . buPROPion (WELLBUTRIN XL) 300 MG 24 hr tablet TAKE 1 TABLET BY MOUTH EVERY DAY 90 tablet 1  . Cholecalciferol (VITAMIN D PO) Take 1,000 Units by mouth.    . citalopram (CELEXA) 40 MG tablet TAKE 1 TABLET (40 MG TOTAL) BY MOUTH DAILY. 90 tablet 1  . cyclobenzaprine (FLEXERIL) 10 MG tablet Take 1 tablet (10 mg total) by mouth at bedtime. 90 tablet 1  . ezetimibe (ZETIA) 10 MG tablet TAKE 1 TABLET (10 MG TOTAL) BY MOUTH DAILY. 90 tablet 1  . Magnesium 250 MG TABS Take by mouth daily.    . Omega-3 Fatty Acids (FISH OIL) 1000 MG CAPS Take by mouth daily.    [name] OVER THE COUNTER MEDICATION Eye drop 1 drop each eye daily.    . valACYclovir (VALTREX) 500 MG tablet Take 1 tablet (500 mg total) by mouth daily. 90 tablet 1   Current Facility-Administered Medications on File Prior to Visit  Medication Dose Route Frequency Provider Last Rate Last Dose  . ipratropium (ATROVENT) nebulizer solution 0.5 mg  0.5 mg Nebulization Once Elby Beck, FNP       Health Maintenance:   Immunization History  Administered Date(s) Administered  . PPD Test 01/26/2014  . Pneumococcal-Unspecified 12/25/2011  . Td 07/22/2006  . Zoster 01/26/2014   Tdap:07/22/2006 due next year Pneumovax:12/25/11 Prevnar 13 Due at age 89 Zostavax:01/26/14 Influenza: free at work 2016  Colonoscopy:2008 due 2018 Mammo: 03/2016 CAT C, had abnormal right breast, has had needle BX and getting excisional BX August BMD: 2016 osteopenia at gyn Pap/ Pelvic: Nov 2015  At gYN CXR: 07/2015 Ct cardiac score 2013 CT lumbar 2011 Echo 2012  [id number] Dentist:q 6 months Patient Care Team: Unk Pinto, MD as PCP - General  (Internal Medicine) Delila Pereyra, MD as Consulting Physician (Gynecology) Royston Cowper, DDS (Dentistry) Josue Hector, MD as Consulting Physician (Cardiology) Inda Castle, MD as Consulting Physician (Gastroenterology) Lavonna Monarch, MD as Consulting Physician (Dermatology) Jacelyn Pi, MD as Consulting Physician (Endocrinology) Armandina Gemma, MD as Consulting Physician (General Surgery)  Allergies:  Allergies  Allergen Reactions  . Lipitor [Atorvastatin]     Myalgias    Medical History:  Past Medical History:  Diagnosis Date  . Anxiety   . Asthma   . COPD (chronic obstructive pulmonary disease) ([place])   . Depression   . Elevated cholesterol   . Fatigue   .  RBBB (right bundle branch block with left anterior fascicular block)   . Scoliosis   . Serum calcium elevated   . Thyroid disease    Surgical History:  Past Surgical History:  Procedure Laterality Date  . APPENDECTOMY    . LAPROSCOPIC     X 2 IN EARLY 30-40'S  . OVARIAN CYST REMOVAL    . TONSILECTOMY, ADENOIDECTOMY, BILATERAL MYRINGOTOMY AND TUBES     Family History:  Family History  Problem Relation Age of Onset  . Heart failure Mother   . Hypertension Mother   . Hyperlipidemia Mother   . Heart disease Mother   . Liver disease Father   . Alcohol abuse Father    Social History:  Social History  Substance Use Topics  . Smoking status: Former Smoker    Packs/day: 1.00    Types: Cigarettes    Quit date: 11/19/2013  . Smokeless tobacco: Not on file  . Alcohol use 0.0 oz/week     Comment: social   Review of Systems: Review of Systems  Constitutional: Negative.   HENT: Positive for congestion. Negative for ear discharge, ear pain, hearing loss, nosebleeds, sore throat and tinnitus.   Respiratory: Positive for cough. Negative for hemoptysis, sputum production, shortness of breath, wheezing and stridor.   Gastrointestinal: Positive for heartburn. Negative for abdominal pain, blood in stool, constipation,  diarrhea, melena, nausea and vomiting.  Genitourinary: Negative for dysuria, flank pain, frequency, hematuria and urgency.       + incontinence  Musculoskeletal: Negative.   Skin: Negative.   Neurological: Negative.  Negative for headaches.  Psychiatric/Behavioral: Negative.     Physical Exam: Estimated body mass index is 26.43 kg/m as calculated from the following:   Height as of this encounter: 5\' 3"  (1.6 m).   Weight as of this encounter: 149 lb 3.2 oz (67.7 kg). BP 128/74   Pulse 64   Temp 97.9 F (36.6 C) (Temporal)   Resp 14   Ht 5\' 3"  (1.6 m)   Wt 149 lb 3.2 oz (67.7 kg)   SpO2 99%   BMI 26.43 kg/m  General Appearance: Well nourished, in no apparent distress.  Eyes: PERRLA, EOMs, conjunctiva no swelling or erythema, normal fundi and vessels.  Sinuses: No Frontal/maxillary tenderness  ENT/Mouth: Ext aud canals clear, normal light reflex with TMs without erythema, bulging. Good dentition. No erythema, swelling, or exudate on post pharynx. Tonsils not swollen or erythematous. Hearing normal.  Neck: Supple, thyroid normal. No bruits  Respiratory: Respiratory effort normal, BS equal bilaterally without rales, rhonchi, wheezing or stridor.  Cardio: RRR without murmurs, rubs or gallops. Brisk peripheral pulses without edema.  Chest: symmetric, with normal excursions and percussion.  Breasts: Symmetric, without lumps, nipple discharge, retractions.  Abdomen: Soft, nontender, no guarding, rebound, hernias, masses, or organomegaly.  Lymphatics: Non tender without lymphadenopathy.  Genitourinary: defer next year Musculoskeletal: Full ROM all peripheral extremities,5/5 strength, and normal gait.  Skin: Warm, dry without rashes, lesions, ecchymosis. Neuro: Cranial nerves intact, reflexes equal bilaterally. Normal muscle tone, no cerebellar symptoms. Sensation intact.  Psych: Awake and oriented X 3, normal affect, Insight and Judgment appropriate.   EKG: WNL CRBBB, no ST  changes. AORTA SCAN: defer  Vicie Mutters 10:08 AM [facility]

## 2016-05-02 NOTE — Patient Instructions (Signed)
Common causes of cough OR hoarseness OR sore throat:   Allergies, Viral Infections, Acid Reflux and Bacterial Infections.  1) Allergies and viral infections cause a cough OR sore throat by post nasal drip and are often worse at night, can also have sneezing, lower grade fevers, clear/yellow mucus. This is best treated with allergy medications or nasal sprays.  Please get on allegra for 1-2 weeks The strongest is allegra or fexafinadine  Cheapest at [employer], [employer], [employer]  2) Bacterial infections are more severe than allergies or viral infections with fever, teeth pain, fatigue. This can be treated with prednisone and the same over the counter medication and after 7 days can be treated with an antibiotic.   3) Silent reflux/GERD can cause a cough OR sore throat OR hoarseness WITHOUT heart burn because the esophagus that goes to the stomach and trachea that goes to the lungs are very close and when you lay down the acid can irritate your throat and lungs. This can cause hoarseness, cough, and wheezing. Please stop any alcohol or anti-inflammatories like aleve/advil/ibuprofen and start an over the counter Prilosec or omeprazole 1-2 times daily 60mins before food for 2 weeks, then switch to over the counter zantac/ratinidine or pepcid/famotadine once at night for 2 weeks.   4) sometimes irritation causes more irritation. Try voice rest, use sugar free cough drops to prevent coughing, and try to stop clearing your throat.   If you ever have a cough that does not go away after trying these things please make a follow up visit for further evaluation or we can refer you to a specialist. Or if you ever have shortness of breath or chest pain go to the ER.    Your ears and sinuses are connected by the eustachian tube. When your sinuses are inflamed, this can close off the tube and cause fluid to collect in your middle ear. This can then cause dizziness, popping, clicking, ringing, and echoing in your ears.  This is often NOT an infection and does NOT require antibiotics, it is caused by inflammation so the treatments help the inflammation. This can take a long time to get better so please be patient.  Here are things you can do to help with this: - Try the Flonase or Nasonex. Remember to spray each nostril twice towards the outer part of your eye.  Do not sniff but instead pinch your nose and tilt your head back to help the medicine get into your sinuses.  The best time to do this is at bedtime.Stop if you get blurred vision or nose bleeds.  -While drinking fluids, pinch and hold nose close and swallow, to help open eustachian tubes to drain fluid behind ear drums. -Please pick one of the over the counter allergy medications below and take it once daily for allergies.  It will also help with fluid behind ear drums. Claritin or loratadine cheapest but likely the weakest  Zyrtec or certizine at night because it can make you sleepy The strongest is allegra or fexafinadine  Cheapest at [employer], [employer], [employer] -can use decongestant over the counter, please do not use if you have high blood pressure or certain heart conditions.   if worsening HA, changes vision/speech, imbalance, weakness go to the ER

## 2016-05-03 LAB — VITAMIN D 25 HYDROXY (VIT D DEFICIENCY, FRACTURES): Vit D, 25-Hydroxy: 60 ng/mL (ref 30–100)

## 2016-05-03 LAB — URINALYSIS, ROUTINE W REFLEX MICROSCOPIC
Bilirubin Urine: NEGATIVE
GLUCOSE, UA: NEGATIVE
HGB URINE DIPSTICK: NEGATIVE
KETONES UR: NEGATIVE
Nitrite: NEGATIVE
PH: 7 (ref 5.0–8.0)
Protein, ur: NEGATIVE
Specific Gravity, Urine: 1.011 (ref 1.001–1.035)

## 2016-05-03 LAB — URINALYSIS, MICROSCOPIC ONLY
Bacteria, UA: NONE SEEN [HPF]
Casts: NONE SEEN [LPF]
Crystals: NONE SEEN [HPF]
RBC / HPF: NONE SEEN RBC/HPF (ref <=2)
WBC UA: NONE SEEN WBC/HPF (ref <=5)
Yeast: NONE SEEN [HPF]

## 2016-05-05 LAB — MICROALBUMIN / CREATININE URINE RATIO
Creatinine, Urine: 64 mg/dL (ref 20–320)
Microalb Creat Ratio: 3 mcg/mg creat (ref <30)
Microalb, Ur: 0.2 mg/dL

## 2016-05-09 ENCOUNTER — Encounter (HOSPITAL_BASED_OUTPATIENT_CLINIC_OR_DEPARTMENT_OTHER): Payer: Self-pay | Admitting: *Deleted

## 2016-05-14 ENCOUNTER — Ambulatory Visit
Admission: RE | Admit: 2016-05-14 | Discharge: 2016-05-14 | Disposition: A | Discharge status: Home / Self Care | Payer: BC Managed Care – PPO | Source: Ambulatory Visit | Attending: General Surgery | Admitting: General Surgery

## 2016-05-14 DIAGNOSIS — N6310 Unspecified lump in the right breast, unspecified quadrant: Secondary | ICD-10-CM

## 2016-05-15 ENCOUNTER — Encounter (HOSPITAL_BASED_OUTPATIENT_CLINIC_OR_DEPARTMENT_OTHER): Payer: Self-pay | Admitting: Certified Registered"

## 2016-05-15 ENCOUNTER — Ambulatory Visit
Admission: RE | Admit: 2016-05-15 | Discharge: 2016-05-15 | Disposition: A | Discharge status: Home / Self Care | Payer: BC Managed Care – PPO | Source: Ambulatory Visit | Attending: General Surgery | Admitting: General Surgery

## 2016-05-15 ENCOUNTER — Encounter (HOSPITAL_BASED_OUTPATIENT_CLINIC_OR_DEPARTMENT_OTHER)
Admission: RE | Disposition: A | Discharge status: Home / Self Care | Payer: Self-pay | Source: Ambulatory Visit | Attending: General Surgery

## 2016-05-15 ENCOUNTER — Ambulatory Visit (HOSPITAL_BASED_OUTPATIENT_CLINIC_OR_DEPARTMENT_OTHER): Payer: BC Managed Care – PPO | Admitting: Certified Registered"

## 2016-05-15 ENCOUNTER — Ambulatory Visit (HOSPITAL_BASED_OUTPATIENT_CLINIC_OR_DEPARTMENT_OTHER)
Admission: RE | Admit: 2016-05-15 | Discharge: 2016-05-15 | Disposition: A | Discharge status: Home / Self Care | Payer: BC Managed Care – PPO | Source: Ambulatory Visit | Attending: General Surgery | Admitting: General Surgery

## 2016-05-15 DIAGNOSIS — J45909 Unspecified asthma, uncomplicated: Secondary | ICD-10-CM | POA: Insufficient documentation

## 2016-05-15 DIAGNOSIS — N62 Hypertrophy of breast: Secondary | ICD-10-CM | POA: Diagnosis not present

## 2016-05-15 DIAGNOSIS — J449 Chronic obstructive pulmonary disease, unspecified: Secondary | ICD-10-CM | POA: Diagnosis not present

## 2016-05-15 DIAGNOSIS — Z87891 Personal history of nicotine dependence: Secondary | ICD-10-CM | POA: Diagnosis not present

## 2016-05-15 DIAGNOSIS — N6310 Unspecified lump in the right breast, unspecified quadrant: Secondary | ICD-10-CM

## 2016-05-15 DIAGNOSIS — N63 Unspecified lump in breast: Secondary | ICD-10-CM | POA: Diagnosis present

## 2016-05-15 HISTORY — PX: RADIOACTIVE SEED GUIDED EXCISIONAL BREAST BIOPSY: SHX6490

## 2016-05-15 SURGERY — RADIOACTIVE SEED GUIDED BREAST BIOPSY
Anesthesia: General | Site: Breast | Laterality: Right

## 2016-05-15 MED ORDER — GLYCOPYRROLATE 0.2 MG/ML IJ SOLN: 0.2000 mg | Freq: Once | INTRAMUSCULAR | Status: DC | PRN

## 2016-05-15 MED ORDER — CEFAZOLIN SODIUM-DEXTROSE 2-4 GM/100ML-% IV SOLN
2.0000 g | INTRAVENOUS | Status: AC
  Administered 2016-05-15: 2 g via INTRAVENOUS

## 2016-05-15 MED ORDER — GABAPENTIN 300 MG PO CAPS
ORAL_CAPSULE | ORAL | Status: AC
  Filled 2016-05-15: qty 1

## 2016-05-15 MED ORDER — DEXAMETHASONE SODIUM PHOSPHATE 4 MG/ML IJ SOLN
INTRAMUSCULAR | Status: DC | PRN
  Administered 2016-05-15: 10 mg via INTRAVENOUS

## 2016-05-15 MED ORDER — OXYCODONE HCL 5 MG PO TABS
5.0000 mg | ORAL_TABLET | Freq: Once | ORAL | Status: AC | PRN
  Administered 2016-05-15: 5 mg via ORAL

## 2016-05-15 MED ORDER — PROPOFOL 500 MG/50ML IV EMUL
INTRAVENOUS | Status: AC
  Filled 2016-05-15: qty 50

## 2016-05-15 MED ORDER — GABAPENTIN 300 MG PO CAPS
300.0000 mg | ORAL_CAPSULE | ORAL | Status: AC
  Administered 2016-05-15: 300 mg via ORAL

## 2016-05-15 MED ORDER — ONDANSETRON HCL 4 MG/2ML IJ SOLN
INTRAMUSCULAR | Status: AC
  Filled 2016-05-15: qty 2

## 2016-05-15 MED ORDER — ONDANSETRON HCL 4 MG/2ML IJ SOLN
INTRAMUSCULAR | Status: DC | PRN
Start: 2016-05-15 — End: 2016-05-15
  Administered 2016-05-15: 4 mg via INTRAVENOUS

## 2016-05-15 MED ORDER — MIDAZOLAM HCL 2 MG/2ML IJ SOLN
1.0000 mg | INTRAMUSCULAR | Status: DC | PRN
  Administered 2016-05-15: 1 mg via INTRAVENOUS

## 2016-05-15 MED ORDER — ONDANSETRON HCL 4 MG/2ML IJ SOLN: 4.0000 mg | Freq: Once | INTRAMUSCULAR | Status: DC | PRN

## 2016-05-15 MED ORDER — HYDROMORPHONE HCL 1 MG/ML IJ SOLN
INTRAMUSCULAR | Status: AC
  Filled 2016-05-15: qty 1

## 2016-05-15 MED ORDER — CELECOXIB 400 MG PO CAPS
400.0000 mg | ORAL_CAPSULE | ORAL | Status: AC
  Administered 2016-05-15: 400 mg via ORAL

## 2016-05-15 MED ORDER — DEXAMETHASONE SODIUM PHOSPHATE 10 MG/ML IJ SOLN
INTRAMUSCULAR | Status: AC
  Filled 2016-05-15: qty 1

## 2016-05-15 MED ORDER — OXYCODONE HCL 5 MG/5ML PO SOLN: 5.0000 mg | Freq: Once | ORAL | Status: AC | PRN

## 2016-05-15 MED ORDER — CELECOXIB 200 MG PO CAPS
ORAL_CAPSULE | ORAL | Status: AC
  Filled 2016-05-15: qty 2

## 2016-05-15 MED ORDER — FENTANYL CITRATE (PF) 100 MCG/2ML IJ SOLN
50.0000 ug | INTRAMUSCULAR | Status: DC | PRN
  Administered 2016-05-15 (×2): 50 ug via INTRAVENOUS

## 2016-05-15 MED ORDER — OXYCODONE HCL 5 MG PO TABS
ORAL_TABLET | ORAL | Status: AC
  Filled 2016-05-15: qty 1

## 2016-05-15 MED ORDER — HYDROCODONE-ACETAMINOPHEN 10-325 MG PO TABS: 1.0000 | ORAL_TABLET | Freq: Four times a day (QID) | ORAL | 0 refills | Status: DC | PRN

## 2016-05-15 MED ORDER — FENTANYL CITRATE (PF) 100 MCG/2ML IJ SOLN
INTRAMUSCULAR | Status: AC
  Filled 2016-05-15: qty 2

## 2016-05-15 MED ORDER — PHENYLEPHRINE HCL 10 MG/ML IJ SOLN
INTRAMUSCULAR | Status: DC | PRN
  Administered 2016-05-15 (×2): 40 ug via INTRAVENOUS

## 2016-05-15 MED ORDER — MEPERIDINE HCL 25 MG/ML IJ SOLN: 6.2500 mg | INTRAMUSCULAR | Status: DC | PRN

## 2016-05-15 MED ORDER — LACTATED RINGERS IV SOLN
INTRAVENOUS | Status: DC
  Administered 2016-05-15 (×2): via INTRAVENOUS

## 2016-05-15 MED ORDER — CEFAZOLIN SODIUM-DEXTROSE 2-4 GM/100ML-% IV SOLN
INTRAVENOUS | Status: AC
  Filled 2016-05-15: qty 100

## 2016-05-15 MED ORDER — MIDAZOLAM HCL 2 MG/2ML IJ SOLN
INTRAMUSCULAR | Status: AC
  Filled 2016-05-15: qty 2

## 2016-05-15 MED ORDER — BUPIVACAINE HCL (PF) 0.25 % IJ SOLN
INTRAMUSCULAR | Status: DC | PRN
  Administered 2016-05-15: 10 mL

## 2016-05-15 MED ORDER — ACETAMINOPHEN 500 MG PO TABS
1000.0000 mg | ORAL_TABLET | ORAL | Status: AC
  Administered 2016-05-15: 1000 mg via ORAL

## 2016-05-15 MED ORDER — PROPOFOL 10 MG/ML IV BOLUS
INTRAVENOUS | Status: DC | PRN
  Administered 2016-05-15: 200 mg via INTRAVENOUS

## 2016-05-15 MED ORDER — LIDOCAINE 2% (20 MG/ML) 5 ML SYRINGE
INTRAMUSCULAR | Status: DC | PRN
  Administered 2016-05-15: 80 mg via INTRAVENOUS

## 2016-05-15 MED ORDER — ONDANSETRON 8 MG PO TBDP: 8.0000 mg | ORAL_TABLET | Freq: Three times a day (TID) | ORAL | 0 refills | Status: DC | PRN

## 2016-05-15 MED ORDER — LIDOCAINE 2% (20 MG/ML) 5 ML SYRINGE
INTRAMUSCULAR | Status: AC
  Filled 2016-05-15: qty 5

## 2016-05-15 MED ORDER — ACETAMINOPHEN 500 MG PO TABS
ORAL_TABLET | ORAL | Status: AC
  Filled 2016-05-15: qty 2

## 2016-05-15 MED ORDER — HYDROMORPHONE HCL 1 MG/ML IJ SOLN
0.2500 mg | INTRAMUSCULAR | Status: DC | PRN
  Administered 2016-05-15 (×2): 0.5 mg via INTRAVENOUS

## 2016-05-15 MED ORDER — SCOPOLAMINE 1 MG/3DAYS TD PT72: 1.0000 | MEDICATED_PATCH | Freq: Once | TRANSDERMAL | Status: DC

## 2016-05-15 SURGICAL SUPPLY — 55 items
BINDER BREAST LRG (GAUZE/BANDAGES/DRESSINGS) ×3 IMPLANT
BINDER BREAST MEDIUM (GAUZE/BANDAGES/DRESSINGS) IMPLANT
BINDER BREAST XLRG (GAUZE/BANDAGES/DRESSINGS) ×3 IMPLANT
BINDER BREAST XXLRG (GAUZE/BANDAGES/DRESSINGS) IMPLANT
BLADE SURG 15 STRL LF DISP TIS (BLADE) ×1 IMPLANT
BLADE SURG 15 STRL SS (BLADE) ×2
CANISTER SUC SOCK COL 7IN (MISCELLANEOUS) IMPLANT
CANISTER SUCT 1200ML W/VALVE (MISCELLANEOUS) IMPLANT
CHLORAPREP W/TINT 26ML (MISCELLANEOUS) ×3 IMPLANT
CLIP TI WIDE RED SMALL 6 (CLIP) IMPLANT
CLOSURE WOUND 1/2 X4 (GAUZE/BANDAGES/DRESSINGS) ×1
COVER BACK TABLE 60X90IN (DRAPES) ×3 IMPLANT
COVER MAYO STAND STRL (DRAPES) ×3 IMPLANT
COVER PROBE W GEL 5X96 (DRAPES) ×3 IMPLANT
DECANTER SPIKE VIAL GLASS SM (MISCELLANEOUS) IMPLANT
DEVICE DUBIN W/COMP PLATE 8390 (MISCELLANEOUS) ×3 IMPLANT
DRAPE LAPAROSCOPIC ABDOMINAL (DRAPES) ×3 IMPLANT
DRAPE UTILITY XL STRL (DRAPES) ×3 IMPLANT
DRSG TEGADERM 4X4.75 (GAUZE/BANDAGES/DRESSINGS) IMPLANT
ELECT COATED BLADE 2.86 ST (ELECTRODE) ×3 IMPLANT
ELECT REM PT RETURN 9FT ADLT (ELECTROSURGICAL) ×3
ELECTRODE REM PT RTRN 9FT ADLT (ELECTROSURGICAL) ×1 IMPLANT
GLOVE BIO SURGEON STRL SZ7 (GLOVE) ×6 IMPLANT
GLOVE BIOGEL PI IND STRL 7.5 (GLOVE) ×1 IMPLANT
GLOVE BIOGEL PI INDICATOR 7.5 (GLOVE) ×2
GOWN STRL REUS W/ TWL LRG LVL3 (GOWN DISPOSABLE) ×2 IMPLANT
GOWN STRL REUS W/TWL LRG LVL3 (GOWN DISPOSABLE) ×4
ILLUMINATOR WAVEGUIDE N/F (MISCELLANEOUS) IMPLANT
KIT MARKER MARGIN INK (KITS) ×3 IMPLANT
LIGHT WAVEGUIDE WIDE FLAT (MISCELLANEOUS) IMPLANT
LIQUID BAND (GAUZE/BANDAGES/DRESSINGS) ×3 IMPLANT
NEEDLE HYPO 25X1 1.5 SAFETY (NEEDLE) ×3 IMPLANT
NS IRRIG 1000ML POUR BTL (IV SOLUTION) IMPLANT
PACK BASIN DAY SURGERY FS (CUSTOM PROCEDURE TRAY) ×3 IMPLANT
PENCIL BUTTON HOLSTER BLD 10FT (ELECTRODE) ×3 IMPLANT
SHEET MEDIUM DRAPE 40X70 STRL (DRAPES) IMPLANT
SLEEVE SCD COMPRESS KNEE MED (MISCELLANEOUS) ×3 IMPLANT
SPONGE GAUZE 4X4 12PLY STER LF (GAUZE/BANDAGES/DRESSINGS) IMPLANT
SPONGE LAP 4X18 X RAY DECT (DISPOSABLE) ×3 IMPLANT
STRIP CLOSURE SKIN 1/2X4 (GAUZE/BANDAGES/DRESSINGS) ×2 IMPLANT
SUT MNCRL AB 4-0 PS2 18 (SUTURE) IMPLANT
SUT MON AB 5-0 PS2 18 (SUTURE) IMPLANT
SUT SILK 2 0 SH (SUTURE) IMPLANT
SUT VIC AB 2-0 SH 27 (SUTURE) ×2
SUT VIC AB 2-0 SH 27XBRD (SUTURE) ×1 IMPLANT
SUT VIC AB 3-0 SH 27 (SUTURE) ×2
SUT VIC AB 3-0 SH 27X BRD (SUTURE) ×1 IMPLANT
SUT VIC AB 5-0 PS2 18 (SUTURE) IMPLANT
SYR CONTROL 10ML LL (SYRINGE) ×3 IMPLANT
TAPE STRIPS DRAPE STRL (GAUZE/BANDAGES/DRESSINGS) ×3 IMPLANT
TOWEL OR 17X24 6PK STRL BLUE (TOWEL DISPOSABLE) ×3 IMPLANT
TOWEL OR NON WOVEN STRL DISP B (DISPOSABLE) ×3 IMPLANT
TUBE CONNECTING 20'X1/4 (TUBING)
TUBE CONNECTING 20X1/4 (TUBING) IMPLANT
YANKAUER SUCT BULB TIP NO VENT (SUCTIONS) IMPLANT

## 2016-05-15 NOTE — Op Note (Signed)
Preoperative diagnoses: Right breast mass with discordant core biopsy Postoperative diagnosis: Same as above Procedure:Rightbreast seed guided excisional biopsy Surgeon: Dr. Serita Grammes Anesthesia: Gen. Estimated blood loss: Minimal Complications: None Drains: None Specimens:Right breast tissue marked with paint Sponge and needle count correct at completion Disposition to recovery stable  Indications: This is an 35yof who has a right sided mammographic abnormality. This is a vague area of distortion with discordant biopsy. We did do mri prior and there are a couple nodular areas. We discussed seed guided excision.She had seed placed prior to surgery. I had her mm in the OR.   Procedure:After informed consent was obtained she was then taken to the operating room. She was given cefazolin. Sequential compression devices on her legs. She was placed under general anesthesia without complication. Her rightbreast was then prepped and draped in the standard sterile surgical fashion. A surgical timeout was then performed.  I located the radioactive seed with the neoprobe.I infiltrated marcaine in the superior breast.  I made a superior periareolar incision.  I then used the neoprobe to guide the excision of the seed and surrounding tissue. . This was confirmed by the neoprobe. This was painted. This was then taken for mammogram which confirmed removal of the clip and the seed. This was confirmed by radiology. This was then sent to pathology. Hemostasis was observed.I closed the breast tissue with a 2-0 Vicryl. The dermis was closed with 3-0 Vicryl and the skin with 5-0 Monocryl.Dermabond and steristrips were placed on the incision. She was transferred to recovery stable

## 2016-05-15 NOTE — Anesthesia Procedure Notes (Signed)
Procedure Name: LMA Insertion Date/Time: 05/15/2016 10:09 AM Performed by: Baxter Flattery Pre-anesthesia Checklist: Patient identified, Emergency Drugs available, Suction available and Patient being monitored Patient Re-evaluated:Patient Re-evaluated prior to inductionOxygen Delivery Method: Circle system utilized Preoxygenation: Pre-oxygenation with 100% oxygen Intubation Type: IV induction Ventilation: Mask ventilation without difficulty LMA: LMA inserted LMA Size: 4.0 Number of attempts: 1 Airway Equipment and Method: Bite block Placement Confirmation: positive ETCO2 and breath sounds checked- equal and bilateral Tube secured with: Tape Dental Injury: Teeth and Oropharynx as per pre-operative assessment

## 2016-05-15 NOTE — Transfer of Care (Signed)
Immediate Anesthesia Transfer of Care Note  Patient: Kathryn Booker  Procedure(s) Performed: Procedure(s) with comments: RIGHT RADIOACTIVE SEED GUIDED EXCISIONAL BREAST BIOPSY (Right) - RIGHT RADIOACTIVE SEED GUIDED EXCISIONAL BREAST BIOPSY  Patient Location: PACU  Anesthesia Type:General  Level of Consciousness: awake, sedated and responds to stimulation  Airway & Oxygen Therapy: Patient Spontanous Breathing and Patient connected to face mask oxygen  Post-op Assessment: Report given to RN, Post -op Vital signs reviewed and stable and Patient moving all extremities  Post vital signs: Reviewed and stable  Last Vitals:  Vitals:   05/15/16 0827  BP: 128/73  Pulse: 88  Resp: 16  Temp: 37.1 C    Last Pain:  Vitals:   05/15/16 0827  TempSrc: Oral         Complications: No apparent anesthesia complications

## 2016-05-15 NOTE — Discharge Instructions (Signed)
[facility] °Office Phone Number [phone] ° °POST OP INSTRUCTIONS ° °Always review your discharge instruction sheet given to you by the facility where your surgery was performed. ° °IF YOU HAVE DISABILITY OR FAMILY LEAVE FORMS, YOU MUST BRING THEM TO THE OFFICE FOR PROCESSING.  DO NOT GIVE THEM TO YOUR DOCTOR. ° °1. A prescription for pain medication may be given to you upon discharge.  Take your pain medication as prescribed, if needed.  If narcotic pain medicine is not needed, then you may take acetaminophen (Tylenol), naprosyn (Alleve) or ibuprofen (Advil) as needed. °2. Take your usually prescribed medications unless otherwise directed °3. If you need a refill on your pain medication, please contact your pharmacy.  They will contact our office to request authorization.  Prescriptions will not be filled after 5pm or on week-ends. °4. You should eat very light the first 24 hours after surgery, such as soup, crackers, pudding, etc.  Resume your normal diet the day after surgery. °5. Most patients will experience some swelling and bruising in the breast.  Ice packs and a good support bra will help.  Wear the breast binder provided or a sports bra for 72 hours day and night.  After that wear a sports bra during the day until you return to the office. Swelling and bruising can take several days to resolve.  °6. It is common to experience some constipation if taking pain medication after surgery.  Increasing fluid intake and taking a stool softener will usually help or prevent this problem from occurring.  A mild laxative (Milk of Magnesia or Miralax) should be taken according to package directions if there are no bowel movements after 48 hours. °7. Unless discharge instructions indicate otherwise, you may remove your bandages 48 hours after surgery and you may shower at that time.  You may have steri-strips (small skin tapes) in place directly over the incision.  These strips should be left on the  skin for 7-10 days and will come off on their own.  If your surgeon used skin glue on the incision, you may shower in 24 hours.  The glue will flake off over the next 2-3 weeks.  Any sutures or staples will be removed at the office during your follow-up visit. °8. ACTIVITIES:  You may resume regular daily activities (gradually increasing) beginning the next day.  Wearing a good support bra or sports bra minimizes pain and swelling.  You may have sexual intercourse when it is comfortable. °a. You may drive when you no longer are taking prescription pain medication, you can comfortably wear a seatbelt, and you can safely maneuver your car and apply brakes. °b. RETURN TO WORK:  ______________________________________________________________________________________ °9. You should see your doctor in the office for a follow-up appointment approximately two weeks after your surgery.  Your doctor’s nurse will typically make your follow-up appointment when she calls you with your pathology report.  Expect your pathology report 3-4 business days after your surgery.  You may call to check if you do not hear from us after three days. °10. OTHER INSTRUCTIONS: _______________________________________________________________________________________________ _____________________________________________________________________________________________________________________________________ °_____________________________________________________________________________________________________________________________________ °_____________________________________________________________________________________________________________________________________ ° °WHEN TO CALL DR WAKEFIELD: °1. Fever over 101.0 °2. Nausea and/or vomiting. °3. Extreme swelling or bruising. °4. Continued bleeding from incision. °5. Increased pain, redness, or drainage from the incision. ° °The clinic staff is available to answer your questions during regular  business hours.  Please don’t hesitate to call and ask to speak to one of the nurses for clinical concerns.  If   you have a medical emergency, go to the nearest emergency room or call 911.  A surgeon from [facility] is always on call at the hospital. ° °For further questions, please visit [email or web address] ° ° ° °Post Anesthesia Home Care Instructions ° °Activity: °Get plenty of rest for the remainder of the day. A responsible adult should stay with you for 24 hours following the procedure.  °For the next 24 hours, DO NOT: °-Drive a car °-Operate machinery °-Drink alcoholic beverages °-Take any medication unless instructed by your physician °-Make any legal decisions or sign important papers. ° °Meals: °Start with liquid foods such as gelatin or soup. Progress to regular foods as tolerated. Avoid greasy, spicy, heavy foods. If nausea and/or vomiting occur, drink only clear liquids until the nausea and/or vomiting subsides. Call your physician if vomiting continues. ° °Special Instructions/Symptoms: °Your throat may feel dry or sore from the anesthesia or the breathing tube placed in your throat during surgery. If this causes discomfort, gargle with warm salt water. The discomfort should disappear within 24 hours. ° °If you had a scopolamine patch placed behind your ear for the management of post- operative nausea and/or vomiting: ° °1. The medication in the patch is effective for 72 hours, after which it should be removed.  Wrap patch in a tissue and discard in the trash. Wash hands thoroughly with soap and water. °2. You may remove the patch earlier than 72 hours if you experience unpleasant side effects which may include dry mouth, dizziness or visual disturbances. °3. Avoid touching the patch. Wash your hands with soap and water after contact with the patch. °  ° °

## 2016-05-15 NOTE — H&P (Signed)
65 yof who has no prior breast history presents after undergoing screening mm. this shows c density breasts. this is consult from dr Dorise Bullion. the right breast has a distortion. on further testing there is a 5-6 cm area of confluent distortion on mm. US shows no correlate. stereo biopsy was performed that shows fibrocystic changes. she has no complaints referable to either breast. she has no family history   Other Problems  Asthma Back Pain Chronic Obstructive Lung Disease Depression Gastroesophageal Reflux Disease Hypercholesterolemia  Past Surgical History  Appendectomy Breast Biopsy Right. Tonsillectomy  Diagnostic Studies History Colonoscopy 5-10 years ago Mammogram within last year Pap Smear 1-5 years ago  Allergies  Statins  Medication History  Advair Diskus (250-50MCG/DOSE Aero Pow Br Act, Inhalation) Active. BuPROPion HCl ER (XL) (300MG  Tablet ER 24HR, Oral) Active. Citalopram Hydrobromide (40MG  Tablet, Oral) Active. ValACYclovir HCl (500MG  Tablet, Oral) Active. Nystatin (100000 UNIT/GM Cream, External) Active. Ezetimibe (10MG  Tablet, Oral) Active. Vitamin D (Cholecalciferol) (1000UNIT Capsule, 5000 mg Oral) Active. Magnesium (250MG  Tablet, Oral) Active. Fish Oil (1000MG  Capsule, Oral) Active. ProAir HFA (108 (90 Base)MCG/ACT Aerosol Soln, Inhalation) Active. Medications Reconciled  Social History  Alcohol use Moderate alcohol use. Caffeine use Carbonated beverages, Coffee. No drug use Tobacco use Former smoker.  Family History  Alcohol Abuse Father. Arthritis Mother. Diabetes Mellitus Family Members In General. Heart Disease Mother. Heart disease in female family member before age 68 Hypertension Mother.  Pregnancy / Birth History Age at menarche 11 years. Age of menopause 73-50 Contraceptive History Oral contraceptives. Gravida 1 Maternal age 64-40 Para 0  Review of Systems  General Not  Present- Appetite Loss, Chills, Fatigue, Fever, Night Sweats, Weight Gain and Weight Loss. Skin Not Present- Change in Wart/Mole, Dryness, Hives, Jaundice, New Lesions, Non-Healing Wounds, Rash and Ulcer. HEENT Present- Hearing Loss, Seasonal Allergies, Visual Disturbances and Wears glasses/contact lenses. Not Present- Earache, Hoarseness, Nose Bleed, Oral Ulcers, Ringing in the Ears, Sinus Pain, Sore Throat and Yellow Eyes. Respiratory Present- Chronic Cough and Snoring. Not Present- Bloody sputum, Difficulty Breathing and Wheezing. Breast Not Present- Breast Mass, Breast Pain, Nipple Discharge and Skin Changes. Cardiovascular Present- Palpitations. Not Present- Chest Pain, Difficulty Breathing Lying Down, Leg Cramps, Rapid Heart Rate, Shortness of Breath and Swelling of Extremities. Gastrointestinal Present- Indigestion. Not Present- Abdominal Pain, Bloating, Bloody Stool, Change in Bowel Habits, Chronic diarrhea, Constipation, Difficulty Swallowing, Excessive gas, Gets full quickly at meals, Hemorrhoids, Nausea, Rectal Pain and Vomiting. Female Genitourinary Present- Frequency. Not Present- Nocturia, Painful Urination, Pelvic Pain and Urgency. Musculoskeletal Present- Back Pain. Not Present- Joint Pain, Joint Stiffness, Muscle Pain, Muscle Weakness and Swelling of Extremities. Neurological Present- Decreased Memory. Not Present- Fainting, Headaches, Numbness, Seizures, Tingling, Tremor, Trouble walking and Weakness. Psychiatric Not Present- Anxiety, Bipolar, Change in Sleep Pattern, Depression, Fearful and Frequent crying. Endocrine Present- Heat Intolerance and Hot flashes. Not Present- Cold Intolerance, Excessive Hunger, Hair Changes and New Diabetes. Hematology Not Present- Easy Bruising, Excessive bleeding, Gland problems, HIV and Persistent Infections.  Vitals  Weight: 151 lb Height: 63in Height was reported by patient. Body Surface Area: 1.72 m Body Mass Index: 26.75 kg/m  Temp.:  59F  Pulse: 70 (Regular)  BP: 132/88 (Sitting, Left Arm, Standard) Physical Exam General Mental Status-Alert. Orientation-Oriented X3. Eye Sclera/Conjunctiva - Bilateral-No scleral icterus. Chest and Lung Exam Chest and lung exam reveals -quiet, even and easy respiratory effort with no use of accessory muscles and on auscultation, normal breath sounds, no adventitious sounds and normal vocal resonance. Breast Nipples-No Discharge. Breast  Lump-No Palpable Breast Mass. Cardiovascular Cardiovascular examination reveals -normal heart sounds, regular rate and rhythm with no murmurs. Lymphatic Head & Neck General Head & Neck Lymphatics: Bilateral - Description - Normal. Axillary General Axillary Region: Bilateral - Description - Normal. Note: no [employer] adenopathy   A/p Right breast seed guided excisional biopsy

## 2016-05-15 NOTE — Anesthesia Preprocedure Evaluation (Signed)
Anesthesia Evaluation  Patient identified by MRN, date of birth, ID band Patient awake    Reviewed: Allergy & Precautions, NPO status , Patient's Chart, lab work & pertinent test results  Airway Mallampati: I  TM Distance: >3 FB Neck ROM: Full    Dental  (+) Teeth Intact, Dental Advisory Given   Pulmonary asthma , COPD,  COPD inhaler, former smoker,    breath sounds clear to auscultation       Cardiovascular  Rhythm:Regular Rate:Normal     Neuro/Psych    GI/Hepatic   Endo/Other    Renal/GU      Musculoskeletal   Abdominal   Peds  Hematology   Anesthesia Other Findings   Reproductive/Obstetrics                             Anesthesia Physical Anesthesia Plan  ASA: II  Anesthesia Plan: General   Post-op Pain Management:    Induction: Intravenous  Airway Management Planned: LMA  Additional Equipment:   Intra-op Plan:   Post-operative Plan: Extubation in OR  Informed Consent: I have reviewed the patients History and Physical, chart, labs and discussed the procedure including the risks, benefits and alternatives for the proposed anesthesia with the patient or authorized representative who has indicated his/her understanding and acceptance.   Dental advisory given  Plan Discussed with: Anesthesiologist, CRNA and Surgeon  Anesthesia Plan Comments:         Anesthesia Quick Evaluation

## 2016-05-15 NOTE — Anesthesia Postprocedure Evaluation (Signed)
Anesthesia Post Note  Patient: Parminder S Garza  Procedure(s) Performed: Procedure(s) (LRB): RIGHT RADIOACTIVE SEED GUIDED EXCISIONAL BREAST BIOPSY (Right)  Patient location during evaluation: PACU Anesthesia Type: General Level of consciousness: awake and alert Pain management: pain level controlled Vital Signs Assessment: post-procedure vital signs reviewed and stable Respiratory status: spontaneous breathing, nonlabored ventilation and respiratory function stable Cardiovascular status: blood pressure returned to baseline and stable Postop Assessment: no signs of nausea or vomiting Anesthetic complications: no    Last Vitals:  Vitals:   05/15/16 1100 05/15/16 1115  BP: 116/71 113/68  Pulse: 80 80  Resp: 18 (!) 21  Temp:      Last Pain:  Vitals:   05/15/16 1115  TempSrc:   PainSc: 3                  Bomani Oommen A

## 2016-05-15 NOTE — Interval H&P Note (Signed)
History and Physical Interval Note:  05/15/2016 9:41 AM  Kathryn Booker  has presented today for surgery, with the diagnosis of right breast mass  The various methods of treatment have been discussed with the patient and family. After consideration of risks, benefits and other options for treatment, the patient has consented to  Procedure(s): RIGHT RADIOACTIVE SEED GUIDED EXCISIONAL BREAST BIOPSY (Right) as a surgical intervention .  The patient's history has been reviewed, patient examined, no change in status, stable for surgery.  I have reviewed the patient's chart and labs.  Questions were answered to the patient's satisfaction.     [name]

## 2016-05-16 ENCOUNTER — Encounter (HOSPITAL_BASED_OUTPATIENT_CLINIC_OR_DEPARTMENT_OTHER): Payer: Self-pay | Admitting: General Surgery

## 2016-05-23 ENCOUNTER — Other Ambulatory Visit: Payer: Self-pay | Admitting: Physician Assistant

## 2016-06-17 ENCOUNTER — Other Ambulatory Visit: Payer: Self-pay | Admitting: Internal Medicine

## 2016-06-17 DIAGNOSIS — J4520 Mild intermittent asthma, uncomplicated: Secondary | ICD-10-CM

## 2016-07-29 ENCOUNTER — Other Ambulatory Visit: Payer: Self-pay | Admitting: Internal Medicine

## 2016-08-07 ENCOUNTER — Ambulatory Visit (INDEPENDENT_AMBULATORY_CARE_PROVIDER_SITE_OTHER): Payer: BC Managed Care – PPO

## 2016-08-07 ENCOUNTER — Ambulatory Visit (INDEPENDENT_AMBULATORY_CARE_PROVIDER_SITE_OTHER): Payer: BC Managed Care – PPO | Admitting: Physician Assistant

## 2016-08-07 VITALS — BP 122/70 | HR 64 | Temp 98.4°F | Resp 16 | Ht 63.0 in | Wt 152.4 lb

## 2016-08-07 DIAGNOSIS — J441 Chronic obstructive pulmonary disease with (acute) exacerbation: Secondary | ICD-10-CM | POA: Diagnosis not present

## 2016-08-07 DIAGNOSIS — R062 Wheezing: Secondary | ICD-10-CM | POA: Diagnosis not present

## 2016-08-07 DIAGNOSIS — R109 Unspecified abdominal pain: Secondary | ICD-10-CM | POA: Diagnosis not present

## 2016-08-07 DIAGNOSIS — Z87891 Personal history of nicotine dependence: Secondary | ICD-10-CM

## 2016-08-07 DIAGNOSIS — Z87898 Personal history of other specified conditions: Secondary | ICD-10-CM | POA: Diagnosis not present

## 2016-08-07 LAB — POCT CBC
GRANULOCYTE PERCENT: 63 % (ref 37–80)
HEMATOCRIT: 40.2 % (ref 37.7–47.9)
HEMOGLOBIN: 14.4 g/dL (ref 12.2–16.2)
Lymph, poc: 2.3 (ref 0.6–3.4)
MCH: 33.6 pg — AB (ref 27–31.2)
MCHC: 35.8 g/dL — AB (ref 31.8–35.4)
MCV: 93.7 fL (ref 80–97)
MID (cbc): 0.5 (ref 0–0.9)
MPV: 6.9 fL (ref 0–99.8)
POC GRANULOCYTE: 4.8 (ref 2–6.9)
POC LYMPH PERCENT: 30.6 %L (ref 10–50)
POC MID %: 6.4 % (ref 0–12)
Platelet Count, POC: 240 10*3/uL (ref 142–424)
RBC: 4.28 M/uL (ref 4.04–5.48)
RDW, POC: 13 %
WBC: 7.6 10*3/uL (ref 4.6–10.2)

## 2016-08-07 LAB — POCT URINALYSIS DIP (MANUAL ENTRY)
BILIRUBIN UA: NEGATIVE
BILIRUBIN UA: NEGATIVE
Glucose, UA: NEGATIVE
Leukocytes, UA: NEGATIVE
NITRITE UA: NEGATIVE
PH UA: 5.5
PROTEIN UA: NEGATIVE
RBC UA: NEGATIVE
Spec Grav, UA: 1.01
Urobilinogen, UA: 0.2

## 2016-08-07 LAB — POC MICROSCOPIC URINALYSIS (UMFC): Mucus: ABSENT

## 2016-08-07 MED ORDER — ALBUTEROL SULFATE (2.5 MG/3ML) 0.083% IN NEBU
2.5000 mg | INHALATION_SOLUTION | Freq: Once | RESPIRATORY_TRACT | Status: AC
  Administered 2016-08-07: 2.5 mg via RESPIRATORY_TRACT

## 2016-08-07 MED ORDER — IPRATROPIUM BROMIDE 0.02 % IN SOLN
0.5000 mg | Freq: Once | RESPIRATORY_TRACT | Status: AC
  Administered 2016-08-07: 0.5 mg via RESPIRATORY_TRACT

## 2016-08-07 MED ORDER — PREDNISONE 20 MG PO TABS: 40.0000 mg | ORAL_TABLET | Freq: Every day | ORAL | 0 refills | Status: AC

## 2016-08-07 MED ORDER — DOXYCYCLINE HYCLATE 100 MG PO CAPS: 100.0000 mg | ORAL_CAPSULE | Freq: Two times a day (BID) | ORAL | 0 refills | Status: AC

## 2016-08-07 NOTE — Progress Notes (Signed)
[name]  MRN: [id number] DOB: 09/07/1951  Subjective:  Kathryn Booker is a 65 y.o. female seen in office today for a chief complaint of left sided thoracic back pain x 1 month. Notes it has gotten progressively worse over the past few days and the pain is reproducible and made worse with certain movements. Of note, pt has history of chronic low back pain that is bilateral and is constantly having to switch chairs/pillows in her chairs at work to avoid flare ups. Denies saddle anesthesia, bladder/bowel incontinence, numbness, tingling, and hematuria. For low back pain she will intermittently take flexeril and ibuprofen but does not like to take medicine.   Pt also notes that her cough has gotten worse over the past few days. Has associated subjective fever, chills, wheezing, and fatigue. Denies myalgia, sore throat, eye pain, chest pain, pleuritic pain, and ear pain.    Of note, she has history of asthma since she was a child and also has a history of COPD, which is managed with inhalers. She uses advair diskus BID and albuterol prn. She states she has been coughing for years. She is followed by PCP Melford Aase but notes she has not seen a pulmonologist in over 10 years for management of her pulmonary conditions. Notes she just deals with her chronic cough. Pt is a former smoker. She smoked two packs per day for 35 years. She quit 2.5 years ago.  For this specific episode of worsening cough, pt has tried her rescue inhaler intermittently with minimal relief.   Pt also notes she has been having palpitations intermittently over the past four weeks for anywhere from 5-30 minutes at a time. She has not had one it about a week. During these episodes, she would have associated lightheadedness. Denies nausea, vomiting, diaphoresis, and chest pain during these episodes.  Her last EKG was 05/02/16. She has not seen a cardiologist in 2 years but was followed due to her RBBB.   Review of Systems    Genitourinary: Negative for difficulty urinating, dysuria, flank pain, hematuria and urgency.    Patient Active Problem List   Diagnosis Date Noted  . Osteopenia 05/02/2016  . Prediabetes 05/02/2015  . Vitamin D deficiency 02/14/2014  . Anxiety   . Hyperlipidemia   . Hypothyroidism   . Depression   . Asthma   . COPD (chronic obstructive pulmonary disease) ([place])   . At risk for coronary artery disease 01/30/2012  . 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 ADVAIR DISKUS 250-50 MCG/DOSE AEPB INHALE 1 INHALATION BY MOUTH 2 TIMES DAILY, RINSE MOUTH AFTER INHALATION 180 each 2  . albuterol (PROAIR HFA) 108 (90 BASE) MCG/ACT inhaler Inhale 2 puffs into the lungs every 6 (six) hours as needed for wheezing or shortness of breath. 18 g 3  . ALPRAZolam (XANAX) 0.5 MG tablet Take 1 tablet (0.5 mg total) by mouth 2 (two) times daily as needed for anxiety. 60 tablet 0  . buPROPion (WELLBUTRIN XL) 300 MG 24 hr tablet TAKE 1 TABLET BY MOUTH EVERY DAY 90 tablet 1  . Cholecalciferol (VITAMIN D PO) Take 1,000 Units by mouth.    . citalopram (CELEXA) 40 MG tablet TAKE 1 TABLET (40 MG TOTAL) BY MOUTH DAILY. 90 tablet 0  . cyclobenzaprine (FLEXERIL) 10 MG tablet Take 1 tablet (10 mg total) by mouth at bedtime. 90 tablet 1  .  ezetimibe (ZETIA) 10 MG tablet TAKE 1 TABLET (10 MG TOTAL) BY MOUTH DAILY. 90 tablet 1  . guaiFENesin (MUCINEX) 600 MG 12 hr tablet Take by mouth 2 (two) times daily.    . Magnesium 250 MG TABS Take by mouth daily.    . Multiple Vitamins-Minerals (MULTIVITAMIN WITH MINERALS) tablet Take 1 tablet by mouth daily.    . Omega-3 Fatty Acids (FISH OIL) 1000 MG CAPS Take by mouth daily.    [name] OVER THE COUNTER MEDICATION Eye drop 1 drop each eye daily.    [name] triamcinolone (NASACORT ALLERGY 24HR) 55 MCG/ACT AERO nasal inhaler Place 2 sprays into the nose daily.     . valACYclovir (VALTREX) 500 MG tablet TAKE 1 TABLET (500 MG TOTAL) BY MOUTH DAILY. 90 tablet 1  . HYDROcodone-acetaminophen (NORCO) 10-325 MG tablet Take 1 tablet by mouth every 6 (six) hours as needed. (Patient not taking: Reported on 08/07/2016) 10 tablet 0  . ondansetron (ZOFRAN ODT) 8 MG disintegrating tablet Take 1 tablet (8 mg total) by mouth every 8 (eight) hours as needed for nausea or vomiting. (Patient not taking: Reported on 08/07/2016) 20 tablet 0  . ranitidine (ZANTAC) 150 MG capsule Take 150 mg by mouth 2 (two) times daily.     No current facility-administered medications on file prior to visit.     Allergies  Allergen Reactions  . Lipitor [Atorvastatin]     Myalgias   . Tetanus Toxoids Itching   Past Medical History:  Diagnosis Date  . Anxiety   . Asthma   . COPD (chronic obstructive pulmonary disease) ([place])   . Depression   . Elevated cholesterol   . Fatigue   . RBBB (right bundle branch block with left anterior fascicular block)   . Scoliosis   . Serum calcium elevated   . Thyroid disease      Objective:  BP 122/70 (BP Location: Right Arm, Patient Position: Sitting, Cuff Size: Normal)   Pulse 64   Temp 98.4 F (36.9 C) (Oral)   Resp 16   Ht 5\' 3"  (1.6 m)   Wt 152 lb 6.4 oz (69.1 kg)   SpO2 98%   PF 325 L/min Comment: Before breathing tx  BMI 27.00 kg/m   Physical Exam  Constitutional: She is oriented to person, place, and time.  Well developed, well nourished, in mild distress.   HENT:  Head: Normocephalic and atraumatic.  Right Ear: Tympanic membrane, external ear and ear canal normal.  Left Ear: Tympanic membrane, external ear and ear canal normal.  Nose: Mucosal edema present. Right sinus exhibits no maxillary sinus tenderness and no frontal sinus tenderness. Left sinus exhibits no maxillary sinus tenderness and no frontal sinus tenderness.  Mouth/Throat: Uvula is midline and mucous membranes are normal. Posterior oropharyngeal erythema present.   Pulmonary/Chest: Effort normal. She has wheezes ( intermittent, more pronounced with forced expiratory breathing). She has rhonchi ( cleared with coughing). She has no rales.  Course breath sound heard throughout.  Abdominal: There is no CVA tenderness.  Musculoskeletal:       Thoracic back: She exhibits tenderness ( upon right sided musculature). She exhibits no bony tenderness.       Lumbar back: She exhibits tenderness (upon palpation of musculature bilaterally). She exhibits no bony tenderness.  Lymphadenopathy:       Head (right side): No submental, no submandibular, no tonsillar, no preauricular, no posterior auricular and no occipital adenopathy present.       Head (left side): No submental, no  submandibular, no tonsillar, no preauricular, no posterior auricular and no occipital adenopathy present.    She has no cervical adenopathy.       Right: No supraclavicular adenopathy present.       Left: No supraclavicular adenopathy present.  Neurological: She is alert and oriented to person, place, and time. Gait normal.  Skin: Skin is warm and dry.  Psychiatric: Affect normal.    Results for orders placed or performed in visit on 08/07/16 (from the past 24 hour(s))  POCT urinalysis dipstick     Status: None   Collection Time: 08/07/16  2:29 PM  Result Value Ref Range   Color, UA yellow yellow   Clarity, UA clear clear   Glucose, UA negative negative   Bilirubin, UA negative negative   Ketones, POC UA negative negative   Spec Grav, UA 1.010    Blood, UA negative negative   pH, UA 5.5    Protein Ur, POC negative negative   Urobilinogen, UA 0.2    Nitrite, UA Negative Negative   Leukocytes, UA Negative Negative  POCT Microscopic Urinalysis ([facility])     Status: Abnormal   Collection Time: 08/07/16  2:29 PM  Result Value Ref Range   WBC,UR,HPF,POC None None WBC/hpf   RBC,UR,HPF,POC None None RBC/hpf   Bacteria None None, Too numerous to count   Mucus Absent Absent   Epithelial  Cells, UR Per Microscopy Few (A) None, Too numerous to count cells/hpf  POCT CBC     Status: Abnormal   Collection Time: 08/07/16  2:52 PM  Result Value Ref Range   WBC 7.6 4.6 - 10.2 K/uL   Lymph, poc 2.3 0.6 - 3.4   POC LYMPH PERCENT 30.6 10 - 50 %L   MID (cbc) 0.5 0 - 0.9   POC MID % 6.4 0 - 12 %M   POC Granulocyte 4.8 2 - 6.9   Granulocyte percent 63.0 37 - 80 %G   RBC 4.28 4.04 - 5.48 M/uL   Hemoglobin 14.4 12.2 - 16.2 g/dL   HCT, POC 40.2 37.7 - 47.9 %   MCV 93.7 80 - 97 fL   MCH, POC 33.6 (A) 27 - 31.2 pg   MCHC 35.8 (A) 31.8 - 35.4 g/dL   RDW, POC 13.0 %   Platelet Count, POC 240 142 - 424 K/uL   MPV 6.9 0 - 99.8 fL   Dg Chest 2 View  Result Date: 08/07/2016 CLINICAL DATA:  Constantly coughing, worse over the past few days. Chills, fatigue and subjective fever. EXAM: CHEST  2 VIEW COMPARISON:  07/17/2015. FINDINGS: Normal sized heart. Clear lungs. Diffuse peribronchial thickening. Mild thoracic spine degenerative changes. IMPRESSION: No acute abnormality.  Stable chronic bronchitic changes. Electronically Signed   By: Claudie Revering M.D.   On: 08/07/2016 14:33   EKG shows sinus rhythm at 62 bpm with Right bundle branch block, consistent on prior EKG in 04/2016. There are no acute ST or T wave changes. EKG findings presented to Dr. Mingo Amber.   Post breathing treatment pt notes she feels better. Wheezing improved on lung ausculation. No rhonchi auscultated. Sp02 98%.  Peak flow prior to breathing treatment 325, post treatment 350  Assessment and Plan :  1. Back pain - POCT urinalysis dipstick - POCT Microscopic Urinalysis ([facility]) - POCT CBC  2. History of palpitations - EKG 12-Lead - Ambulatory referral to Cardiology  3. History of smoking 30 or more pack years - CT CHEST LUNG CA SCREEN LOW DOSE W/O CM;  Future - Ambulatory referral to Pulmonology  4. Wheezing - albuterol (PROVENTIL) (2.5 MG/3ML) 0.083% nebulizer solution 2.5 mg; Take 3 mLs (2.5 mg total) by nebulization  once. - ipratropium (ATROVENT) nebulizer solution 0.5 mg; Take 2.5 mLs (0.5 mg total) by nebulization once. - Spirometry: Peak  5. COPD exacerbation ([place]) - DG Chest 2 View; Future - albuterol (PROVENTIL) (2.5 MG/3ML) 0.083% nebulizer solution 2.5 mg; Take 3 mLs (2.5 mg total) by nebulization once. - ipratropium (ATROVENT) nebulizer solution 0.5 mg; Take 2.5 mLs (0.5 mg total) by nebulization once. - predniSONE (DELTASONE) 20 MG tablet; Take 2 tablets (40 mg total) by mouth daily with breakfast.  Dispense: 10 tablet; Refill: 0 - doxycycline (VIBRAMYCIN) 100 MG capsule; Take 1 capsule (100 mg total) by mouth 2 (two) times daily.  Dispense: 14 capsule; Refill: 0 -Return to clinic if symptoms worsen, do not improve, or as needed  Tenna Delaine PA-C  [facility] 08/07/2016 3:09 PM

## 2016-08-07 NOTE — Patient Instructions (Addendum)
Use albuterol inhaler q 6 hours for the next 24 hours. After this, use albuterol as needed for wheezing.   Complete prednisone and antibiotic course.  Continue using advair diskus and flonase daily.   You will be contacted by both pulmonology and cardiology for your appointment. If you call your cardiologist and make an appointment with them before we do, let us know.    For your back pain, the prednisone should help. After this you can use your flexeril as needed for spasms.   -Return to clinic if symptoms worsen, do not improve, or as needed   IF you received an x-ray today, you will receive an invoice from [facility]. Please contact [facility] at [phone] with questions or concerns regarding your invoice.   IF you received labwork today, you will receive an invoice from [employer]. Please contact [facility] at [phone] with questions or concerns regarding your invoice.   Our billing staff will not be able to assist you with questions regarding bills from these companies.  You will be contacted with the lab results as soon as they are available. The fastest way to get your results is to activate your My Chart account. Instructions are located on the last page of this paperwork. If you have not heard from [place] regarding the results in 2 weeks, please contact this office.

## 2016-08-11 ENCOUNTER — Encounter: Payer: Self-pay | Admitting: Physician Assistant

## 2016-08-13 NOTE — Telephone Encounter (Signed)
Referrals, please see request from pt to see Dr Johnsie Cancel (former pt).

## 2016-08-20 ENCOUNTER — Ambulatory Visit: Payer: BC Managed Care – PPO

## 2016-09-03 NOTE — Progress Notes (Signed)
Cardiology Office Note   Date:  09/04/2016   ID:  TEUILA MARSICANO, DOB 07/28/1951, MRN [id number]  PCP:  Alesia Richards, MD  Cardiologist:   Jenkins Rouge, MD   Chief Complaint  Patient presents with  . Establish Care      History of Present Illness: Kathryn Booker is a 65 y.o. female who presents for evaluation of palpitations Seen by primary 08/07/16 with multiple complaints including back pain, cough , history of asthma/COPD which she uses albuterol and advair for. Palpitations past month intermittently can last as long as 30 minutes Associated lightheadedness History of chronic RBBB Last seen by me in 2013 for benign RBBB devoloped in 2012.  Echo 2012 normal with EF 60-65% just MAC   CRF;s family history and elevated lipids. Intolerant to statins and was on zetia at that time   She has no chest pain Always feels like phlegm is stuck in her throat. Smoked 2 ppd up until 2.5 years ago. Works at [employer] position with no exposures  Past Medical History:  Diagnosis Date  . Anxiety   . Asthma   . COPD (chronic obstructive pulmonary disease) ([place])   . Depression   . Elevated cholesterol   . Fatigue   . RBBB (right bundle branch block with left anterior fascicular block)   . Scoliosis   . Serum calcium elevated   . Thyroid disease     Past Surgical History:  Procedure Laterality Date  . APPENDECTOMY    . LAPROSCOPIC     X 2 IN EARLY 30-40'S  . OVARIAN CYST REMOVAL    . RADIOACTIVE SEED GUIDED EXCISIONAL BREAST BIOPSY Right 05/15/2016   Procedure: RIGHT RADIOACTIVE SEED GUIDED EXCISIONAL BREAST BIOPSY;  Surgeon: Rolm Bookbinder, MD;  Location: [facility];  Service: General;  Laterality: Right;  RIGHT RADIOACTIVE SEED GUIDED EXCISIONAL BREAST BIOPSY  . TONSILECTOMY, ADENOIDECTOMY, BILATERAL MYRINGOTOMY AND TUBES       Current Outpatient Prescriptions  Medication Sig Dispense Refill  . ADVAIR DISKUS 250-50 MCG/DOSE AEPB INHALE 1  INHALATION BY MOUTH 2 TIMES DAILY, RINSE MOUTH AFTER INHALATION 180 each 2  . albuterol (PROAIR HFA) 108 (90 BASE) MCG/ACT inhaler Inhale 2 puffs into the lungs every 6 (six) hours as needed for wheezing or shortness of breath. 18 g 3  . ALPRAZolam (XANAX) 0.5 MG tablet Take 1 tablet (0.5 mg total) by mouth 2 (two) times daily as needed for anxiety. 60 tablet 0  . buPROPion (WELLBUTRIN XL) 300 MG 24 hr tablet TAKE 1 TABLET BY MOUTH EVERY DAY 90 tablet 1  . Cholecalciferol (VITAMIN D PO) Take 1,000 Units by mouth.    . citalopram (CELEXA) 40 MG tablet TAKE 1 TABLET (40 MG TOTAL) BY MOUTH DAILY. 90 tablet 0  . cyclobenzaprine (FLEXERIL) 10 MG tablet Take 1 tablet (10 mg total) by mouth at bedtime. 90 tablet 1  . ezetimibe (ZETIA) 10 MG tablet TAKE 1 TABLET (10 MG TOTAL) BY MOUTH DAILY. 90 tablet 1  . guaiFENesin (MUCINEX) 600 MG 12 hr tablet Take by mouth 2 (two) times daily.    . Magnesium 250 MG TABS Take by mouth daily.    . Multiple Vitamins-Minerals (MULTIVITAMIN WITH MINERALS) tablet Take 1 tablet by mouth daily.    . Omega-3 Fatty Acids (FISH OIL) 1000 MG CAPS Take by mouth daily.    [name] OVER THE COUNTER MEDICATION Eye drop 1 drop each eye daily.    . ranitidine (ZANTAC) 150 MG capsule  Take 150 mg by mouth 2 (two) times daily.    [name] triamcinolone (NASACORT ALLERGY 24HR) 55 MCG/ACT AERO nasal inhaler Place 2 sprays into the nose daily.    . valACYclovir (VALTREX) 500 MG tablet TAKE 1 TABLET (500 MG TOTAL) BY MOUTH DAILY. 90 tablet 1   No current facility-administered medications for this visit.     Allergies:   Lipitor [atorvastatin] and Tetanus toxoids    Social History:  The patient  reports that she quit smoking about 2 years ago. Her smoking use included Cigarettes. She has a 70.00 pack-year smoking history. She has never used smokeless tobacco. She reports that she drinks alcohol. She reports that she does not use drugs.   Family History:  The patient's family history includes Alcohol  abuse in her father; Heart disease in her mother; Heart failure in her mother; Hyperlipidemia in her mother; Hypertension in her mother; Liver disease in her father.    ROS:  Please see the history of present illness.   Otherwise, review of systems are positive for none.   All other systems are reviewed and negative.    PHYSICAL EXAM: VS:  There were no vitals taken for this visit. , BMI There is no height or weight on file to calculate BMI. Affect appropriate Healthy:  appears stated age 35: normal Neck supple with no adenopathy JVP normal no bruits no thyromegaly Lungs clear with no wheezing and good diaphragmatic motion Heart:  S1/S2 no murmur, no rub, gallop or click PMI normal Abdomen: benighn, BS positve, no tenderness, no AAA no bruit.  No HSM or HJR Distal pulses intact with no bruits No edema Neuro non-focal Skin warm and dry No muscular weakness    EKG:  08/07/16 SR LAFB/RBBB bifascicular block    Recent Labs: 05/02/2016: ALT 33; BUN 13; Creat 0.87; Magnesium 1.8; Platelets 240; Potassium 4.6; Sodium 137; TSH 1.97 08/07/2016: Hemoglobin 14.4    Lipid Panel    Component Value Date/Time   CHOL 216 (H) 05/02/2016 1052   TRIG 201 (H) 05/02/2016 1052   HDL 55 05/02/2016 1052   CHOLHDL 3.9 05/02/2016 1052   VLDL 40 (H) 05/02/2016 1052   LDLCALC 121 05/02/2016 1052      Wt Readings from Last 3 Encounters:  08/07/16 69.1 kg (152 lb 6.4 oz)  05/15/16 67.1 kg (148 lb)  05/02/16 67.7 kg (149 lb 3.2 oz)      Other studies Reviewed: Additional studies/ records that were reviewed today include: Notes 2012 with echo Notes from primary and ECG;s .    ASSESSMENT AND PLAN:  1.  Palpitations benign but frequent so will get event monitor and echo to r/o structural heart dx May have some MAT given asthma and COPD Most likely atrial arrhythmia not ventricular 2. Bifascicular Block chronic yearly ECG no evidence of high grade heart block 3. COPD/Asthma seeing Dr  [name] soon needs meds adjusted to help with cough  4. Cholesterol  Cholesterol is at goal.  Continue current dose of statin and diet Rx.  No myalgias or side effects.  F/U  LFT's in 6 months. Lab Results  Component Value Date   [place] 121 05/02/2016               Current medicines are reviewed at length with the patient today.  The patient does not have concerns regarding medicines.  The following changes have been made:  no change  Labs/ tests ordered today include: Event monitor and echo  No orders of  the defined types were placed in this encounter.    Disposition:   FU with next available      Signed, Jenkins Rouge, MD  09/04/2016 3:25 PM    [facility] [address], [place], [place]  [address] Phone: [phone]; Fax: [phone]

## 2016-09-04 ENCOUNTER — Ambulatory Visit (INDEPENDENT_AMBULATORY_CARE_PROVIDER_SITE_OTHER): Payer: BC Managed Care – PPO

## 2016-09-04 ENCOUNTER — Encounter: Payer: Self-pay | Admitting: Cardiovascular Disease

## 2016-09-04 ENCOUNTER — Ambulatory Visit (INDEPENDENT_AMBULATORY_CARE_PROVIDER_SITE_OTHER): Payer: BC Managed Care – PPO | Admitting: Cardiovascular Disease

## 2016-09-04 VITALS — BP 120/70 | HR 78 | Ht 63.0 in | Wt 153.4 lb

## 2016-09-04 DIAGNOSIS — R002 Palpitations: Secondary | ICD-10-CM

## 2016-09-04 DIAGNOSIS — Z7689 Persons encountering health services in other specified circumstances: Secondary | ICD-10-CM

## 2016-09-04 NOTE — Patient Instructions (Addendum)
Medication Instructions:  Your physician recommends that you continue on your current medications as directed. Please refer to the Current Medication list given to you today.  Labwork: NONE  Testing/Procedures: Your physician has requested that you have an echocardiogram. Echocardiography is a painless test that uses sound waves to create images of your heart. It provides your doctor with information about the size and shape of your heart and how well your heart's chambers and valves are working. This procedure takes approximately one hour. There are no restrictions for this procedure.  Your physician has recommended that you wear an 21 day event monitor. Event monitors are medical devices that record the heart's electrical activity. Doctors most often [place] these monitors to diagnose arrhythmias. Arrhythmias are problems with the speed or rhythm of the heartbeat. The monitor is a small, portable device. You can wear one while you do your normal daily activities. This is usually used to diagnose what is causing palpitations/syncope (passing out).  Follow-Up: Your physician wants you to follow-up next available with Dr. Johnsie Cancel.    If you need a refill on your cardiac medications before your next appointment, please call your pharmacy.

## 2016-09-07 ENCOUNTER — Other Ambulatory Visit: Payer: Self-pay | Admitting: Internal Medicine

## 2016-09-10 ENCOUNTER — Ambulatory Visit (INDEPENDENT_AMBULATORY_CARE_PROVIDER_SITE_OTHER): Payer: BC Managed Care – PPO | Admitting: Pulmonary Disease

## 2016-09-10 ENCOUNTER — Encounter: Payer: Self-pay | Admitting: Pulmonary Disease

## 2016-09-10 VITALS — BP 118/80 | HR 80 | Ht 63.0 in | Wt 153.0 lb

## 2016-09-10 DIAGNOSIS — J449 Chronic obstructive pulmonary disease, unspecified: Secondary | ICD-10-CM | POA: Diagnosis not present

## 2016-09-10 DIAGNOSIS — R059 Cough, unspecified: Secondary | ICD-10-CM

## 2016-09-10 DIAGNOSIS — R05 Cough: Secondary | ICD-10-CM | POA: Diagnosis not present

## 2016-09-10 DIAGNOSIS — J301 Allergic rhinitis due to pollen: Secondary | ICD-10-CM | POA: Diagnosis not present

## 2016-09-10 DIAGNOSIS — K2100 Gastro-esophageal reflux disease with esophagitis, without bleeding: Secondary | ICD-10-CM

## 2016-09-10 DIAGNOSIS — R0600 Dyspnea, unspecified: Secondary | ICD-10-CM

## 2016-09-10 MED ORDER — MONTELUKAST SODIUM 10 MG PO TABS: 10.0000 mg | ORAL_TABLET | Freq: Every day | ORAL | 11 refills | Status: DC

## 2016-09-10 MED ORDER — FLUTICASONE-UMECLIDIN-VILANT 100-62.5-25 MCG/INH IN AEPB: 1.0000 | INHALATION_SPRAY | Freq: Every day | RESPIRATORY_TRACT | 0 refills | Status: DC

## 2016-09-10 NOTE — Progress Notes (Signed)
Subjective:    Patient ID: Kathryn Booker, female    DOB: [birth year], 65 y.o.   MRN: [id number]  HPI Chief Complaint  Patient presents with  . Pulm Consult    COPD and asthma. Unproductive cough. Was referred from [facility] due to the cough and it not responding to other medications. CAT score=16   Kathryn Booker is here to see me for COPD.  She was diagnosed with this in the last few years.    She tells me that she had asthma as a child and has dealt with it for life.  She will get flares of this from time to time which includes dyspnea, chest tightness, and wheezing.  She will be treated with steroids and bronchodilators.    She is currently wearing a Holter monitor given recurrent feeling of palpitations.  She says that she has been coughing for years.  She smoked "all her life" and quit smoking in 2015.  The cough is associated with urinary incontinence.  The cough is mostly dry.  She feels like she has drainage from her nose than hangs up in her lungs.  She has been told her COPD has been getting worse.  The cough is truly her primary complaint today.  She says occasionally she will produce clear mucus, but this is rare.  She feels chest congestion frequently however.  This will improve with mucinex.  She can't identify a trigger.  Mayb worse with talking.  She feels more sinus congestion in the mornings which is associated with worsening cough.  The sinus congestion is really a year long problem that she says is worse on the left and the right due to a deviated septum.  She feels like this congestion goes into he rlungs and makes her worse.  She has heartburn and indigestion as well.  She took generic zantac for a while about a year ago which helped.  She still has symptoms now but not as bad lately.    She hasn't seen an allergist in years.  She had immunotherapy for 20 years.    She also notes intermittent wheezing and chest tightness.  She She has experienced dyspnea when strenuous activity,  exposure to cold air, vacuuming.  She hasn't limited her activity but sh ehas used albuterol frequently due to this.  Smoking: quit 2015 after smoking since age 34.  She did quit for 5 years once.  She smoked up to 2 packs a day.     Past Medical History:  Diagnosis Date  . Anxiety   . Asthma   . COPD (chronic obstructive pulmonary disease) ([place])   . Depression   . Elevated cholesterol   . Fatigue   . RBBB (right bundle branch block with left anterior fascicular block)   . Scoliosis   . Serum calcium elevated   . Thyroid disease      Family History  Problem Relation Age of Onset  . Heart failure Mother   . Hypertension Mother   . Hyperlipidemia Mother   . Heart disease Mother   . Liver disease Father   . Alcohol abuse Father      Social History   Social History  . Marital status: Divorced    Spouse name: N/A  . Number of children: N/A  . Years of education: N/A   Occupational History  . Not on file.   Social History Main Topics  . Smoking status: Former Smoker    Packs/day: 2.00    Years: 35.00  Types: Cigarettes    Quit date: 11/19/2013  . Smokeless tobacco: Never Used  . Alcohol use 0.0 oz/week     Comment: social  . Drug use: No  . Sexual activity: Not on file   Other Topics Concern  . Not on file   Social History Narrative   Divorced   No family   Works at [employer] more   Smokes 1.5 ppd   Sedentary   Stress in life     Allergies  Allergen Reactions  . Lipitor [Atorvastatin]     Myalgias   . Tetanus Toxoids Itching     Outpatient Medications Prior to Visit  Medication Sig Dispense Refill  . ADVAIR DISKUS 250-50 MCG/DOSE AEPB INHALE 1 INHALATION BY MOUTH 2 TIMES DAILY, RINSE MOUTH AFTER INHALATION 180 each 2  . albuterol (PROAIR HFA) 108 (90 BASE) MCG/ACT inhaler Inhale 2 puffs into the lungs every 6 (six) hours as needed for wheezing or shortness of breath. 18 g 3  . ALPRAZolam (XANAX) 0.5 MG tablet Take 1 tablet  (0.5 mg total) by mouth 2 (two) times daily as needed for anxiety. 60 tablet 0  . buPROPion (WELLBUTRIN XL) 300 MG 24 hr tablet TAKE 1 TABLET BY MOUTH EVERY DAY 90 tablet 0  . Cholecalciferol (VITAMIN D PO) Take 1,000 Units by mouth.    . citalopram (CELEXA) 40 MG tablet TAKE 1 TABLET (40 MG TOTAL) BY MOUTH DAILY. 90 tablet 0  . cyclobenzaprine (FLEXERIL) 10 MG tablet Take 1 tablet (10 mg total) by mouth at bedtime. 90 tablet 1  . ezetimibe (ZETIA) 10 MG tablet TAKE 1 TABLET (10 MG TOTAL) BY MOUTH DAILY. 90 tablet 1  . guaiFENesin (MUCINEX) 600 MG 12 hr tablet Take by mouth 2 (two) times daily.    . Magnesium 250 MG TABS Take by mouth daily.    . Multiple Vitamins-Minerals (MULTIVITAMIN WITH MINERALS) tablet Take 1 tablet by mouth daily.    . Omega-3 Fatty Acids (FISH OIL) 1000 MG CAPS Take by mouth daily.    [name] OVER THE COUNTER MEDICATION Eye drop 1 drop each eye daily.    [name] triamcinolone (NASACORT ALLERGY 24HR) 55 MCG/ACT AERO nasal inhaler Place 2 sprays into the nose daily.    . valACYclovir (VALTREX) 500 MG tablet TAKE 1 TABLET (500 MG TOTAL) BY MOUTH DAILY. 90 tablet 1  . ranitidine (ZANTAC) 150 MG capsule Take 150 mg by mouth 2 (two) times daily.     No facility-administered medications prior to visit.       Review of Systems  Constitutional: Negative for fever and unexpected weight change.  HENT: Positive for sinus pressure. Negative for congestion, dental problem, ear pain, nosebleeds, postnasal drip, rhinorrhea, sneezing, sore throat and trouble swallowing.   Eyes: Negative for redness and itching.  Respiratory: Positive for cough and shortness of breath. Negative for chest tightness and wheezing.   Cardiovascular: Negative for palpitations and leg swelling.  Gastrointestinal: Negative for nausea and vomiting.  Genitourinary: Negative for dysuria.  Musculoskeletal: Negative for joint swelling.  Skin: Negative for rash.  Neurological: Negative for headaches.  Hematological:  Does not bruise/bleed easily.  Psychiatric/Behavioral: Negative for dysphoric mood. The patient is not nervous/anxious.        Objective:   Physical Exam Vitals:   09/10/16 1111  BP: 118/80  Pulse: 80  SpO2: 98%  Weight: 153 lb (69.4 kg)  Height: 5\' 3"  (1.6 m)   Gen: well appearing, no acute distress HENT: NCAT,  OP clear, neck supple without masses Eyes: PERRL, EOMi Lymph: no cervical lymphadenopathy PULM: Mild wheezing bases, good air movement, freuqent cough CV: RRR, no mgr, no JVD GI: BS+, soft, nontender, no hsm Derm: no rash or skin breakdown MSK: normal bulk and tone Neuro: A&Ox4, CN II-XII intact, strength 5/5 in all 4 extremities Psyche: normal mood and affect   Records from her visit with urgent care last month reviewed were she had a COPD exacerbation.  2017 chest x-ray images first layer reviewed showing an increased AP diameter, evidence of emphysema.     Assessment & Plan:  Impression: Cough COPD Postnasal drip from allergic rhinitis Gastroesophageal reflux disease Asthma  Discussion: 65 year old female comes to our office today for the above listed problems. I think that her cough is mostly due to postnasal drip and acid reflux and persistent laryngeal irritation exacerbating the cough. We need to try to get her allergic rhinitis under better control so I have recommended maximal medical therapy. If she does not benefit from the regimen I have recommended then she may need allergy testing or to consider Xolair.  Her COPD-asthma overlap syndrome is poorly controlled and she recently had an exacerbation. I think she would benefit from the addition of a long-acting muscarinic agent.  For your COPD: We will check a spirometry test today Use the inhaler (Trelegy) 1 puff daily no matter how you feel Stopped taking Advair. If you find that the new inhaler is better than the Advair call us and we will call in a prescription  For your acid reflux: Followed  the lifestyle modification sheet we gave you If this doesn't help then take Zantac daily  For the allergic rhinitis: Increase the dose of Nasacort to 2 sprays each nostril daily Use over-the-counter Zyrtec daily Use Singulair 10 mg daily, we prescribed this today If this is not helpful then we will either refer you to allergy or consider Xolair on the next visit  For your cough: You need to try to suppress your cough to allow your larynx (voice box) to heal.  For three days don't talk, laugh, sing, or clear your throat. Do everything you can to suppress the cough during this time. Use hard candies (sugarless Jolly Ranchers) or non-mint or non-menthol containing cough drops during this time to soothe your throat.  Use a cough suppressant (Delsym or what I have prescribed you) around the clock during this time.  After three days, gradually increase the use of your voice and back off on the cough suppressants.  We will see you back in 6 weeks or sooner if needed  Current Outpatient Prescriptions:  .  ADVAIR DISKUS 250-50 MCG/DOSE AEPB, INHALE 1 INHALATION BY MOUTH 2 TIMES DAILY, RINSE MOUTH AFTER INHALATION, Disp: 180 each, Rfl: 2 .  albuterol (PROAIR HFA) 108 (90 BASE) MCG/ACT inhaler, Inhale 2 puffs into the lungs every 6 (six) hours as needed for wheezing or shortness of breath., Disp: 18 g, Rfl: 3 .  ALPRAZolam (XANAX) 0.5 MG tablet, Take 1 tablet (0.5 mg total) by mouth 2 (two) times daily as needed for anxiety., Disp: 60 tablet, Rfl: 0 .  buPROPion (WELLBUTRIN XL) 300 MG 24 hr tablet, TAKE 1 TABLET BY MOUTH EVERY DAY, Disp: 90 tablet, Rfl: 0 .  Cholecalciferol (VITAMIN D PO), Take 1,000 Units by mouth., Disp: , Rfl:  .  citalopram (CELEXA) 40 MG tablet, TAKE 1 TABLET (40 MG TOTAL) BY MOUTH DAILY., Disp: 90 tablet, Rfl: 0 .  cyclobenzaprine (FLEXERIL) 10  MG tablet, Take 1 tablet (10 mg total) by mouth at bedtime., Disp: 90 tablet, Rfl: 1 .  ezetimibe (ZETIA) 10 MG tablet, TAKE 1 TABLET (10  MG TOTAL) BY MOUTH DAILY., Disp: 90 tablet, Rfl: 1 .  guaiFENesin (MUCINEX) 600 MG 12 hr tablet, Take by mouth 2 (two) times daily., Disp: , Rfl:  .  Magnesium 250 MG TABS, Take by mouth daily., Disp: , Rfl:  .  Multiple Vitamins-Minerals (MULTIVITAMIN WITH MINERALS) tablet, Take 1 tablet by mouth daily., Disp: , Rfl:  .  Omega-3 Fatty Acids (FISH OIL) 1000 MG CAPS, Take by mouth daily., Disp: , Rfl:  .  OVER THE COUNTER MEDICATION, Eye drop 1 drop each eye daily., Disp: , Rfl:  .  triamcinolone (NASACORT ALLERGY 24HR) 55 MCG/ACT AERO nasal inhaler, Place 2 sprays into the nose daily., Disp: , Rfl:  .  valACYclovir (VALTREX) 500 MG tablet, TAKE 1 TABLET (500 MG TOTAL) BY MOUTH DAILY., Disp: 90 tablet, Rfl: 1

## 2016-09-10 NOTE — Patient Instructions (Addendum)
For your COPD: We will check a spirometry test today Use the inhaler (Trelegy) 1 puff daily no matter how you feel Stopped taking Advair. If you find that the new inhaler is better than the Advair call us and we will call in a prescription  For your acid reflux: Followed the lifestyle modification sheet we gave you If this doesn't help then take Zantac daily  For the allergic rhinitis: Increase the dose of Nasacort to 2 sprays each nostril daily Use over-the-counter Zyrtec daily Use Singulair 10 mg daily, we prescribed this today If this is not helpful then we will either refer you to allergy or consider Xolair on the next visit  For your cough: You need to try to suppress your cough to allow your larynx (voice box) to heal.  For three days don't talk, laugh, sing, or clear your throat. Do everything you can to suppress the cough during this time. Use hard candies (sugarless Jolly Ranchers) or non-mint or non-menthol containing cough drops during this time to soothe your throat.  Use a cough suppressant (Delsym or what I have prescribed you) around the clock during this time.  After three days, gradually increase the use of your voice and back off on the cough suppressants.  We will see you back in 6 weeks or sooner if needed

## 2016-09-11 NOTE — Telephone Encounter (Signed)
Dr. [name], the trelogy is not covered  Pt emailed with the following alternatives:  Here's the rest of the list: Respiratory Tract Agents, Other Advair Diskus, Advair HFA Advair Diskus, Advair HFA (Aerosol) 2 QL Anoro Ellipta Anoro Ellipta (Aerosol Powder) 2 QL Bevespi Aerosphere Bevespi Aerosphere (Aerosol) 2 QL Breo Ellipta Breo Ellipta (Aerosol Powder) 2 QL Combivent Respimat Combivent Respimat (Aerosol Solution) 2 Dymista Dymista (Suspension) 3 Ipratropium Bromide/Albuterol Sulfate Ipratropium Bromide/ Albuterol Sulfate (Inhalation Solution) 1 B/D, PA Stiolto Respimat Stiolto Respimat (Aerosol Solution) 2 QL Symbicort Symbicort (Aerosol) 2 QL  Please advise thanks

## 2016-09-15 ENCOUNTER — Encounter: Payer: Self-pay | Admitting: Cardiovascular Disease

## 2016-09-16 ENCOUNTER — Ambulatory Visit (HOSPITAL_COMMUNITY)
Admission: RE | Admit: 2016-09-16 | Discharge: 2016-09-16 | Disposition: A | Discharge status: Home / Self Care | Payer: BC Managed Care – PPO | Source: Ambulatory Visit | Attending: Pulmonary Disease | Admitting: Pulmonary Disease

## 2016-09-16 ENCOUNTER — Encounter: Payer: Self-pay | Admitting: Pulmonary Disease

## 2016-09-16 DIAGNOSIS — R0600 Dyspnea, unspecified: Secondary | ICD-10-CM | POA: Diagnosis not present

## 2016-09-16 LAB — PULMONARY FUNCTION TEST
DL/VA % pred: 93 %
DL/VA: 4.38 ml/min/mmHg/L
DLCO unc % pred: 82 %
DLCO unc: 18.86 ml/min/mmHg
FEF 25-75 Post: 1.61 L/sec
FEF 25-75 Pre: 1.29 L/sec
FEF2575-%CHANGE-POST: 24 %
FEF2575-%PRED-POST: 76 %
FEF2575-%PRED-PRE: 61 %
FEV1-%Change-Post: 10 %
FEV1-%PRED-POST: 86 %
FEV1-%Pred-Pre: 77 %
FEV1-PRE: 1.82 L
FEV1-Post: 2.02 L
FEV1FVC-%CHANGE-POST: 1 %
FEV1FVC-%Pred-Pre: 93 %
FEV6-%Change-Post: 8 %
FEV6-%PRED-PRE: 85 %
FEV6-%Pred-Post: 93 %
FEV6-PRE: 2.53 L
FEV6-Post: 2.74 L
FEV6FVC-%Change-Post: 0 %
FEV6FVC-%PRED-PRE: 104 %
FEV6FVC-%Pred-Post: 104 %
FVC-%CHANGE-POST: 8 %
FVC-%PRED-POST: 89 %
FVC-%PRED-PRE: 82 %
FVC-POST: 2.74 L
FVC-PRE: 2.53 L
POST FEV1/FVC RATIO: 74 %
POST FEV6/FVC RATIO: 100 %
Pre FEV1/FVC ratio: 72 %
Pre FEV6/FVC Ratio: 100 %
RV % PRED: 276 %
RV: 5.62 L
TLC % pred: 166 %
TLC: 8.18 L

## 2016-09-16 MED ORDER — ALBUTEROL SULFATE (2.5 MG/3ML) 0.083% IN NEBU
2.5000 mg | INHALATION_SOLUTION | Freq: Once | RESPIRATORY_TRACT | Status: DC
  Administered 2016-09-16: 2.5 mg via RESPIRATORY_TRACT

## 2016-09-17 NOTE — Telephone Encounter (Signed)
Dr [name], the pt is emailing for results of spirometry, looks like she also had full PFT's  Please advise thanks!

## 2016-09-23 ENCOUNTER — Other Ambulatory Visit: Payer: Self-pay | Admitting: *Deleted

## 2016-09-23 ENCOUNTER — Telehealth: Payer: Self-pay | Admitting: Pulmonary Disease

## 2016-09-23 DIAGNOSIS — I480 Paroxysmal atrial fibrillation: Secondary | ICD-10-CM

## 2016-09-23 NOTE — Telephone Encounter (Signed)
Spoke with patient. She sent a transmission to [place] so that we can see what her rhythm is currently.   She has already been taking asa 81 mg.  I added this to her medication list.   Today she is feeling some fluttering occasionally, she is doing her daily routine otherwise.  Using albuterol inhaler 2 puffs bid, this is an increase for her in general, typically has a rescue inhaler but thinks her COPD has worsened as using more.    She is aware that I will am forwarding to Dr. Johnsie Cancel and once we receive her transmission will review with Dr. Tamala Julian.

## 2016-09-23 NOTE — Telephone Encounter (Signed)
During flutter feeling she was in NSR with no arrhythmia HR only 80

## 2016-09-23 NOTE — Telephone Encounter (Signed)
BQ pt is calling for results of the PFT/  Please advise. thanks

## 2016-09-23 NOTE — Telephone Encounter (Signed)
lifewatch report from 11:18 am today (during last phone call)--is SR, (BBB), HR 80.  Reviewed by Dr. Tamala Julian. Reports placed in Dr. Kyla Balzarine mailbox for review.

## 2016-09-23 NOTE — Telephone Encounter (Signed)
Received Lif[facility] monitor report for 09/23/16 2:39 am Atrial fibrillation, sinus rhythm with IVCD. HR140-160 bpm  According to report, Kathryn Booker sensed palpitations and rapid heart rate while laying down.  Attempted to reach patient via all 3 numbers listed.  Advised to call back to triage.  Reviewed with Dr. Tamala Julian. Recommends patient send a transmission to see what her rhythm is currently, see if she is symptomatic and start baby aspirin.  ;

## 2016-09-23 NOTE — Telephone Encounter (Signed)
Follow up    Pt verbalized that she is retuning call for rn

## 2016-09-24 MED ORDER — ASPIRIN EC 325 MG PO TBEC: 325.0000 mg | DELAYED_RELEASE_TABLET | Freq: Every day | ORAL | Status: DC

## 2016-09-24 NOTE — Telephone Encounter (Signed)
Consistent with Asthma, continue with current management

## 2016-09-24 NOTE — Addendum Note (Signed)
Addended by: Aris Georgia, Paije Goodhart L on: 09/24/2016 04:22 PM   Modules accepted: Orders

## 2016-09-24 NOTE — Telephone Encounter (Signed)
Patient wants Dr. Johnsie Cancel to review her monitor readings when she was in [facility]. Medical records is scanning report into patient's chart and will send copy to Dr. Johnsie Cancel. Final report will not be processed until the end of the month. Will forward to Dr. Johnsie Cancel, so he is aware.

## 2016-09-24 NOTE — Telephone Encounter (Addendum)
BQ was in the office - discussed with him Per BQ: can add Spiriva or Incruse to the Advair to help E-mail sent to patient with PFT information and inhaler names Asked pt to let us know which inhaler is covered under her insurance and that BQ will be off until 12/28

## 2016-09-24 NOTE — Telephone Encounter (Signed)
She is having PAF I had a long talk with her on phone CHADVASC only one so no anticoagulation she will increase asa to 324 mg Start cardizem CD 240 mg for rate control Start flecainide 50 bid ( thyroid and renal normal in July) She is having echo Friday Needs exercise myovue 7-10 days after starting flecainide Can f/u with me next available   I am going to be in office 10/09/15 so can see then

## 2016-09-24 NOTE — Telephone Encounter (Signed)
Called spoke with patient to discuss PFT results/recs per BQ Pt is asking if she indeed has COPD (which is the dx she was given previously) or just asthma?  Or asthma on top of COPD?  Regarding Trelegy and Advair: pt finished her Trelegy sample yesterday and has 10 days let of Advair.  She stated that the Trelegy improved her breathing (thought not a "miracle drug") BUT it is not covered by insurance.  Pt did try to weed through her insurance formulary but was unable to discern covered alternatives.  Pt is asking if BQ can offer some names of medications with their 'generic names' so that she has points of reference to check within her formulary.    Pt stated she is okay with the reply going to her MyChart account so that she can have the names to view. BQ please advise on inhaler recommendations, thank you.  While researching in patient's chart, did see BQ respond: Juanito Doom, MD 1:29 PM  Please let her know this looked more like asthma than COPD. Continue current recommended therapy.

## 2016-09-24 NOTE — Telephone Encounter (Signed)
Left message for patient to call back  

## 2016-09-24 NOTE — Addendum Note (Signed)
Addended by: Aris Georgia, Sharina Petre L on: 09/24/2016 03:38 PM   Modules accepted: Orders

## 2016-09-24 NOTE — Telephone Encounter (Signed)
Since there is a drug interaction with patient's antidepressants and flecainide, Dr. Johnsie Cancel wants patient to see A. Fib clinic this week for antiarrhythmia medication. Patient has an appointment tomorrow with [facility] clinic at 3:00 pm. Informed patient that her Brantley Fling is scheduled for 10/02/16, if she still needs the test. Will forward to Roderic Palau NP to see if patient still needs Myoview after her office visit with her tomorrow. Called patient about Dr. Kyla Balzarine recommendations and changes. Patient will have a follow-up appointment with Dr. Johnsie Cancel on 10/08/16 at 4:15  if needed. Patient stated she would start the higher dose of ASA.

## 2016-09-25 ENCOUNTER — Ambulatory Visit (HOSPITAL_COMMUNITY)
Admission: RE | Admit: 2016-09-25 | Discharge: 2016-09-25 | Disposition: A | Discharge status: Home / Self Care | Payer: BC Managed Care – PPO | Source: Ambulatory Visit | Attending: Nurse Practitioner | Admitting: Nurse Practitioner

## 2016-09-25 VITALS — BP 140/80 | HR 84 | Ht 63.0 in | Wt 155.0 lb

## 2016-09-25 DIAGNOSIS — Z888 Allergy status to other drugs, medicaments and biological substances status: Secondary | ICD-10-CM | POA: Diagnosis not present

## 2016-09-25 DIAGNOSIS — Z811 Family history of alcohol abuse and dependence: Secondary | ICD-10-CM | POA: Insufficient documentation

## 2016-09-25 DIAGNOSIS — Z8249 Family history of ischemic heart disease and other diseases of the circulatory system: Secondary | ICD-10-CM | POA: Insufficient documentation

## 2016-09-25 DIAGNOSIS — F419 Anxiety disorder, unspecified: Secondary | ICD-10-CM | POA: Diagnosis not present

## 2016-09-25 DIAGNOSIS — Z87891 Personal history of nicotine dependence: Secondary | ICD-10-CM | POA: Diagnosis not present

## 2016-09-25 DIAGNOSIS — Z7982 Long term (current) use of aspirin: Secondary | ICD-10-CM | POA: Insufficient documentation

## 2016-09-25 DIAGNOSIS — I480 Paroxysmal atrial fibrillation: Secondary | ICD-10-CM | POA: Insufficient documentation

## 2016-09-25 DIAGNOSIS — F329 Major depressive disorder, single episode, unspecified: Secondary | ICD-10-CM | POA: Diagnosis not present

## 2016-09-25 DIAGNOSIS — J449 Chronic obstructive pulmonary disease, unspecified: Secondary | ICD-10-CM | POA: Diagnosis not present

## 2016-09-25 DIAGNOSIS — Z9889 Other specified postprocedural states: Secondary | ICD-10-CM | POA: Diagnosis not present

## 2016-09-25 DIAGNOSIS — E079 Disorder of thyroid, unspecified: Secondary | ICD-10-CM | POA: Diagnosis not present

## 2016-09-25 DIAGNOSIS — Z8379 Family history of other diseases of the digestive system: Secondary | ICD-10-CM | POA: Diagnosis not present

## 2016-09-25 DIAGNOSIS — M419 Scoliosis, unspecified: Secondary | ICD-10-CM | POA: Diagnosis not present

## 2016-09-25 DIAGNOSIS — Z79899 Other long term (current) drug therapy: Secondary | ICD-10-CM | POA: Insufficient documentation

## 2016-09-25 DIAGNOSIS — E7800 Pure hypercholesterolemia, unspecified: Secondary | ICD-10-CM | POA: Diagnosis not present

## 2016-09-25 DIAGNOSIS — I4510 Unspecified right bundle-branch block: Secondary | ICD-10-CM | POA: Diagnosis not present

## 2016-09-25 MED ORDER — DILTIAZEM HCL ER COATED BEADS 120 MG PO CP24: 120.0000 mg | ORAL_CAPSULE | Freq: Every day | ORAL | 6 refills | Status: DC

## 2016-09-25 NOTE — Progress Notes (Signed)
Primary Care Physician: Vicie Mutters, PA-C Referring Physician: Dr. Carol Ada is a 65 y.o. female who presents to the afib clinic for evaluation. She has had c/o of palpitations for a couple of months. Event monitor placed by Dr. Mingo Amber, which she is still wearing, and showed paroxysmal afib. Dr. Johnsie Cancel wanted pt started on flecainide and cardizem, however, a flag came up with antidepressant and flecainide due to potential for qtc prolongation and she was asked to come here for further options.  Review of lifestyle shows that she will drink cocktails in the evening at least 4 nights a week, can be 1-4 drinks. She consumes  moderate to high caffeine intake. She does snore terribly and wakes up often in the night. She had a sleep study scheduled earlier this year but put off. She does not exercise and is overweight.  She has a CHA2DS2VASc score of 1, for female, but will turn 69 in July and will then have a chadsvasc score of 2. For now will continue on 325 mg of asa. Echo is pending.EKG today with SR and RBBB.  Today, she denies symptoms of palpitations, chest pain, shortness of breath, orthopnea, PND, lower extremity edema, dizziness, presyncope, syncope, or neurologic sequela. The patient is tolerating medications without difficulties and is otherwise without complaint today.   Past Medical History:  Diagnosis Date  . Anxiety   . Asthma   . COPD (chronic obstructive pulmonary disease) ([place])   . Depression   . Elevated cholesterol   . Fatigue   . RBBB (right bundle branch block with left anterior fascicular block)   . Scoliosis   . Serum calcium elevated   . Thyroid disease    Past Surgical History:  Procedure Laterality Date  . APPENDECTOMY    . LAPROSCOPIC     X 2 IN EARLY 30-40'S  . OVARIAN CYST REMOVAL    . RADIOACTIVE SEED GUIDED EXCISIONAL BREAST BIOPSY Right 05/15/2016   Procedure: RIGHT RADIOACTIVE SEED GUIDED EXCISIONAL BREAST BIOPSY;  Surgeon: Rolm Bookbinder, MD;  Location: [facility];  Service: General;  Laterality: Right;  RIGHT RADIOACTIVE SEED GUIDED EXCISIONAL BREAST BIOPSY  . TONSILECTOMY, ADENOIDECTOMY, BILATERAL MYRINGOTOMY AND TUBES      Current Outpatient Prescriptions  Medication Sig Dispense Refill  . ADVAIR DISKUS 250-50 MCG/DOSE AEPB INHALE 1 INHALATION BY MOUTH 2 TIMES DAILY, RINSE MOUTH AFTER INHALATION 180 each 2  . albuterol (PROAIR HFA) 108 (90 BASE) MCG/ACT inhaler Inhale 2 puffs into the lungs every 6 (six) hours as needed for wheezing or shortness of breath. 18 g 3  . ALPRAZolam (XANAX) 0.5 MG tablet Take 1 tablet (0.5 mg total) by mouth 2 (two) times daily as needed for anxiety. 60 tablet 0  . aspirin EC 325 MG tablet Take 1 tablet (325 mg total) by mouth daily.    [name] buPROPion (WELLBUTRIN XL) 300 MG 24 hr tablet TAKE 1 TABLET BY MOUTH EVERY DAY 90 tablet 0  . Cholecalciferol (VITAMIN D PO) Take 1,000 Units by mouth.    . citalopram (CELEXA) 40 MG tablet TAKE 1 TABLET (40 MG TOTAL) BY MOUTH DAILY. 90 tablet 0  . cyclobenzaprine (FLEXERIL) 10 MG tablet Take 1 tablet (10 mg total) by mouth at bedtime. 90 tablet 1  . ezetimibe (ZETIA) 10 MG tablet TAKE 1 TABLET (10 MG TOTAL) BY MOUTH DAILY. 90 tablet 1  . guaiFENesin (MUCINEX) 600 MG 12 hr tablet Take by mouth 2 (two) times daily.    [name]  Magnesium 250 MG TABS Take by mouth daily.    . montelukast (SINGULAIR) 10 MG tablet Take 1 tablet (10 mg total) by mouth at bedtime. 30 tablet 11  . Multiple Vitamins-Minerals (MULTIVITAMIN WITH MINERALS) tablet Take 1 tablet by mouth daily.    . Omega-3 Fatty Acids (FISH OIL) 1000 MG CAPS Take by mouth daily.    [name] OVER THE COUNTER MEDICATION Eye drop 1 drop each eye daily.    [name] triamcinolone (NASACORT ALLERGY 24HR) 55 MCG/ACT AERO nasal inhaler Place 2 sprays into the nose daily.    . valACYclovir (VALTREX) 500 MG tablet TAKE 1 TABLET (500 MG TOTAL) BY MOUTH DAILY. 90 tablet 1  . diltiazem (CARDIZEM CD) 120 MG 24 hr  capsule Take 1 capsule (120 mg total) by mouth daily. 30 capsule 6   No current facility-administered medications for this encounter.     Allergies  Allergen Reactions  . Lipitor [Atorvastatin]     Myalgias   . Tetanus Toxoids Itching    Social History   Social History  . Marital status: Divorced    Spouse name: N/A  . Number of children: N/A  . Years of education: N/A   Occupational History  . Not on file.   Social History Main Topics  . Smoking status: Former Smoker    Packs/day: 2.00    Years: 35.00    Types: Cigarettes    Quit date: 11/19/2013  . Smokeless tobacco: Never Used  . Alcohol use 0.0 oz/week     Comment: social  . Drug use: No  . Sexual activity: Not on file   Other Topics Concern  . Not on file   Social History Narrative   Divorced   No family   Works at [employer] more   Smokes 1.5 ppd   Sedentary   Stress in life    Family History  Problem Relation Age of Onset  . Heart failure Mother   . Hypertension Mother   . Hyperlipidemia Mother   . Heart disease Mother   . Liver disease Father   . Alcohol abuse Father     ROS- All systems are reviewed and negative except as per the HPI above  Physical Exam: Vitals:   09/25/16 1503  BP: 140/80  Pulse: 84  Weight: 155 lb (70.3 kg)  Height: 5\' 3"  (1.6 m)   Wt Readings from Last 3 Encounters:  09/25/16 155 lb (70.3 kg)  09/10/16 153 lb (69.4 kg)  09/04/16 153 lb 6.4 oz (69.6 kg)    Labs: Lab Results  Component Value Date   NA 137 05/02/2016   K 4.6 05/02/2016   CL 102 05/02/2016   CO2 26 05/02/2016   GLUCOSE 116 (H) 05/02/2016   BUN 13 05/02/2016   CREATININE 0.87 05/02/2016   CALCIUM 10.7 (H) 05/02/2016   MG 1.8 05/02/2016   No results found for: INR Lab Results  Component Value Date   CHOL 216 (H) 05/02/2016   HDL 55 05/02/2016   LDLCALC 121 05/02/2016   TRIG 201 (H) 05/02/2016     GEN- The patient is well appearing, alert and oriented x 3 today.     Head- normocephalic, atraumatic Eyes-  Sclera clear, conjunctiva pink Ears- hearing intact Oropharynx- clear Neck- supple, no JVP Lymph- no cervical lymphadenopathy Lungs- Clear to ausculation bilaterally, normal work of breathing Heart- Regular rate and rhythm, no murmurs, rubs or gallops, PMI not laterally displaced GI- soft, NT, ND, + BS Extremities- no  clubbing, cyanosis, or edema MS- no significant deformity or atrophy Skin- no rash or lesion Psych- euthymic mood, full affect Neuro- strength and sensation are intact  EKG- NSR at 84 bpm, pr int 119 ms, qrs int 134 ms, qtc 472 bpm Epic records reviewed   Echo pending Event monitor strips reviewed which did show afib  Assessment and Plan: 1. Paroxysmal afib Start 120 mg Cardizem  to encourage SR Hold off on antiarrythmic for now Will work on lifestyle by limiting alcohol, caffeine Sleep study ordered Continue wearing event monitor Continue for now 325 mg asa for chadsvasc score of 1(female)  F/u in 3 weeks  Butch Penny C. Carroll, [facility] [address] [place], [place] [address] [phone]

## 2016-09-25 NOTE — Patient Instructions (Signed)
Your physician has recommended you make the following change in your medication:  1)Start Cardizem 120mg  once a day  Sleep study scheduler will be in touch with you once they talk with your insurance  Reduce caffeine and alcohol intake

## 2016-09-25 NOTE — Telephone Encounter (Signed)
12.20.17 e-mail from patient: Message   Kathryn Booker, I finally was able to speak to someone and they said that the doctor can get the Trelegy approved if he will call and verify it's a medical necessity. The number is [phone]. If Dr. [name] thinks it would better treat my asthma to add Incruse to my Advair, he can also get that approved by calling the same number.  I understand it will be next week before you can ask him what he feels is best. Thanks for all of your help.   Called the number patient provided to check for covered alternatives - Spiriva Resp 2.24mcg does NOT require PA.  E-mail sent to patient to inform her of this and see if she would like to proceed with Rx or try for the Trelegy PA when BQ returns to the office.

## 2016-09-26 ENCOUNTER — Ambulatory Visit (HOSPITAL_COMMUNITY): Payer: BC Managed Care – PPO | Attending: Cardiology

## 2016-09-26 ENCOUNTER — Other Ambulatory Visit: Payer: Self-pay | Admitting: *Deleted

## 2016-09-26 DIAGNOSIS — I5189 Other ill-defined heart diseases: Secondary | ICD-10-CM | POA: Insufficient documentation

## 2016-09-26 DIAGNOSIS — I480 Paroxysmal atrial fibrillation: Secondary | ICD-10-CM

## 2016-09-26 DIAGNOSIS — R002 Palpitations: Secondary | ICD-10-CM | POA: Diagnosis not present

## 2016-09-30 NOTE — Telephone Encounter (Signed)
BQ please advise. thanks 

## 2016-10-02 ENCOUNTER — Encounter (HOSPITAL_COMMUNITY): Payer: BC Managed Care – PPO

## 2016-10-02 NOTE — Telephone Encounter (Signed)
OK to take Incruise daily

## 2016-10-03 ENCOUNTER — Encounter (HOSPITAL_COMMUNITY): Payer: Self-pay | Admitting: Nurse Practitioner

## 2016-10-03 MED ORDER — UMECLIDINIUM BROMIDE 62.5 MCG/INH IN AEPB: 1.0000 | INHALATION_SPRAY | Freq: Every day | RESPIRATORY_TRACT | 3 refills | Status: DC

## 2016-10-03 NOTE — Telephone Encounter (Signed)
Spoke with the pt and advised BQ states she can take Incruse instead of Trelogy  She wants to know before we call this in if she can keep taking the advair while on Incruse  Please advise thanks!

## 2016-10-03 NOTE — Telephone Encounter (Signed)
Pt called back again  She states that she wants me to go ahead and call in Incruse and I have done so  She also wants BQ to to know she was recently started on Diltiazem 120 mg daily for Afib  I advised will let him know this and will call her back regarding advair   Please advise if ok to take advair with incruse thanks

## 2016-10-06 NOTE — Telephone Encounter (Signed)
OK to take Advair and INcruise

## 2016-10-07 ENCOUNTER — Encounter: Payer: Self-pay | Admitting: *Deleted

## 2016-10-07 MED ORDER — UMECLIDINIUM BROMIDE 62.5 MCG/INH IN AEPB: 1.0000 | INHALATION_SPRAY | Freq: Every day | RESPIRATORY_TRACT | 11 refills | Status: DC

## 2016-10-07 NOTE — Telephone Encounter (Signed)
lmomtcb x1 

## 2016-10-07 NOTE — Telephone Encounter (Signed)
Patient returned call, please call patient at work at [phone] ext [phone], ok to leave a msg if she does not answer.

## 2016-10-07 NOTE — Telephone Encounter (Signed)
I called the number given by the pt to initiate the PA for the incruse.  I was informed that the incruse is now covered by the pts insurance and did not require a PA. The refill has been sent in and nothing further is needed.

## 2016-10-09 ENCOUNTER — Telehealth: Payer: Self-pay | Admitting: Cardiovascular Disease

## 2016-10-09 NOTE — Telephone Encounter (Signed)
Called patient back with event monitor results.

## 2016-10-09 NOTE — Telephone Encounter (Signed)
New Message   Patient returning phone call. Unsure who called

## 2016-10-14 ENCOUNTER — Encounter: Payer: Self-pay | Admitting: Internal Medicine

## 2016-10-14 ENCOUNTER — Ambulatory Visit (INDEPENDENT_AMBULATORY_CARE_PROVIDER_SITE_OTHER): Payer: BC Managed Care – PPO | Admitting: Internal Medicine

## 2016-10-14 VITALS — BP 108/70 | HR 86 | Temp 97.8°F | Resp 18 | Ht 63.0 in

## 2016-10-14 DIAGNOSIS — J0141 Acute recurrent pansinusitis: Secondary | ICD-10-CM

## 2016-10-14 MED ORDER — PROMETHAZINE-DM 6.25-15 MG/5ML PO SYRP: ORAL_SOLUTION | ORAL | 1 refills | Status: DC

## 2016-10-14 MED ORDER — LEVOFLOXACIN 750 MG PO TABS: 750.0000 mg | ORAL_TABLET | Freq: Every day | ORAL | 0 refills | Status: DC

## 2016-10-14 MED ORDER — PREDNISONE 20 MG PO TABS: ORAL_TABLET | ORAL | 0 refills | Status: DC

## 2016-10-14 NOTE — Progress Notes (Signed)
HPI  Patient presents to the office for evaluation of cough.  It has been going on for 1 weeks.  Initially it started as fatigue.  She reports that she had some fevers and chills and a temperature of 101.5.  She reports that she thought she had the flu.  She reports that the next day she improved some.  Patient reports night > day, dry, barky, not related to position.  They also endorse change in voice, chills, fever, postnasal drip and nasal congestion, and post nasal drainage, coughing, diarrhea, poor appetite.  .  They have tried sudafed, inhalers, singulair, flonase, and astelin.  They report that nothing has worked.  They reports other sick contacts.  She notes that her nephew has recently been sick.  She reports that no sick coworkers.    Review of Systems  Constitutional: Positive for malaise/fatigue. Negative for chills and fever.  HENT: Positive for congestion, ear pain, hearing loss and sore throat.   Respiratory: Positive for cough. Negative for sputum production, shortness of breath and wheezing.   Cardiovascular: Negative for chest pain, palpitations and leg swelling.  Neurological: Positive for headaches.    PE:  Vitals:   10/14/16 1437  BP: 108/70  Pulse: 86  Resp: 18  Temp: 97.8 F (36.6 C)   General:  Alert and non-toxic, WDWN, NAD HEENT: NCAT, PERLA, EOM normal, no occular discharge or erythema.  Nasal mucosal edema with sinus tenderness to palpation.  Oropharynx clear with minimal oropharyngeal edema and erythema.  Mucous membranes moist and pink. Neck:  Cervical adenopathy Chest:  RRR no MRGs.  Lungs clear to auscultation A&P with no wheezes rhonchi or rales.   Abdomen: +BS x 4 quadrants, soft, non-tender, no guarding, rigidity, or rebound. Skin: warm and dry no rash Neuro: A&Ox4, CN II-XII grossly intact  Assessment and Plan:   1. Acute recurrent pansinusitis -phenergan dm -prednisne -cont incruse -cont albuterol q4-6 hrs prn for cough -cont advair -cont  nasacort -levaquin x7 days -cont mucinex -tylenol as needed -if no improvement needs to have repeat OV -if worsening SOB, wheezing or any other concerns patient needs to go to the ER

## 2016-10-16 ENCOUNTER — Inpatient Hospital Stay (HOSPITAL_COMMUNITY)
Admission: RE | Admit: 2016-10-16 | Payer: BC Managed Care – PPO | Source: Ambulatory Visit | Admitting: Nurse Practitioner

## 2016-10-23 ENCOUNTER — Ambulatory Visit (HOSPITAL_COMMUNITY): Payer: BC Managed Care – PPO | Admitting: Nurse Practitioner

## 2016-10-25 ENCOUNTER — Other Ambulatory Visit: Payer: Self-pay | Admitting: Physician Assistant

## 2016-10-26 ENCOUNTER — Other Ambulatory Visit: Payer: Self-pay | Admitting: Physician Assistant

## 2016-10-27 ENCOUNTER — Encounter (HOSPITAL_COMMUNITY): Payer: Self-pay | Admitting: Nurse Practitioner

## 2016-10-27 ENCOUNTER — Ambulatory Visit (HOSPITAL_COMMUNITY)
Admission: RE | Admit: 2016-10-27 | Discharge: 2016-10-27 | Disposition: A | Discharge status: Home / Self Care | Payer: BC Managed Care – PPO | Source: Ambulatory Visit | Attending: Nurse Practitioner | Admitting: Nurse Practitioner

## 2016-10-27 ENCOUNTER — Telehealth: Payer: Self-pay | Admitting: Pulmonary Disease

## 2016-10-27 VITALS — BP 166/88 | HR 76 | Ht 63.0 in | Wt 149.6 lb

## 2016-10-27 DIAGNOSIS — Z79899 Other long term (current) drug therapy: Secondary | ICD-10-CM | POA: Insufficient documentation

## 2016-10-27 DIAGNOSIS — Z9889 Other specified postprocedural states: Secondary | ICD-10-CM | POA: Insufficient documentation

## 2016-10-27 DIAGNOSIS — J449 Chronic obstructive pulmonary disease, unspecified: Secondary | ICD-10-CM | POA: Insufficient documentation

## 2016-10-27 DIAGNOSIS — Z811 Family history of alcohol abuse and dependence: Secondary | ICD-10-CM | POA: Diagnosis not present

## 2016-10-27 DIAGNOSIS — Z888 Allergy status to other drugs, medicaments and biological substances status: Secondary | ICD-10-CM | POA: Insufficient documentation

## 2016-10-27 DIAGNOSIS — M419 Scoliosis, unspecified: Secondary | ICD-10-CM | POA: Diagnosis not present

## 2016-10-27 DIAGNOSIS — R030 Elevated blood-pressure reading, without diagnosis of hypertension: Secondary | ICD-10-CM | POA: Diagnosis not present

## 2016-10-27 DIAGNOSIS — E079 Disorder of thyroid, unspecified: Secondary | ICD-10-CM | POA: Diagnosis not present

## 2016-10-27 DIAGNOSIS — E669 Obesity, unspecified: Secondary | ICD-10-CM | POA: Insufficient documentation

## 2016-10-27 DIAGNOSIS — Z8249 Family history of ischemic heart disease and other diseases of the circulatory system: Secondary | ICD-10-CM | POA: Insufficient documentation

## 2016-10-27 DIAGNOSIS — I4510 Unspecified right bundle-branch block: Secondary | ICD-10-CM | POA: Diagnosis not present

## 2016-10-27 DIAGNOSIS — E7800 Pure hypercholesterolemia, unspecified: Secondary | ICD-10-CM | POA: Diagnosis not present

## 2016-10-27 DIAGNOSIS — F329 Major depressive disorder, single episode, unspecified: Secondary | ICD-10-CM | POA: Diagnosis not present

## 2016-10-27 DIAGNOSIS — Z7982 Long term (current) use of aspirin: Secondary | ICD-10-CM | POA: Diagnosis not present

## 2016-10-27 DIAGNOSIS — F419 Anxiety disorder, unspecified: Secondary | ICD-10-CM | POA: Diagnosis not present

## 2016-10-27 DIAGNOSIS — E6609 Other obesity due to excess calories: Secondary | ICD-10-CM | POA: Diagnosis not present

## 2016-10-27 DIAGNOSIS — I480 Paroxysmal atrial fibrillation: Secondary | ICD-10-CM | POA: Diagnosis present

## 2016-10-27 DIAGNOSIS — Z87891 Personal history of nicotine dependence: Secondary | ICD-10-CM | POA: Insufficient documentation

## 2016-10-27 DIAGNOSIS — I481 Persistent atrial fibrillation: Secondary | ICD-10-CM

## 2016-10-27 DIAGNOSIS — I4819 Other persistent atrial fibrillation: Secondary | ICD-10-CM

## 2016-10-27 NOTE — Progress Notes (Signed)
Primary Care Physician: Vicie Mutters, PA-C Primary Cardiologist: Cierria Kogan is a 66 y.o. female with a history of paroxysmal atrial fibrillation who presents for follow up in the [facility].  The patient was initially diagnosed with atrial fibrillation 09/2016 after presenting with symptoms of palpitations.  She wore an event monitor which interestingly showed paroxysmal atrial fibrillation but also periods of palpitations where the patient was in SR.  She was started on Cardizem at last visit with AF clinic and presents today for follow up.  She has had fewer palpitations while on Cardizem.  She is recovering from the flu and bronchitis and has just finished a prednisone dose pack.   She cancelled her sleep study as she felt that it would be prohibitively expensive and she does not have anyone to watch her animals for the night. In review of OSA symptoms, she has a long history of snoring but wakes up refreshed and does not fall asleep easily during the day.  We discussed the option of a home sleep study, but she does not want to pursue at this time.   Today, she denies symptoms of chest pain, shortness of breath, orthopnea, PND, lower extremity edema, dizziness, presyncope, syncope, snoring, daytime somnolence, bleeding, or neurologic sequela. The patient is tolerating medications without difficulties and is otherwise without complaint today.    Atrial Fibrillation Risk Factors:  she does not have symptoms or diagnosis of sleep apnea.  she does not have a history of rheumatic fever.  she does have a history of alcohol use.  she has a BMI of Body mass index is 26.5 kg/m.[name] Filed Weights   10/27/16 1555  Weight: 149 lb 9.6 oz (67.9 kg)    LA size: 39   Atrial Fibrillation Management history:  Previous antiarrhythmic drugs: none  Previous cardioversions: none  Previous ablations: none  CHADS2VASC score: 1  Anticoagulation history:  none   Past Medical History:  Diagnosis Date  . Anxiety   . Asthma   . COPD (chronic obstructive pulmonary disease) ([place])   . Depression   . Elevated cholesterol   . Fatigue   . RBBB (right bundle branch block with left anterior fascicular block)   . Scoliosis   . Serum calcium elevated   . Thyroid disease    Past Surgical History:  Procedure Laterality Date  . APPENDECTOMY    . LAPROSCOPIC     X 2 IN EARLY 30-40'S  . OVARIAN CYST REMOVAL    . RADIOACTIVE SEED GUIDED EXCISIONAL BREAST BIOPSY Right 05/15/2016   Procedure: RIGHT RADIOACTIVE SEED GUIDED EXCISIONAL BREAST BIOPSY;  Surgeon: Rolm Bookbinder, MD;  Location: [facility];  Service: General;  Laterality: Right;  RIGHT RADIOACTIVE SEED GUIDED EXCISIONAL BREAST BIOPSY  . TONSILECTOMY, ADENOIDECTOMY, BILATERAL MYRINGOTOMY AND TUBES      Current Outpatient Prescriptions  Medication Sig Dispense Refill  . ADVAIR DISKUS 250-50 MCG/DOSE AEPB INHALE 1 INHALATION BY MOUTH 2 TIMES DAILY, RINSE MOUTH AFTER INHALATION 180 each 2  . albuterol (PROAIR HFA) 108 (90 BASE) MCG/ACT inhaler Inhale 2 puffs into the lungs every 6 (six) hours as needed for wheezing or shortness of breath. 18 g 3  . ALPRAZolam (XANAX) 0.5 MG tablet Take 1 tablet (0.5 mg total) by mouth 2 (two) times daily as needed for anxiety. 60 tablet 0  . aspirin EC 325 MG tablet Take 1 tablet (325 mg total) by mouth daily.    [name] buPROPion (WELLBUTRIN XL)  300 MG 24 hr tablet TAKE 1 TABLET BY MOUTH EVERY DAY 90 tablet 0  . Cholecalciferol (VITAMIN D PO) Take 1,000 Units by mouth.    . citalopram (CELEXA) 40 MG tablet TAKE 1 TABLET BY MOUTH EVERY DAY 90 tablet 1  . cyclobenzaprine (FLEXERIL) 10 MG tablet Take 1 tablet (10 mg total) by mouth at bedtime. 90 tablet 1  . diltiazem (CARDIZEM CD) 120 MG 24 hr capsule Take 1 capsule (120 mg total) by mouth daily. 30 capsule 6  . ezetimibe (ZETIA) 10 MG tablet TAKE 1 TABLET (10 MG TOTAL) BY MOUTH DAILY. 30 tablet 0  .  guaiFENesin (MUCINEX) 600 MG 12 hr tablet Take by mouth 2 (two) times daily.    . Magnesium 250 MG TABS Take by mouth daily.    . montelukast (SINGULAIR) 10 MG tablet Take 1 tablet (10 mg total) by mouth at bedtime. 30 tablet 11  . Multiple Vitamins-Minerals (MULTIVITAMIN WITH MINERALS) tablet Take 1 tablet by mouth daily.    . Omega-3 Fatty Acids (FISH OIL) 1000 MG CAPS Take by mouth daily.    [name] OVER THE COUNTER MEDICATION Eye drop 1 drop each eye daily.    [name] triamcinolone (NASACORT ALLERGY 24HR) 55 MCG/ACT AERO nasal inhaler Place 2 sprays into the nose daily.    [name] umeclidinium bromide (INCRUSE ELLIPTA) 62.5 MCG/INH AEPB Inhale 1 puff into the lungs daily. 30 each 11  . valACYclovir (VALTREX) 500 MG tablet TAKE 1 TABLET (500 MG TOTAL) BY MOUTH DAILY. 90 tablet 1   No current facility-administered medications for this encounter.     Allergies  Allergen Reactions  . Lipitor [Atorvastatin]     Myalgias   . Tetanus Toxoids Itching    Social History   Social History  . Marital status: Divorced    Spouse name: N/A  . Number of children: N/A  . Years of education: N/A   Occupational History  . Not on file.   Social History Main Topics  . Smoking status: Former Smoker    Packs/day: 2.00    Years: 35.00    Types: Cigarettes    Quit date: 11/19/2013  . Smokeless tobacco: Never Used  . Alcohol use 0.0 oz/week     Comment: social  . Drug use: No  . Sexual activity: Not on file   Other Topics Concern  . Not on file   Social History Narrative   Divorced   No family   Works at [employer] more   Smokes 1.5 ppd   Sedentary   Stress in life    Family History  Problem Relation Age of Onset  . Heart failure Mother   . Hypertension Mother   . Hyperlipidemia Mother   . Heart disease Mother   . Liver disease Father   . Alcohol abuse Father    The patient does not have a history of early familial atrial fibrillation or other arrhythmias.  ROS- All systems  are reviewed and negative except as per the HPI above.  Physical Exam: Vitals:   10/27/16 1555  BP: (!) 166/88  Pulse: 76  Weight: 149 lb 9.6 oz (67.9 kg)  Height: 5\' 3"  (1.6 m)    GEN- The patient is well appearing, alert and oriented x 3 today.   Head- normocephalic, atraumatic Eyes-  Sclera clear, conjunctiva pink Ears- hearing intact Oropharynx- clear Neck- supple  Lungs- Clear to ausculation bilaterally, normal work of breathing Heart- Regular rate and rhythm, no murmurs, rubs  or gallops  GI- soft, NT, ND, + BS Extremities- no clubbing, cyanosis, or edema MS- no significant deformity or atrophy Skin- no rash or lesion Psych- euthymic mood, full affect Neuro- strength and sensation are intact  Wt Readings from Last 3 Encounters:  10/27/16 149 lb 9.6 oz (67.9 kg)  09/25/16 155 lb (70.3 kg)  09/10/16 153 lb (69.4 kg)    EKG today demonstrates sinus rhythm, rate 76, RBBB, LAFB Echo 09/2016 demonstrated normal LV function, mild AS, LA 39  Epic records are reviewed at length today  Assessment and Plan:  1. Paroxysmal  atrial fibrillation The patient has paroxysmal atrial fibrillation, but it is not clear to me how symptomatic she is as her event monitor had symptom activations while in SR and a low burden of AF. Would continue Cardizem for now [place] is 1 until next birthday in July - we discussed need to start [facility] at that time We have again discussed lifestyle modifications today - weight loss, aerobic activity, limiting ETOH consumption, consideration of sleep study  We discussed that treatment of atrial fibrillation (rate vs rhythm control) is mostly driven by symptoms. Without clear evidence that she has symptomatic AF, I would probably defer AAD therapy.   2. Obesity  As above, lifestyle modification was discussed at length including regular exercise and weight reduction.  3. Elevated blood pressure Blood pressure is elevated today in the office but has been  normal at all other recent visits I have asked her to get a blood pressure cuff for home to monitor If she has persistently elevated blood pressure, would likely need additional meds and earlier anticoagulation. I have asked her to bring log to Dr Kyla Balzarine next appt.   She would prefer to follow up in [facility] office going forward 2/2 cost concerns.  I have told her that the AF clinic is available to her if needed in the future.    [name], NP 10/27/2016 5:16 PM

## 2016-10-27 NOTE — Telephone Encounter (Signed)
I do not see that we called her  LMTCB

## 2016-10-28 NOTE — Telephone Encounter (Signed)
Called and spoke to pt. Pt is returning Leigh's call for the lung cancer screening program, there is already a referral encounter where there is documentation for the call from [place]. Will forward to the lung nodule pool. Pt questioning how much this would cost if she qualifies.

## 2016-10-29 ENCOUNTER — Encounter: Payer: Self-pay | Admitting: *Deleted

## 2016-10-30 ENCOUNTER — Telehealth (HOSPITAL_COMMUNITY): Payer: Self-pay | Admitting: Cardiovascular Disease

## 2016-10-30 NOTE — Telephone Encounter (Signed)
On 12/21 appt was cancelled   Provider (not needed) per Rushie Goltz

## 2016-10-31 ENCOUNTER — Other Ambulatory Visit: Payer: Self-pay | Admitting: Internal Medicine

## 2016-10-31 ENCOUNTER — Encounter: Payer: Self-pay | Admitting: Internal Medicine

## 2016-10-31 MED ORDER — PREDNISONE 20 MG PO TABS: ORAL_TABLET | ORAL | 0 refills | Status: DC

## 2016-10-31 MED ORDER — LEVOFLOXACIN 750 MG PO TABS: 750.0000 mg | ORAL_TABLET | Freq: Every day | ORAL | 0 refills | Status: DC

## 2016-11-03 ENCOUNTER — Ambulatory Visit: Payer: BC Managed Care – PPO | Admitting: Pulmonary Disease

## 2016-11-10 ENCOUNTER — Ambulatory Visit (HOSPITAL_COMMUNITY)
Admission: RE | Admit: 2016-11-10 | Discharge: 2016-11-10 | Disposition: A | Discharge status: Home / Self Care | Payer: BC Managed Care – PPO | Source: Ambulatory Visit | Attending: Internal Medicine | Admitting: Internal Medicine

## 2016-11-10 ENCOUNTER — Ambulatory Visit (INDEPENDENT_AMBULATORY_CARE_PROVIDER_SITE_OTHER): Payer: BC Managed Care – PPO | Admitting: Internal Medicine

## 2016-11-10 ENCOUNTER — Other Ambulatory Visit: Payer: Self-pay | Admitting: Internal Medicine

## 2016-11-10 ENCOUNTER — Encounter: Payer: Self-pay | Admitting: Internal Medicine

## 2016-11-10 VITALS — BP 108/62 | HR 80 | Temp 98.2°F | Resp 18 | Ht 63.0 in | Wt 150.0 lb

## 2016-11-10 DIAGNOSIS — R7303 Prediabetes: Secondary | ICD-10-CM

## 2016-11-10 DIAGNOSIS — J449 Chronic obstructive pulmonary disease, unspecified: Secondary | ICD-10-CM | POA: Diagnosis not present

## 2016-11-10 DIAGNOSIS — R05 Cough: Secondary | ICD-10-CM | POA: Insufficient documentation

## 2016-11-10 DIAGNOSIS — J4541 Moderate persistent asthma with (acute) exacerbation: Secondary | ICD-10-CM | POA: Diagnosis not present

## 2016-11-10 DIAGNOSIS — E782 Mixed hyperlipidemia: Secondary | ICD-10-CM

## 2016-11-10 DIAGNOSIS — Z79899 Other long term (current) drug therapy: Secondary | ICD-10-CM

## 2016-11-10 DIAGNOSIS — J069 Acute upper respiratory infection, unspecified: Secondary | ICD-10-CM | POA: Insufficient documentation

## 2016-11-10 DIAGNOSIS — E559 Vitamin D deficiency, unspecified: Secondary | ICD-10-CM | POA: Diagnosis not present

## 2016-11-10 DIAGNOSIS — E039 Hypothyroidism, unspecified: Secondary | ICD-10-CM

## 2016-11-10 LAB — POCT INFLUENZA A/B
INFLUENZA A, POC: NEGATIVE
INFLUENZA B, POC: NEGATIVE

## 2016-11-10 NOTE — Progress Notes (Signed)
HPI  Patient presents to the office for reevaluation of cough.  So far she has finished two rounds of prednisone and two antibiotics.  She reports that she did start running a fever again.  She was sent in levaquin as the schedule was full.  She reports that she did take some time off of work.  She has been working every day.  She reports that she just feels really fatigued and rundown. She reports that last week she finished the second round of antibiotics.  She reports that she has been having wheezing still and feels like her throat is still tight.  She has seen Dr. [name] and at one point in time she was put on trelogy.  She had PFTs and was diagnosed with asthma.  She reports that her insurance wouldn't cover this.  So now she is on advair and is on the incruse.  She is doing advair twice daily and is using incruse once daily.  She is using her albuterol at least twice daily.  She reports that she just feels mainly congested.  She is still doing mucinex, zyrtec, nasacort and mucinex.    She is overdue for her laboratory testing at a 6 month visit.  She has not changed diet much.  Blood pressure has been staying very stable.  She reports that she is still trying to get exercise in.  She is taking her anxiety and depression medications.  She feels that this is stable.  She is also taking her thyroid medication on an empty stomach.     Review of Systems  Constitutional: Positive for fever and malaise/fatigue. Negative for chills.  HENT: Positive for congestion and sore throat. Negative for hearing loss and sinus pain.   Respiratory: Positive for cough, shortness of breath and wheezing. Negative for sputum production.   Cardiovascular: Negative for chest pain, palpitations and leg swelling.  Gastrointestinal: Negative.   Genitourinary: Negative.   Musculoskeletal: Negative.   Skin: Negative.   Neurological: Negative.   Psychiatric/Behavioral: Negative.     PE:  Vitals:   11/10/16 1009  BP:  108/62  Pulse: 80  Resp: 18  Temp: 98.2 F (36.8 C)    General:  Alert and non-toxic, WDWN, NAD HEENT: NCAT, PERLA, EOM normal, no occular discharge or erythema.  Nasal mucosal edema with sinus tenderness to palpation.  Oropharynx clear with minimal oropharyngeal edema and erythema.  Mucous membranes moist and pink. Neck:  Cervical adenopathy Chest:  RRR no MRGs.  Lungs clear to auscultation A&P with no wheezes rhonchi or rales.   Abdomen: +BS x 4 quadrants, soft, non-tender, no guarding, rigidity, or rebound. Skin: warm and dry no rash Neuro: A&Ox4, CN II-XII grossly intact  Assessment and Plan:   1. Hypothyroidism, unspecified type -levothyroxine -dose adjust medication if necessary - TSH  2. Hyperlipidemia -cont medication -cont diet and exercise - Lipid panel  3. Prediabetes -cont diet and exercise - Hemoglobin A1c  4. Vitamin D deficiency -cont Vit D supplement  5. Medication management  - CBC with Differential/Platelet - BASIC METABOLIC PANEL WITH GFR - Hepatic function panel  6. Moderate persistent asthma with acute exacerbation -suspect that this is likely viral in nature and clinically does appear improved -will get RSV panel and rapid flu in office is negative -will not proceed with any more abx unless CXR is positive for PNA -increase albuterol to q6hrs prn -will try different cough medication.  -cont mucinex, nasacort, zyrtec, and nasal saline - Influenza AB and RSV  RNA,QL RT-PCR - DG Chest 2 View; Future

## 2016-11-10 NOTE — Patient Instructions (Signed)
Please start using your albuterol every 6 hours as needed for wheezing and shortness of breath.   Please continue the prednisone until it is gone completely.    I have tested you for several viruses today to see whether this could be RSV or you recovering from the flu.  Please continue the mucinex, incruse, advair, zyrtec, singulair, and nasacort.  Please use saline or vaseline in the interior of your nose to help prevent any nose bleeding.  Please continue to rinse your nose with saline spray.  [facility]  If you need [place] to fill out forms for work please send the FMLA papers to the office.

## 2016-11-11 ENCOUNTER — Encounter (HOSPITAL_BASED_OUTPATIENT_CLINIC_OR_DEPARTMENT_OTHER): Payer: BC Managed Care – PPO

## 2016-11-11 ENCOUNTER — Encounter: Payer: Self-pay | Admitting: Internal Medicine

## 2016-11-11 ENCOUNTER — Ambulatory Visit: Payer: Self-pay | Admitting: Internal Medicine

## 2016-11-11 ENCOUNTER — Other Ambulatory Visit: Payer: Self-pay | Admitting: Internal Medicine

## 2016-11-11 LAB — HEPATIC FUNCTION PANEL
ALK PHOS: 70 U/L (ref 33–130)
ALT: 23 U/L (ref 6–29)
AST: 13 U/L (ref 10–35)
Albumin: 3.6 g/dL (ref 3.6–5.1)
BILIRUBIN DIRECT: 0.1 mg/dL (ref <=0.2)
BILIRUBIN INDIRECT: 0.6 mg/dL (ref 0.2–1.2)
BILIRUBIN TOTAL: 0.7 mg/dL (ref 0.2–1.2)
Total Protein: 6 g/dL — ABNORMAL LOW (ref 6.1–8.1)

## 2016-11-11 LAB — CBC WITH DIFFERENTIAL/PLATELET
BASOS PCT: 0 %
Basophils Absolute: 0 cells/uL (ref 0–200)
EOS ABS: 232 {cells}/uL (ref 15–500)
EOS PCT: 2 %
HCT: 40.7 % (ref 35.0–45.0)
Hemoglobin: 13.2 g/dL (ref 11.7–15.5)
Lymphocytes Relative: 11 %
Lymphs Abs: 1276 cells/uL (ref 850–3900)
MCH: 32.3 pg (ref 27.0–33.0)
MCHC: 32.4 g/dL (ref 32.0–36.0)
MCV: 99.5 fL (ref 80.0–100.0)
MONOS PCT: 5 %
MPV: 9.8 fL (ref 7.5–12.5)
Monocytes Absolute: 580 cells/uL (ref 200–950)
NEUTROS ABS: 9512 {cells}/uL — AB (ref 1500–7800)
Neutrophils Relative %: 82 %
PLATELETS: 220 10*3/uL (ref 140–400)
RBC: 4.09 MIL/uL (ref 3.80–5.10)
RDW: 13.8 % (ref 11.0–15.0)
WBC: 11.6 10*3/uL — ABNORMAL HIGH (ref 3.8–10.8)

## 2016-11-11 LAB — LIPID PANEL
CHOL/HDL RATIO: 3.5 ratio (ref <5.0)
Cholesterol: 182 mg/dL (ref <200)
HDL: 52 mg/dL (ref >50)
LDL Cholesterol: 76 mg/dL (ref <100)
Triglycerides: 270 mg/dL — ABNORMAL HIGH (ref <150)
VLDL: 54 mg/dL — AB (ref <30)

## 2016-11-11 LAB — BASIC METABOLIC PANEL WITH GFR
BUN: 23 mg/dL (ref 7–25)
CHLORIDE: 97 mmol/L — AB (ref 98–110)
CO2: 27 mmol/L (ref 20–31)
Calcium: 9.6 mg/dL (ref 8.6–10.4)
Creat: 0.85 mg/dL (ref 0.50–0.99)
GFR, Est African American: 84 mL/min (ref >=60)
GFR, Est Non African American: 73 mL/min (ref >=60)
Glucose, Bld: 314 mg/dL — ABNORMAL HIGH (ref 65–99)
POTASSIUM: 4.6 mmol/L (ref 3.5–5.3)
SODIUM: 135 mmol/L (ref 135–146)

## 2016-11-11 LAB — HEMOGLOBIN A1C
HEMOGLOBIN A1C: 7.8 % — AB (ref <5.7)
MEAN PLASMA GLUCOSE: 177 mg/dL

## 2016-11-11 LAB — TSH: TSH: 0.94 mIU/L

## 2016-11-11 MED ORDER — METFORMIN HCL ER 500 MG PO TB24
ORAL_TABLET | ORAL | 3 refills | Status: DC
Start: 2016-11-11 — End: 2016-12-03

## 2016-11-12 ENCOUNTER — Other Ambulatory Visit: Payer: Self-pay | Admitting: Internal Medicine

## 2016-11-12 LAB — INFLUENZA AB AND RSV RNA,QL RT-PCR

## 2016-11-12 MED ORDER — DOXYCYCLINE HYCLATE 100 MG PO CAPS: 100.0000 mg | ORAL_CAPSULE | Freq: Two times a day (BID) | ORAL | 0 refills | Status: DC

## 2016-11-13 ENCOUNTER — Encounter: Payer: Self-pay | Admitting: Internal Medicine

## 2016-11-14 ENCOUNTER — Telehealth: Payer: Self-pay | Admitting: Internal Medicine

## 2016-11-14 MED ORDER — PROMETHAZINE-CODEINE 6.25-10 MG/5ML PO SYRP: 5.0000 mL | ORAL_SOLUTION | Freq: Four times a day (QID) | ORAL | 0 refills | Status: DC | PRN

## 2016-11-14 MED ORDER — DOXYCYCLINE HYCLATE 100 MG PO CAPS: 100.0000 mg | ORAL_CAPSULE | Freq: Two times a day (BID) | ORAL | 0 refills | Status: DC

## 2016-11-14 NOTE — Telephone Encounter (Signed)
LMTC x 1  

## 2016-11-14 NOTE — Telephone Encounter (Signed)
Patient reporting that she did not get her new antibiotic doxycycline.  She is also requesting a new cough medication.  Will send the doxycycline to the [facility] and will have phenergan codeine called in.  Patient aware form my mychart message that her IgG for her influenza will not be back for 5 more days.

## 2016-11-17 ENCOUNTER — Other Ambulatory Visit: Payer: Self-pay | Admitting: Physician Assistant

## 2016-11-17 ENCOUNTER — Telehealth: Payer: Self-pay | Admitting: Acute Care

## 2016-11-17 LAB — INFLUENZA A ANTIBODY, IGG: Influenza A Ab: 1:32 {titer} — ABNORMAL HIGH

## 2016-11-17 LAB — INFLUENZA B ANTIBODY: Influenza B Ab: 1:16 {titer} — ABNORMAL HIGH

## 2016-11-18 ENCOUNTER — Encounter: Payer: Self-pay | Admitting: Internal Medicine

## 2016-11-18 NOTE — Telephone Encounter (Signed)
Will forward to lung cancer screening pool.

## 2016-11-18 NOTE — Telephone Encounter (Signed)
Pt returning call for lung screening and can be reached [phone].Kathryn Booker

## 2016-11-19 NOTE — Telephone Encounter (Signed)
Spoke with pt. Pt is going to contact [place] to see how they will cover SDMV and CT and will call us back to schedule.  Will close this note due to notes also being in referral notes.

## 2016-11-19 NOTE — Telephone Encounter (Signed)
LMTC x `1 for pt 

## 2016-11-19 NOTE — Telephone Encounter (Signed)
Will close this message.  Please see telephone note 11/17/16.

## 2016-11-20 ENCOUNTER — Telehealth: Payer: Self-pay | Admitting: Acute Care

## 2016-11-20 DIAGNOSIS — Z87891 Personal history of nicotine dependence: Secondary | ICD-10-CM

## 2016-11-20 NOTE — Progress Notes (Signed)
Primary Care Physician: Vicie Mutters, PA-C Primary Cardiologist: Kinue Gudeman is a 66 y.o. female with a history of paroxysmal atrial fibrillation last seen by afib clinic in January 2018  The patient was initially diagnosed with atrial fibrillation 09/2016 after presenting with symptoms of palpitations.  She wore an event monitor which interestingly showed paroxysmal atrial fibrillation but also periods of palpitations where the patient was in SR.  She was started on Cardizem at last visit with AF clinic  She has had fewer palpitations while on Cardizem.   She cancelled her sleep study as she felt that it would be prohibitively expensive and she does not have anyone to watch her animals for the night. In review of OSA symptoms, she has a long history of snoring but wakes up refreshed and does not fall asleep easily during the day.  We discussed the option of a home sleep study, but she does not want to pursue at this time.   Today, she denies symptoms of chest pain, shortness of breath, orthopnea, PND, lower extremity edema, dizziness, presyncope, syncope, snoring, daytime somnolence, bleeding, or neurologic sequela. The patient is tolerating medications without difficulties and is otherwise without complaint today.   Showed her how to take pulse radially and brachially. Also discussed risks and benefits of starting xarelto In July when she turns 89  My own bias is to wait until she has another documented episode of PAF    Atrial Fibrillation Risk Factors:  she does not have symptoms or diagnosis of sleep apnea.  she does not have a history of rheumatic fever.  she does have a history of alcohol use.  she has a BMI of Body mass index is 26.04 kg/m.[name] Filed Weights   11/28/16 1546  Weight: 147 lb (66.7 kg)    LA size: 39   Atrial Fibrillation Management history:  Previous antiarrhythmic drugs: none  Previous cardioversions: none  Previous ablations:  none  CHADS2VASC score: 1  Anticoagulation history: none   Past Medical History:  Diagnosis Date  . Anxiety   . Asthma   . COPD (chronic obstructive pulmonary disease) ([place])   . Depression   . Elevated cholesterol   . Fatigue   . RBBB (right bundle branch block with left anterior fascicular block)   . Scoliosis   . Serum calcium elevated   . Thyroid disease    Past Surgical History:  Procedure Laterality Date  . APPENDECTOMY    . LAPROSCOPIC     X 2 IN EARLY 30-40'S  . OVARIAN CYST REMOVAL    . RADIOACTIVE SEED GUIDED EXCISIONAL BREAST BIOPSY Right 05/15/2016   Procedure: RIGHT RADIOACTIVE SEED GUIDED EXCISIONAL BREAST BIOPSY;  Surgeon: Rolm Bookbinder, MD;  Location: [place] [facility];  Service: General;  Laterality: Right;  RIGHT RADIOACTIVE SEED GUIDED EXCISIONAL BREAST BIOPSY  . TONSILECTOMY, ADENOIDECTOMY, BILATERAL MYRINGOTOMY AND TUBES      Current Outpatient Prescriptions  Medication Sig Dispense Refill  . ADVAIR DISKUS 250-50 MCG/DOSE AEPB INHALE 1 INHALATION BY MOUTH 2 TIMES DAILY, RINSE MOUTH AFTER INHALATION 180 each 2  . ALPRAZolam (XANAX) 0.5 MG tablet Take 1 tablet (0.5 mg total) by mouth 2 (two) times daily as needed for anxiety. 60 tablet 0  . aspirin EC 325 MG tablet Take 1 tablet (325 mg total) by mouth daily.    [name] buPROPion (WELLBUTRIN XL) 300 MG 24 hr tablet TAKE 1 TABLET BY MOUTH EVERY DAY 90 tablet 0  . Cholecalciferol (VITAMIN D  PO) Take 1,000 Units by mouth.    . citalopram (CELEXA) 40 MG tablet TAKE 1 TABLET BY MOUTH EVERY DAY 90 tablet 1  . cyclobenzaprine (FLEXERIL) 10 MG tablet Take 1 tablet (10 mg total) by mouth at bedtime. 90 tablet 1  . diltiazem (CARDIZEM CD) 120 MG 24 hr capsule Take 1 capsule (120 mg total) by mouth daily. 30 capsule 6  . ezetimibe (ZETIA) 10 MG tablet TAKE 1 TABLET BY MOUTH ONCE DAILY 90 tablet 0  . guaiFENesin (MUCINEX) 600 MG 12 hr tablet Take 600 mg by mouth 2 (two) times daily.     . Magnesium 250 MG TABS  Take 1 tablet by mouth daily.     . metFORMIN (GLUCOPHAGE XR) 500 MG 24 hr tablet Take 1 tablet with breakfast, take 1 tablet with lunch, and take 2 tablets with dinner. 360 tablet 3  . montelukast (SINGULAIR) 10 MG tablet Take 1 tablet (10 mg total) by mouth at bedtime. 30 tablet 11  . Multiple Vitamins-Minerals (MULTIVITAMIN WITH MINERALS) tablet Take 1 tablet by mouth daily.    . Omega-3 Fatty Acids (FISH OIL) 1000 MG CAPS Take 1 capsule by mouth daily.     [name] OVER THE COUNTER MEDICATION Eye drop 1 drop each eye daily.    [name] PROAIR HFA 108 (90 Base) MCG/ACT inhaler INHALE 2 PUFFS INTO THE LUNGS EVERY 6 HOURS AS NEEDED FOR WHEEZING OR SHORTNESS OF BREATH. 3 Inhaler 1  . triamcinolone (NASACORT ALLERGY 24HR) 55 MCG/ACT AERO nasal inhaler Place 2 sprays into the nose daily.    [name] umeclidinium bromide (INCRUSE ELLIPTA) 62.5 MCG/INH AEPB Inhale 1 puff into the lungs daily. 30 each 11  . valACYclovir (VALTREX) 500 MG tablet TAKE 1 TABLET BY MOUTH DAILY 90 tablet 1   No current facility-administered medications for this visit.     Allergies  Allergen Reactions  . Lipitor [Atorvastatin]     Myalgias   . Tetanus Toxoids Itching    Social History   Social History  . Marital status: Divorced    Spouse name: N/A  . Number of children: N/A  . Years of education: N/A   Occupational History  . Not on file.   Social History Main Topics  . Smoking status: Former Smoker    Packs/day: 2.00    Years: 35.00    Types: Cigarettes    Quit date: 11/19/2013  . Smokeless tobacco: Never Used  . Alcohol use 0.0 oz/week     Comment: social  . Drug use: No  . Sexual activity: Not on file   Other Topics Concern  . Not on file   Social History Narrative   Divorced   No family   Works at [employer] more   Smokes 1.5 ppd   Sedentary   Stress in life    Family History  Problem Relation Age of Onset  . Heart failure Mother   . Hypertension Mother   . Hyperlipidemia Mother    . Heart disease Mother   . Liver disease Father   . Alcohol abuse Father    The patient does not have a history of early familial atrial fibrillation or other arrhythmias.  ROS- All systems are reviewed and negative except as per the HPI above.  Physical Exam: Vitals:   11/28/16 1546  BP: 120/60  Pulse: 73  SpO2: 97%  Weight: 147 lb (66.7 kg)  Height: 5\' 3"  (1.6 m)    GEN- The patient is well appearing,  alert and oriented x 3 today.   Head- normocephalic, atraumatic Eyes-  Sclera clear, conjunctiva pink Ears- hearing intact Oropharynx- clear Neck- supple  Lungs- Clear to ausculation bilaterally, normal work of breathing Heart- Regular rate and rhythm, no murmurs, rubs or gallops  GI- soft, NT, ND, + BS Extremities- no clubbing, cyanosis, or edema MS- no significant deformity or atrophy Skin- no rash or lesion Psych- euthymic mood, full affect Neuro- strength and sensation are intact  Wt Readings from Last 3 Encounters:  11/28/16 147 lb (66.7 kg)  11/10/16 150 lb (68 kg)  10/27/16 149 lb 9.6 oz (67.9 kg)    EKG today demonstrates sinus rhythm, rate 76, RBBB, LAFB Echo 09/2016 demonstrated normal LV function, mild AS, LA 39  Epic records are reviewed at length today  Assessment and Plan:  1. Paroxysmal  atrial fibrillation The patient has paroxysmal atrial fibrillation, not always correlative with symptoms CHADS2VASC is 1 until next birthday in July - we discussed need to start [facility] at that time We have again discussed lifestyle modifications today - weight loss, aerobic activity, limiting ETOH consumption, consideration of sleep study  We discussed that treatment of atrial fibrillation (rate vs rhythm control) is mostly driven by symptoms. Without clear evidence that she has symptomatic AF, I would probably defer AAD therapy.   2. Obesity  As above, lifestyle modification was discussed at length including regular exercise and weight reduction.  3. Elevated blood  pressure Well controlled.  Continue current medications and low sodium Dash type diet.    4. DM:  Discussed low carb diet.  Target hemoglobin A1c is 6.5 or less.  Continue current medications.  5. Cholesterol: on zetia labs with primary     Jenkins Rouge

## 2016-11-21 ENCOUNTER — Other Ambulatory Visit: Payer: Self-pay | Admitting: Physician Assistant

## 2016-11-21 ENCOUNTER — Encounter: Payer: Self-pay | Admitting: Cardiovascular Disease

## 2016-11-21 DIAGNOSIS — Z122 Encounter for screening for malignant neoplasm of respiratory organs: Secondary | ICD-10-CM

## 2016-11-21 NOTE — Telephone Encounter (Signed)
Spoke with pt and scheduled for [facility] 12/05/16 at 4:00 CT ordered Nothing further needed

## 2016-11-23 ENCOUNTER — Other Ambulatory Visit: Payer: Self-pay | Admitting: Internal Medicine

## 2016-11-25 ENCOUNTER — Other Ambulatory Visit: Payer: Self-pay | Admitting: Internal Medicine

## 2016-11-28 ENCOUNTER — Encounter: Payer: Self-pay | Admitting: Cardiovascular Disease

## 2016-11-28 ENCOUNTER — Ambulatory Visit (INDEPENDENT_AMBULATORY_CARE_PROVIDER_SITE_OTHER): Payer: BC Managed Care – PPO | Admitting: Cardiovascular Disease

## 2016-11-28 VITALS — BP 120/60 | HR 73 | Ht 63.0 in | Wt 147.0 lb

## 2016-11-28 DIAGNOSIS — I481 Persistent atrial fibrillation: Secondary | ICD-10-CM

## 2016-11-28 DIAGNOSIS — I4819 Other persistent atrial fibrillation: Secondary | ICD-10-CM

## 2016-11-28 NOTE — Patient Instructions (Addendum)

## 2016-12-03 ENCOUNTER — Other Ambulatory Visit (INDEPENDENT_AMBULATORY_CARE_PROVIDER_SITE_OTHER): Payer: BC Managed Care – PPO

## 2016-12-03 ENCOUNTER — Encounter: Payer: Self-pay | Admitting: Physician Assistant

## 2016-12-03 ENCOUNTER — Encounter: Payer: Self-pay | Admitting: Pulmonary Disease

## 2016-12-03 ENCOUNTER — Ambulatory Visit (INDEPENDENT_AMBULATORY_CARE_PROVIDER_SITE_OTHER): Payer: BC Managed Care – PPO | Admitting: Pulmonary Disease

## 2016-12-03 VITALS — BP 130/72 | HR 60 | Ht 63.0 in | Wt 142.0 lb

## 2016-12-03 DIAGNOSIS — J45909 Unspecified asthma, uncomplicated: Secondary | ICD-10-CM

## 2016-12-03 DIAGNOSIS — J301 Allergic rhinitis due to pollen: Secondary | ICD-10-CM

## 2016-12-03 DIAGNOSIS — J309 Allergic rhinitis, unspecified: Secondary | ICD-10-CM | POA: Insufficient documentation

## 2016-12-03 DIAGNOSIS — J329 Chronic sinusitis, unspecified: Secondary | ICD-10-CM | POA: Diagnosis not present

## 2016-12-03 LAB — CBC WITH DIFFERENTIAL/PLATELET
BASOS PCT: 0.7 % (ref 0.0–3.0)
Basophils Absolute: 0.1 10*3/uL (ref 0.0–0.1)
EOS PCT: 2.1 % (ref 0.0–5.0)
Eosinophils Absolute: 0.2 10*3/uL (ref 0.0–0.7)
HEMATOCRIT: 39.9 % (ref 36.0–46.0)
HEMOGLOBIN: 13.3 g/dL (ref 12.0–15.0)
LYMPHS PCT: 37.2 % (ref 12.0–46.0)
Lymphs Abs: 2.8 10*3/uL (ref 0.7–4.0)
MCHC: 33.3 g/dL (ref 30.0–36.0)
MCV: 96.3 fl (ref 78.0–100.0)
Monocytes Absolute: 0.8 10*3/uL (ref 0.1–1.0)
Monocytes Relative: 10.1 % (ref 3.0–12.0)
NEUTROS ABS: 3.8 10*3/uL (ref 1.4–7.7)
Neutrophils Relative %: 49.9 % (ref 43.0–77.0)
PLATELETS: 273 10*3/uL (ref 150.0–400.0)
RBC: 4.14 Mil/uL (ref 3.87–5.11)
RDW: 14.3 % (ref 11.5–15.5)
WBC: 7.6 10*3/uL (ref 4.0–10.5)

## 2016-12-03 NOTE — Patient Instructions (Signed)
For your sinusitis: Keep taking Nasacort 2 puffs each nostril daily Keep taking Singulair Keep taking Zyrtec, generic okay We will check a CT scan of your sinuses  For your asthma: Keep taking Advair twice a day Keep taking Incruise once daily We will call you with the results of today's blood work We may need to consider treatment with an injectable therapy to help get better control  We will see you back in 2-4 weeks or sooner if needed

## 2016-12-03 NOTE — Assessment & Plan Note (Signed)
Kathryn Booker clearly has poorly controlled asthma. Her lung function testing shows airflow obstruction, air-trapping but normal diffusion capacity. She has a lifelong history of asthma.  Today I spent an extensive amount of time explaining to her that her persistent postnasal drip, significant shortness of breath, cough, and air-trapping seen on lung function testing all consistent with this.  Poorly controlled asthma can be exacerbated by allergic rhinitis, acid reflux. She tells me she's not smoking anymore but I can also cause recurrent exacerbations as well.  Today I advised allergy consultation and consideration for repeat immunotherapy, she turned this down citing concerns of cost.  She is on maximal inhaled medical therapy with Incruise and Advair.  Plan: Continue current inhaled therapy Check CBC with differential to look for eosinophils Check serum IgE If blood work is unrevealing then I strongly recommend she be seen by allergy

## 2016-12-03 NOTE — Assessment & Plan Note (Signed)
Continue Flonase, Singulair, and Zyrtec.  Workup as above  May need allergy involvement

## 2016-12-03 NOTE — Progress Notes (Signed)
Subjective:    Patient ID: Kathryn Booker, female    DOB: 21-Jun-1951, 66 y.o.   MRN: [id number]  Synopsis: First evaluated in December 2017 by [facility] pulmonary after heavy smoking history through 2015. She has a history of childhood asthma and has an adult took immunotherapy for 20 years.   HPI Chief Complaint  Patient presents with  . Follow-up    pt c/o pnd, prod cough.    Kathryn Booker says that her symptoms have been worsening lately. She had flu in January. Overall she's not improved.  Cough: > form a post nasal drip all the time > worse when she had influenza A in January  Influenza A in January > diagnosed in January > three rounds of antibiotics > guaifenesin helped  Asthma: > added Incruise, didn't help with her chest congestion and cough > she says that she can feel mucus in her chest, she just can't get it out  Past Medical History:  Diagnosis Date  . Anxiety   . Asthma   . COPD (chronic obstructive pulmonary disease) ([place])   . Depression   . Elevated cholesterol   . Fatigue   . RBBB (right bundle branch block with left anterior fascicular block)   . Scoliosis   . Serum calcium elevated   . Thyroid disease      Review of Systems  Constitutional: Positive for fatigue. Negative for chills and fever.  HENT: Positive for postnasal drip and sinus pressure. Negative for sore throat.   Respiratory: Positive for cough, shortness of breath and wheezing.   Cardiovascular: Negative for chest pain, palpitations and leg swelling.       Objective:   Physical Exam Vitals:   12/03/16 1632  BP: 130/72  Pulse: 60  SpO2: 98%  Weight: 142 lb (64.4 kg)  Height: 5\' 3"  (1.6 m)   RA  Gen: well appearing HENT: OP clear, TM's clear, neck supple PULM: Barrel chest, some wheezing RUL, normal percussion CV: RRR, no mgr, trace edema GI: BS+, soft, nontender Derm: no cyanosis or rash Psyche: normal mood and affect    Lung function testing: December 2017 full  pulmonary function test, normal ratio but flow volume loop consistent with airflow obstruction, particularly small airways disease. FEV1 2.02 L 86% predicted, FVC 2.74 L 89% predicted, total lung capacity 8.18 L 166% predicted, residual volume 5.62 L 276% predicted, DLCO 18.86, 82% predicted  Chest x-ray imaging from February 2018 independently reviewed with the patient and clinic today showing air-trapping, flattened diaphragms bilaterally      Assessment & Plan:  Asthma Cris clearly has poorly controlled asthma. Her lung function testing shows airflow obstruction, air-trapping but normal diffusion capacity. She has a lifelong history of asthma.  Today I spent an extensive amount of time explaining to her that her persistent postnasal drip, significant shortness of breath, cough, and air-trapping seen on lung function testing all consistent with this.  Poorly controlled asthma can be exacerbated by allergic rhinitis, acid reflux. She tells me she's not smoking anymore but I can also cause recurrent exacerbations as well.  Today I advised allergy consultation and consideration for repeat immunotherapy, she turned this down citing concerns of cost.  She is on maximal inhaled medical therapy with Incruise and Advair.  Plan: Continue current inhaled therapy Check CBC with differential to look for eosinophils Check serum IgE If blood work is unrevealing then I strongly recommend she be seen by allergy   Allergic rhinitis Continue Flonase, Singulair, and Zyrtec.  Workup as above  May need allergy involvement    Current Outpatient Prescriptions:  .  ADVAIR DISKUS 250-50 MCG/DOSE AEPB, INHALE 1 INHALATION BY MOUTH 2 TIMES DAILY, RINSE MOUTH AFTER INHALATION, Disp: 180 each, Rfl: 2 .  ALPRAZolam (XANAX) 0.5 MG tablet, Take 1 tablet (0.5 mg total) by mouth 2 (two) times daily as needed for anxiety., Disp: 60 tablet, Rfl: 0 .  aspirin EC 325 MG tablet, Take 1 tablet (325 mg total) by  mouth daily., Disp: , Rfl:  .  buPROPion (WELLBUTRIN XL) 300 MG 24 hr tablet, TAKE 1 TABLET BY MOUTH EVERY DAY, Disp: 90 tablet, Rfl: 0 .  Cholecalciferol (VITAMIN D PO), Take 1,000 Units by mouth., Disp: , Rfl:  .  citalopram (CELEXA) 40 MG tablet, TAKE 1 TABLET BY MOUTH EVERY DAY, Disp: 90 tablet, Rfl: 1 .  cyclobenzaprine (FLEXERIL) 10 MG tablet, Take 1 tablet (10 mg total) by mouth at bedtime., Disp: 90 tablet, Rfl: 1 .  diltiazem (CARDIZEM CD) 120 MG 24 hr capsule, Take 1 capsule (120 mg total) by mouth daily., Disp: 30 capsule, Rfl: 6 .  ezetimibe (ZETIA) 10 MG tablet, TAKE 1 TABLET BY MOUTH ONCE DAILY, Disp: 90 tablet, Rfl: 0 .  guaiFENesin (MUCINEX) 600 MG 12 hr tablet, Take 600 mg by mouth 2 (two) times daily. , Disp: , Rfl:  .  Magnesium 250 MG TABS, Take 1 tablet by mouth daily. , Disp: , Rfl:  .  montelukast (SINGULAIR) 10 MG tablet, Take 1 tablet (10 mg total) by mouth at bedtime., Disp: 30 tablet, Rfl: 11 .  Multiple Vitamins-Minerals (MULTIVITAMIN WITH MINERALS) tablet, Take 1 tablet by mouth daily., Disp: , Rfl:  .  Omega-3 Fatty Acids (FISH OIL) 1000 MG CAPS, Take 1 capsule by mouth daily. , Disp: , Rfl:  .  OVER THE COUNTER MEDICATION, Eye drop 1 drop each eye daily., Disp: , Rfl:  .  PROAIR HFA 108 (90 Base) MCG/ACT inhaler, INHALE 2 PUFFS INTO THE LUNGS EVERY 6 HOURS AS NEEDED FOR WHEEZING OR SHORTNESS OF BREATH., Disp: 3 Inhaler, Rfl: 1 .  triamcinolone (NASACORT ALLERGY 24HR) 55 MCG/ACT AERO nasal inhaler, Place 2 sprays into the nose daily., Disp: , Rfl:  .  umeclidinium bromide (INCRUSE ELLIPTA) 62.5 MCG/INH AEPB, Inhale 1 puff into the lungs daily., Disp: 30 each, Rfl: 11 .  valACYclovir (VALTREX) 500 MG tablet, TAKE 1 TABLET BY MOUTH DAILY, Disp: 90 tablet, Rfl: 1

## 2016-12-04 LAB — IGE: IGE (IMMUNOGLOBULIN E), SERUM: 32 kU/L (ref <115)

## 2016-12-05 ENCOUNTER — Telehealth: Payer: Self-pay | Admitting: Pulmonary Disease

## 2016-12-05 ENCOUNTER — Ambulatory Visit (INDEPENDENT_AMBULATORY_CARE_PROVIDER_SITE_OTHER)
Admission: RE | Admit: 2016-12-05 | Discharge: 2016-12-05 | Disposition: A | Discharge status: Home / Self Care | Payer: BC Managed Care – PPO | Source: Ambulatory Visit | Attending: Pulmonary Disease | Admitting: Pulmonary Disease

## 2016-12-05 ENCOUNTER — Encounter: Payer: Self-pay | Admitting: Pulmonary Disease

## 2016-12-05 ENCOUNTER — Ambulatory Visit (INDEPENDENT_AMBULATORY_CARE_PROVIDER_SITE_OTHER)
Admission: RE | Admit: 2016-12-05 | Discharge: 2016-12-05 | Disposition: A | Discharge status: Home / Self Care | Payer: BC Managed Care – PPO | Source: Ambulatory Visit | Attending: Acute Care | Admitting: Acute Care

## 2016-12-05 ENCOUNTER — Ambulatory Visit (INDEPENDENT_AMBULATORY_CARE_PROVIDER_SITE_OTHER): Payer: BC Managed Care – PPO | Admitting: Acute Care

## 2016-12-05 ENCOUNTER — Encounter: Payer: Self-pay | Admitting: Acute Care

## 2016-12-05 DIAGNOSIS — J329 Chronic sinusitis, unspecified: Secondary | ICD-10-CM | POA: Diagnosis not present

## 2016-12-05 DIAGNOSIS — Z87891 Personal history of nicotine dependence: Secondary | ICD-10-CM | POA: Diagnosis not present

## 2016-12-05 NOTE — Telephone Encounter (Signed)
Kathryn Booker ° °

## 2016-12-05 NOTE — Progress Notes (Signed)
Shared Decision Making Visit Lung Cancer Screening Program [phone])   Eligibility:  Age 66 y.o.  Pack Years Smoking History Calculation 66 pack year smoking history (# packs/per year x # years smoked)  Recent History of coughing up blood  no  Unexplained weight loss? no ( >Than 15 pounds within the last 6 months )  Prior History Lung / other cancer no (Diagnosis within the last 5 years already requiring surveillance chest CT Scans).  Smoking Status Former Smoker  Former Smokers: Years since quit: 3  Quit Date: 11/2013  Visit Components:  Discussion included one or more decision making aids. yes  Discussion included risk/benefits of screening. yes  Discussion included potential follow up diagnostic testing for abnormal scans. yes  Discussion included meaning and risk of over diagnosis. yes  Discussion included meaning and risk of False Positives. yes  Discussion included meaning of total radiation exposure. yes  Counseling Included:  Importance of adherence to annual lung cancer LDCT screening. yes  Impact of comorbidities on ability to participate in the program. yes  Ability and willingness to under diagnostic treatment. yes  Smoking Cessation Counseling:  Current Smokers:   Discussed importance of smoking cessation. yes  Information about tobacco cessation classes and interventions provided to patient. yes  Patient provided with "ticket" for LDCT Scan. yes  Symptomatic Patient. no  Counseling  Diagnosis Code: Tobacco Use Z72.0  Asymptomatic Patient yes  Counseling (Intermediate counseling: > three minutes counseling) [id number]  Former Smokers:   Discussed the importance of maintaining cigarette abstinence. yes  Diagnosis Code: Personal History of Nicotine Dependence. [id number]  Information about tobacco cessation classes and interventions provided to patient. Yes  Patient provided with "ticket" for LDCT Scan. yes  Written Order for Lung Cancer  Screening with LDCT placed in Epic. Yes (CT Chest Lung Cancer Screening Low Dose W/O CM) [id number] Z12.2-Screening of respiratory organs Z87.891-Personal history of nicotine dependence  I spent 25 minutes of face to face time with Ms. Dolinsky discussing the risks and benefits of lung cancer screening. We viewed a power point together that explained in detail the above noted topics. We took the time to pause the power point at intervals to allow for questions to be asked and answered to ensure understanding. We discussed that she had taken the single most powerful action possible to decrease her risk of developing lung cancer when she quit smoking. I counseled her to remain smoke free, and to contact me if she ever had the desire to smoke again so that I can provide resources and tools to help support the effort to remain smoke free. We discussed the time and location of the scan, and that either Doroteo Glassman, RN  or I will call with the results within  24-48 hours of receiving them. Ms. Luke has my card and contact information in the event she needs to speak with me, in addition to a copy of the power point we reviewed as a resource. She verbalized understanding of all of the above and had no further questions upon leaving the office.  I explained that there has been a high incidence of CAD noted on these scans. Ms. Yoakam is currently on Zetia for cholesterol management, and sees Dr. Johnsie Cancel , Cards. She verbalized understanding of the above.   Magdalen Spatz, NP 12/05/2016

## 2016-12-06 ENCOUNTER — Other Ambulatory Visit: Payer: Self-pay | Admitting: Internal Medicine

## 2016-12-09 ENCOUNTER — Other Ambulatory Visit: Payer: Self-pay | Admitting: Acute Care

## 2016-12-09 ENCOUNTER — Other Ambulatory Visit: Payer: BC Managed Care – PPO

## 2016-12-09 DIAGNOSIS — Z87891 Personal history of nicotine dependence: Secondary | ICD-10-CM

## 2016-12-10 ENCOUNTER — Encounter: Payer: Self-pay | Admitting: Cardiovascular Disease

## 2016-12-29 ENCOUNTER — Telehealth: Payer: Self-pay | Admitting: Acute Care

## 2016-12-29 NOTE — Telephone Encounter (Signed)
Will hold in triage to discuss with Judson Roch in the morning 12/30/16

## 2016-12-30 ENCOUNTER — Telehealth: Payer: Self-pay | Admitting: Acute Care

## 2016-12-30 NOTE — Telephone Encounter (Signed)
Spoke with Gwinda Passe at [facility], states that pt called their office upset that her lung cancer screening ct and visit were charged to insurance and she is being billed for this- per pt these were supposed to be "no charge visits".    SG/ Kathlee Nations please adviseGwinda Passe at [facility] is wanting to know they are doing something wrong by charging to insurance.  Thanks!

## 2016-12-31 NOTE — Telephone Encounter (Signed)
Spoke with Kathlee Nations in our billing dept - she has already spoken with patient Will sign off

## 2017-01-07 ENCOUNTER — Telehealth: Payer: Self-pay

## 2017-01-07 ENCOUNTER — Encounter: Payer: Self-pay | Admitting: Cardiovascular Disease

## 2017-01-07 DIAGNOSIS — I480 Paroxysmal atrial fibrillation: Secondary | ICD-10-CM

## 2017-01-07 NOTE — Telephone Encounter (Signed)
Will forward to [name] to help with setting up study.

## 2017-01-07 NOTE — Telephone Encounter (Signed)
-----   Message from Josue Hector, MD sent at 01/07/2017 12:06 PM EDT ----- Please arrange home sleep study for patient OSA and PAF

## 2017-01-12 ENCOUNTER — Encounter: Payer: Self-pay | Admitting: *Deleted

## 2017-01-13 NOTE — Telephone Encounter (Signed)
Having trouble scheduling patient's home sleep study. I need more information (HTN, WITNESSED APNEIC EVENTS) and or an epworth score before it will let me submit it. I called the patient and left a message to call back

## 2017-01-16 NOTE — Telephone Encounter (Signed)
Called the [facility] Dr. Kyla Balzarine nurse and she was able to help me get more information to fill out the Home Sleep Study for [employer]. The patient called back and I was able to get her to complete an Epworth score sheet over the phone.   She understands the home sleep study has been submitted and she will be contacted by [facility].   She understands to call if  [employer] does not contact her in a timely manner to set up her home study.

## 2017-02-06 ENCOUNTER — Telehealth: Payer: Self-pay | Admitting: *Deleted

## 2017-02-06 NOTE — Telephone Encounter (Signed)
Late Entry:  Home sleep study was ordered on 01/20/17 and patient was notified.  Today 02/06/17: Patient called today concerning her home sleep study.  Patient understands her home sleep study has not been done because patients insurance denied it due to patients BMI not meeting  insurance requirements of over 35. Patients states she talked with her insurance and was informed that no one had called to get a pre approval. Debbie at [facility] said there is no pre approval for her insurance. The insurance requirements are preloaded in their system already and decided during the contracting phase.  Per Jackelyn Poling and [place] at [place] patient understands her BMI is 25.3 and she has the option to private pay $297.00 in 3 installments over a 60 day period.  Patient was very dissatisfied about this information and thanked me for trying to help.  Patient understands to call [employer] back with her answer to cancel the home sleep test or move forward with private pay.  Waiting for decision from [facility]

## 2017-02-06 NOTE — Telephone Encounter (Signed)
I have never heard of this Some sleep apnea is central or from soft palate issues not related to obesity Is there any way to get her sleep study covered ???  Pam- messaged sleep doctors to get their input

## 2017-02-07 NOTE — Telephone Encounter (Signed)
[employer] is the company that does home sleep studies.  Unfortunately she will have to pay - coverage is determined by her insurance.

## 2017-02-11 NOTE — Telephone Encounter (Signed)
Called [employer] to follow up on patients sleep study status. Elmyra Ricks at [facility] informed me that the patient called back and declined her sleep study due to the cost. Elmyra Ricks at [place] states if patient changes her mind they will re-open her case.

## 2017-02-13 NOTE — Progress Notes (Signed)
Assessment and Plan:   Hypertension -Continue medication, monitor blood pressure at home. Continue DASH diet.  Reminder to go to the ER if any CP, SOB, nausea, dizziness, severe HA, changes vision/speech, left arm numbness and tingling and jaw pain.  Cholesterol -Continue diet and exercise. Check cholesterol, not willing to get on livalo, willing to try zetia.    Diabetes  -Continue diet and exercise. Check A1C   Vitamin D Def - check level and continue medications.   Afib Declines sleep study, did breath right strips.  Continue cardio follow up  Continue diet and meds as discussed. Further disposition pending results of labs. Over 30 minutes of exam, counseling, chart review, and critical decision making was performed  HPI 66 y.o. female  presents for 3 month follow up on hypertension, cholesterol, prediabetes, and vitamin D deficiency.   Her blood pressure has been controlled at home, today their BP is BP: 120/74  She does not workout. She denies chest pain, shortness of breath, dizziness.  Has history of asthma/COPD, she is on ASA 325, she has history of p Afib, follows with Dr. Johnsie Cancel.   She is not on cholesterol medication, suppose to be on livalo but stopped due to memory, she has been on zetia.  Her cholesterol is at goal. The cholesterol last visit was:   Lab Results  Component Value Date   CHOL 182 11/10/2016   HDL 52 11/10/2016   LDLCALC 76 11/10/2016   TRIG 270 (H) 11/10/2016   CHOLHDL 3.5 11/10/2016    She has been working on diet and exercise for prediabetes was in DM range last visit but was on  A lot of prednisone and sick for several months, has been checking A1C at home and it has been better, and denies paresthesia of the feet, polydipsia, polyuria and visual disturbances. Last A1C in the office was:  Lab Results  Component Value Date   HGBA1C 7.8 (H) 11/10/2016   Patient is on Vitamin D supplement.   Lab Results  Component Value Date   VD25OH 60 05/02/2016       BMI is Body mass index is 25.4 kg/m., she is working on diet and exercise. Wt Readings from Last 3 Encounters:  02/17/17 143 lb 6.4 oz (65 kg)  12/03/16 142 lb (64.4 kg)  11/28/16 147 lb (66.7 kg)    Current Medications:  Current Outpatient Prescriptions on File Prior to Visit  Medication Sig Dispense Refill  . ADVAIR DISKUS 250-50 MCG/DOSE AEPB INHALE 1 INHALATION BY MOUTH 2 TIMES DAILY, RINSE MOUTH AFTER INHALATION 180 each 2  . ALPRAZolam (XANAX) 0.5 MG tablet Take 1 tablet (0.5 mg total) by mouth 2 (two) times daily as needed for anxiety. 60 tablet 0  . aspirin EC 325 MG tablet Take 1 tablet (325 mg total) by mouth daily.    [name] buPROPion (WELLBUTRIN XL) 300 MG 24 hr tablet TAKE 1 TABLET BY MOUTH EVERY DAY 90 tablet 1  . Cholecalciferol (VITAMIN D PO) Take 1,000 Units by mouth.    . citalopram (CELEXA) 40 MG tablet TAKE 1 TABLET BY MOUTH EVERY DAY 90 tablet 1  . diltiazem (CARDIZEM CD) 120 MG 24 hr capsule Take 1 capsule (120 mg total) by mouth daily. 30 capsule 6  . ezetimibe (ZETIA) 10 MG tablet TAKE 1 TABLET BY MOUTH ONCE DAILY 90 tablet 0  . guaiFENesin (MUCINEX) 600 MG 12 hr tablet Take 600 mg by mouth 2 (two) times daily.     . Magnesium 250  MG TABS Take 1 tablet by mouth daily.     . montelukast (SINGULAIR) 10 MG tablet Take 1 tablet (10 mg total) by mouth at bedtime. 30 tablet 11  . Multiple Vitamins-Minerals (MULTIVITAMIN WITH MINERALS) tablet Take 1 tablet by mouth daily.    . Omega-3 Fatty Acids (FISH OIL) 1000 MG CAPS Take 1 capsule by mouth daily.     [name] OVER THE COUNTER MEDICATION Eye drop 1 drop each eye daily.    [name] PROAIR HFA 108 (90 Base) MCG/ACT inhaler INHALE 2 PUFFS INTO THE LUNGS EVERY 6 HOURS AS NEEDED FOR WHEEZING OR SHORTNESS OF BREATH. 3 Inhaler 1  . triamcinolone (NASACORT ALLERGY 24HR) 55 MCG/ACT AERO nasal inhaler Place 2 sprays into the nose daily.    [name] umeclidinium bromide (INCRUSE ELLIPTA) 62.5 MCG/INH AEPB Inhale 1 puff into the lungs daily. 30 each  11  . valACYclovir (VALTREX) 500 MG tablet TAKE 1 TABLET BY MOUTH DAILY 90 tablet 1   No current facility-administered medications on file prior to visit.    Medical History:  Past Medical History:  Diagnosis Date  . Anxiety   . Asthma   . COPD (chronic obstructive pulmonary disease) ([place])   . Depression   . Elevated cholesterol   . Fatigue   . RBBB (right bundle branch block with left anterior fascicular block)   . Scoliosis   . Serum calcium elevated   . Thyroid disease    Allergies:  Allergies  Allergen Reactions  . Lipitor [Atorvastatin]     Myalgias   . Tetanus Toxoids Itching     Review of Systems:  Review of Systems  Constitutional: Positive for malaise/fatigue. Negative for chills, diaphoresis, fever and weight loss.  HENT: Positive for congestion. Negative for ear discharge, ear pain, hearing loss, nosebleeds, sore throat and tinnitus.        + snoring  Eyes: Negative.   Respiratory: Negative.  Negative for stridor.   Cardiovascular: Negative.   Gastrointestinal: Negative.   Genitourinary: Negative.   Musculoskeletal: Negative.   Skin: Negative.   Neurological: Negative.  Negative for weakness and headaches.  Psychiatric/Behavioral: Negative.     Family history- Review and unchanged Social history- Review and unchanged Physical Exam: BP 120/74   Pulse 78   Temp 97.5 F (36.4 C)   Resp 16   Ht 5\' 3"  (1.6 m)   Wt 143 lb 6.4 oz (65 kg)   SpO2 96%   BMI 25.40 kg/m  Wt Readings from Last 3 Encounters:  02/17/17 143 lb 6.4 oz (65 kg)  12/03/16 142 lb (64.4 kg)  11/28/16 147 lb (66.7 kg)   General Appearance: Well nourished, in no apparent distress. Eyes: PERRLA, EOMs, conjunctiva no swelling or erythema Sinuses: No Frontal/maxillary tenderness ENT/Mouth: Ext aud canals clear, TMs without erythema, bulging. No erythema, swelling, or exudate on post pharynx.  Tonsils not swollen or erythematous. Hearing normal.  Neck: Supple, thyroid normal.   Respiratory: Respiratory effort normal, BS equal bilaterally without rales, rhonchi, wheezing or stridor.  Cardio: RRR with no MRGs. Brisk peripheral pulses without edema.  Abdomen: Soft, + BS,  Non tender, no guarding, rebound, hernias, masses. Lymphatics: Non tender without lymphadenopathy.  Musculoskeletal: Full ROM, 5/5 strength, Normal gait  Warm, dry without rashes, lesions, ecchymosis.  Neuro: Cranial nerves intact. Normal muscle tone, no cerebellar symptoms. Psych: Awake and oriented X 3, normal affect, Insight and Judgment appropriate.    Vicie Mutters, PA-C 9:11 AM [facility]

## 2017-02-17 ENCOUNTER — Other Ambulatory Visit: Payer: Self-pay | Admitting: Physician Assistant

## 2017-02-17 ENCOUNTER — Ambulatory Visit (INDEPENDENT_AMBULATORY_CARE_PROVIDER_SITE_OTHER): Payer: BC Managed Care – PPO | Admitting: Physician Assistant

## 2017-02-17 ENCOUNTER — Encounter: Payer: Self-pay | Admitting: Physician Assistant

## 2017-02-17 VITALS — BP 120/74 | HR 78 | Temp 97.5°F | Resp 16 | Ht 63.0 in | Wt 143.4 lb

## 2017-02-17 DIAGNOSIS — I480 Paroxysmal atrial fibrillation: Secondary | ICD-10-CM | POA: Diagnosis not present

## 2017-02-17 DIAGNOSIS — E782 Mixed hyperlipidemia: Secondary | ICD-10-CM | POA: Diagnosis not present

## 2017-02-17 DIAGNOSIS — Z79899 Other long term (current) drug therapy: Secondary | ICD-10-CM | POA: Diagnosis not present

## 2017-02-17 DIAGNOSIS — R7303 Prediabetes: Secondary | ICD-10-CM

## 2017-02-17 DIAGNOSIS — E039 Hypothyroidism, unspecified: Secondary | ICD-10-CM

## 2017-02-17 DIAGNOSIS — F32A Depression, unspecified: Secondary | ICD-10-CM

## 2017-02-17 DIAGNOSIS — E559 Vitamin D deficiency, unspecified: Secondary | ICD-10-CM

## 2017-02-17 DIAGNOSIS — F329 Major depressive disorder, single episode, unspecified: Secondary | ICD-10-CM

## 2017-02-17 LAB — CBC WITH DIFFERENTIAL/PLATELET
Basophils Absolute: 0 cells/uL (ref 0–200)
Basophils Relative: 0 %
EOS PCT: 3 %
Eosinophils Absolute: 165 cells/uL (ref 15–500)
HCT: 43.1 % (ref 35.0–45.0)
HEMOGLOBIN: 14.3 g/dL (ref 11.7–15.5)
LYMPHS ABS: 1870 {cells}/uL (ref 850–3900)
Lymphocytes Relative: 34 %
MCH: 31.8 pg (ref 27.0–33.0)
MCHC: 33.2 g/dL (ref 32.0–36.0)
MCV: 95.8 fL (ref 80.0–100.0)
MPV: 9.7 fL (ref 7.5–12.5)
Monocytes Absolute: 440 cells/uL (ref 200–950)
Monocytes Relative: 8 %
NEUTROS PCT: 55 %
Neutro Abs: 3025 cells/uL (ref 1500–7800)
Platelets: 242 10*3/uL (ref 140–400)
RBC: 4.5 MIL/uL (ref 3.80–5.10)
RDW: 13.3 % (ref 11.0–15.0)
WBC: 5.5 10*3/uL (ref 3.8–10.8)

## 2017-02-17 LAB — BASIC METABOLIC PANEL WITH GFR
BUN: 17 mg/dL (ref 7–25)
CALCIUM: 10.6 mg/dL — AB (ref 8.6–10.4)
CO2: 24 mmol/L (ref 20–31)
Chloride: 106 mmol/L (ref 98–110)
Creat: 0.72 mg/dL (ref 0.50–0.99)
GFR, EST NON AFRICAN AMERICAN: 89 mL/min (ref >=60)
Glucose, Bld: 115 mg/dL — ABNORMAL HIGH (ref 65–99)
Potassium: 4.7 mmol/L (ref 3.5–5.3)
SODIUM: 139 mmol/L (ref 135–146)

## 2017-02-17 LAB — HEPATIC FUNCTION PANEL
ALT: 23 U/L (ref 6–29)
AST: 16 U/L (ref 10–35)
Albumin: 4.4 g/dL (ref 3.6–5.1)
Alkaline Phosphatase: 79 U/L (ref 33–130)
BILIRUBIN DIRECT: 0.1 mg/dL (ref <=0.2)
Indirect Bilirubin: 0.5 mg/dL (ref 0.2–1.2)
Total Bilirubin: 0.6 mg/dL (ref 0.2–1.2)
Total Protein: 7 g/dL (ref 6.1–8.1)

## 2017-02-17 LAB — LIPID PANEL
CHOL/HDL RATIO: 4.8 ratio (ref <5.0)
CHOLESTEROL: 197 mg/dL (ref <200)
HDL: 41 mg/dL — AB (ref >50)
LDL Cholesterol: 128 mg/dL — ABNORMAL HIGH (ref <100)
TRIGLYCERIDES: 138 mg/dL (ref <150)
VLDL: 28 mg/dL (ref <30)

## 2017-02-17 LAB — TSH: TSH: 1.95 mIU/L

## 2017-02-17 LAB — MAGNESIUM: Magnesium: 2 mg/dL (ref 1.5–2.5)

## 2017-02-17 MED ORDER — DILTIAZEM HCL ER COATED BEADS 120 MG PO CP24: 120.0000 mg | ORAL_CAPSULE | Freq: Every day | ORAL | 1 refills | Status: DC

## 2017-02-17 MED ORDER — MONTELUKAST SODIUM 10 MG PO TABS: 10.0000 mg | ORAL_TABLET | Freq: Every day | ORAL | 1 refills | Status: DC

## 2017-02-17 MED ORDER — CITALOPRAM HYDROBROMIDE 40 MG PO TABS: 40.0000 mg | ORAL_TABLET | Freq: Every day | ORAL | 1 refills | Status: DC

## 2017-02-17 MED ORDER — BUPROPION HCL ER (XL) 300 MG PO TB24: 300.0000 mg | ORAL_TABLET | Freq: Every day | ORAL | 1 refills | Status: DC

## 2017-02-17 MED ORDER — EZETIMIBE 10 MG PO TABS: 10.0000 mg | ORAL_TABLET | Freq: Every day | ORAL | 0 refills | Status: DC

## 2017-02-17 NOTE — Patient Instructions (Addendum)
Your A1C is a measure of your sugar over the past 3 months and is not affected by what you have eaten over the past few days. Diabetes increases your chances of stroke and heart attack over 300 % and is the leading cause of blindness and kidney failure in the [place]. Please make sure you decrease bad carbs like white bread, white rice, potatoes, corn, soft drinks, pasta, cereals, refined sugars, sweet tea, dried fruits, and fruit juice. Good carbs are okay to eat in moderation like sweet potatoes, brown rice, whole grain pasta/bread, most fruit (except dried fruit) and you can eat as many veggies as you want.   Greater than 6.5 is considered diabetic. Between 6.4 and 5.7 is prediabetic If your A1C is less than 5.7 you are NOT diabetic.  Targets for Glucose Readings: Time of Check Target for patients WITHOUT Diabetes Target for DIABETICS  Before Meals Less than 100  less than 150  Two hours after meals Less than 200  Less than 250       Bad carbs also include fruit juice, alcohol, and sweet tea. These are empty calories that do not signal to your brain that you are full.   Please remember the good carbs are still carbs which convert into sugar. So please measure them out no more than 1/2-1 cup of rice, oatmeal, pasta, and beans  Veggies are however free foods! Pile them on.   Not all fruit is created equal. Please see the list below, the fruit at the bottom is higher in sugars than the fruit at the top. Please avoid all dried fruits.     

## 2017-02-18 LAB — HEMOGLOBIN A1C
Hgb A1c MFr Bld: 5.9 % — ABNORMAL HIGH (ref <5.7)
Mean Plasma Glucose: 123 mg/dL

## 2017-02-18 NOTE — Telephone Encounter (Signed)
Received a fax from [facility] that patients home sleep test has been cancelled at this time reason being: "Unable to reach patient, [facility] was contacted previously and now will not return our call".

## 2017-02-26 ENCOUNTER — Encounter: Payer: Self-pay | Admitting: Physician Assistant

## 2017-02-26 MED ORDER — AZITHROMYCIN 250 MG PO TABS: ORAL_TABLET | ORAL | 1 refills | Status: AC

## 2017-02-26 MED ORDER — PROMETHAZINE-DM 6.25-15 MG/5ML PO SYRP: 5.0000 mL | ORAL_SOLUTION | Freq: Four times a day (QID) | ORAL | 1 refills | Status: DC | PRN

## 2017-02-26 MED ORDER — PREDNISONE 20 MG PO TABS: ORAL_TABLET | ORAL | 0 refills | Status: DC

## 2017-03-17 ENCOUNTER — Encounter: Payer: Self-pay | Admitting: Cardiovascular Disease

## 2017-03-23 ENCOUNTER — Telehealth: Payer: Self-pay | Admitting: Cardiovascular Disease

## 2017-03-23 DIAGNOSIS — I480 Paroxysmal atrial fibrillation: Secondary | ICD-10-CM

## 2017-03-23 MED ORDER — DILTIAZEM HCL ER COATED BEADS 240 MG PO CP24: 240.0000 mg | ORAL_CAPSULE | Freq: Every day | ORAL | 3 refills | Status: DC

## 2017-03-23 NOTE — Telephone Encounter (Signed)
Follow up    Pt is returning call about afib acting up

## 2017-03-23 NOTE — Telephone Encounter (Signed)
New message    Pt is calling asking for a call back from RN. She said her afib is acting up.  Patient c/o Palpitations:  High priority if patient c/o lightheadedness and shortness of breath.  1. How long have you been having palpitations? A couple months off and on. Sometimes last longer than before.  2. Are you currently experiencing lightheadedness and shortness of breath? no  3. Have you checked your BP and heart rate? (document readings)   4. Are you experiencing any other symptoms? no

## 2017-03-23 NOTE — Telephone Encounter (Signed)
Her CHADVASC will be 3 next month with birthday Check CBC/PLT and BMET Increase cardizem to 240 mg get event monitor and if clearly PAF start xarelto 20 mg F/U in afib clinic next week or two Options include AAT or ablation

## 2017-03-23 NOTE — Telephone Encounter (Signed)
Left message to call back  

## 2017-03-23 NOTE — Telephone Encounter (Signed)
Spoke with pt and she states that last night and this morning she was having a severe episode of palps.  States these episodes happen about once a week.  She feel palps everyday but only has the more severe episodes weekly.  Denies CP, dizziness, lightheadedness or SOB with episodes.  States she "just doesn't feel right" when they are occurring so she usually just rests and doesn't get up moving around a lot.  Pt states last year Roderic Palau, NP had mentioned antiarrhythmic meds so she wanted to know if Dr. Johnsie Cancel felt she should go on these now or should she come in to see him?  Advised I would send message to Dr. Johnsie Cancel for review and advisement. Pt appreciative for call.

## 2017-03-23 NOTE — Telephone Encounter (Signed)
Spoke with pt and went over recommendations per Dr. Johnsie Cancel.  Pt in agreement with medication change and labs.  Pt prefers to come into our office to be seen because it is much more expensive to be seen in the Afib clinic for her.  Scheduled pt to see Cecilie Kicks, NP next Wednesday on a day that Dr. Johnsie Cancel is in the office.  Advised pt I would send in new prescription and schedulers will contact her for appt for monitor placement.  Pt verbalized understanding and was appreciative for call.  Will route to Dr Johnsie Cancel to make him aware pt is coming into our office instead of Afib clinic.

## 2017-03-24 ENCOUNTER — Other Ambulatory Visit: Payer: BC Managed Care – PPO | Admitting: *Deleted

## 2017-03-24 DIAGNOSIS — I480 Paroxysmal atrial fibrillation: Secondary | ICD-10-CM

## 2017-03-25 ENCOUNTER — Ambulatory Visit (INDEPENDENT_AMBULATORY_CARE_PROVIDER_SITE_OTHER): Payer: BC Managed Care – PPO

## 2017-03-25 ENCOUNTER — Encounter: Payer: Self-pay | Admitting: Pulmonary Disease

## 2017-03-25 DIAGNOSIS — I480 Paroxysmal atrial fibrillation: Secondary | ICD-10-CM

## 2017-03-25 LAB — BASIC METABOLIC PANEL
BUN/Creatinine Ratio: 24 (ref 12–28)
BUN: 18 mg/dL (ref 8–27)
CALCIUM: 10.3 mg/dL (ref 8.7–10.3)
CO2: 20 mmol/L (ref 20–29)
CREATININE: 0.74 mg/dL (ref 0.57–1.00)
Chloride: 101 mmol/L (ref 96–106)
GFR calc Af Amer: 99 mL/min/{1.73_m2} (ref >59)
GFR calc non Af Amer: 86 mL/min/{1.73_m2} (ref >59)
GLUCOSE: 130 mg/dL — AB (ref 65–99)
Potassium: 4.6 mmol/L (ref 3.5–5.2)
SODIUM: 137 mmol/L (ref 134–144)

## 2017-03-25 LAB — CBC WITH DIFFERENTIAL/PLATELET
Basophils Absolute: 0 10*3/uL (ref 0.0–0.2)
Basos: 1 %
EOS (ABSOLUTE): 0.2 10*3/uL (ref 0.0–0.4)
EOS: 4 %
Hematocrit: 43.1 % (ref 34.0–46.6)
Hemoglobin: 14.2 g/dL (ref 11.1–15.9)
IMMATURE GRANULOCYTES: 0 %
Immature Grans (Abs): 0 10*3/uL (ref 0.0–0.1)
LYMPHS: 35 %
Lymphocytes Absolute: 1.8 10*3/uL (ref 0.7–3.1)
MCH: 30.1 pg (ref 26.6–33.0)
MCHC: 32.9 g/dL (ref 31.5–35.7)
MCV: 92 fL (ref 79–97)
MONOS ABS: 0.3 10*3/uL (ref 0.1–0.9)
Monocytes: 6 %
Neutrophils Absolute: 2.8 10*3/uL (ref 1.4–7.0)
Neutrophils: 54 %
PLATELETS: 248 10*3/uL (ref 150–379)
RBC: 4.71 x10E6/uL (ref 3.77–5.28)
RDW: 14 % (ref 12.3–15.4)
WBC: 5.1 10*3/uL (ref 3.4–10.8)

## 2017-03-25 NOTE — Telephone Encounter (Signed)
BQ  Please Advise- please see pt email   Kathryn Booker  to Juanito Doom, MD       8:35 AM  Dr [name], is there a non-steroid asthma maintenance inhaler I could use instead of the Advair 250? (I have discontinued the [place], and seem to be doing better now.) Having AFib: I have been on Advair for many years and read it can cause AFib so am wondering if it would be best/helpful to switch to a non-steroid inhaler. Thank you.

## 2017-03-26 ENCOUNTER — Other Ambulatory Visit: Payer: Self-pay | Admitting: Internal Medicine

## 2017-03-26 NOTE — Telephone Encounter (Signed)
BQ, please advise on strength of QVAR you wish to Prescribe and instructions. Thanks.

## 2017-03-30 ENCOUNTER — Other Ambulatory Visit: Payer: Self-pay | Admitting: Internal Medicine

## 2017-03-30 DIAGNOSIS — J4520 Mild intermittent asthma, uncomplicated: Secondary | ICD-10-CM

## 2017-03-31 ENCOUNTER — Encounter: Payer: Self-pay | Admitting: Physician Assistant

## 2017-03-31 DIAGNOSIS — I2510 Atherosclerotic heart disease of native coronary artery without angina pectoris: Secondary | ICD-10-CM | POA: Insufficient documentation

## 2017-03-31 MED ORDER — BECLOMETHASONE DIPROP HFA 80 MCG/ACT IN AERB: 2.0000 | INHALATION_SPRAY | Freq: Two times a day (BID) | RESPIRATORY_TRACT | 5 refills | Status: DC

## 2017-03-31 NOTE — Progress Notes (Signed)
Cardiology Office Note    Date:  04/01/2017  ID:  Kathryn Booker, DOB 09/09/1951, MRN [id number] PCP:  Vicie Mutters, PA-C  Cardiologist:  Dr. Johnsie Cancel   Chief Complaint: palpitations  History of Present Illness:  Kathryn Booker is a 66 y.o. female with history of paroxysmal atrial fibrillation, anxiety, COPD, asthma, elevated cholesterol, bifascicular block (RBBB with LAFB), scoliosis, thyroid disease (details unclear), pre-diabetes, former smoking who presents for f/u of atrial fib  She was initially diagnosed with atrial fibrillation 09/2016 after presenting with symptoms of palpitations with event monitor which interestingly showed paroxysmal atrial fibrillation but also periods of palpitations where the patient was in SR. Also showed PVCs and PACs. Sleep study was recommended but she did not wish to pursue due to cost. 2D Echo 09/2016 showed normal EF, grade 1 DD, mild AS, otherwise unremarkable. Decreased EtOH consumption was advised. Given CHADSVASC of 1, anticoagulation was not pursued but with recommendation to consider initating when she turned 66. Of note, BP at 10/27/16 OV was 166/88 but 120/80 at f/u visit 11/28/16 but she has no formal history of HTN. She did have a CT scan 12/2016 for lung cancer screening which was negative but did show coronary artery calcification. Prior calcium scoring by CT in 2013 was 0. Last labs 03/2017 showed glucose 130, K 4.6, Cr 0.74, CBC wnl; labs 02/2017 showed Mg 2.0, A1C 5.9, LDL 128, LFTs wnl, TSH wnl.  Returns for follow-up today to discuss recent palpitations - has noticed more of them ever since June. Dr. Johnsie Cancel recommended event monitor and initiation of anticoagulation given that she turns 66 next month if clear afib was found. She had an episode on 6/22 over the cusp of midnight feeling like her heart was racing, uncomfortable sensation. She took her HR and it was 125bpm. We downloaded strips from her monitor which did in fact show recurrent atrial  fib HR 120s at that time. My nurse is looking into why the office was not notified of this. She has not had any chest pain or SOB.  Past Medical History:  Diagnosis Date  . Anxiety   . Asthma   . COPD (chronic obstructive pulmonary disease) ([facility])   . Coronary artery calcification seen on CT scan   . Depression   . Fatigue   . Former tobacco use   . Hyperlipidemia   . Pre-diabetes   . RBBB (right bundle branch block with left anterior fascicular block)   . Scoliosis   . Serum calcium elevated   . Thyroid disease     Past Surgical History:  Procedure Laterality Date  . APPENDECTOMY    . LAPROSCOPIC     X 2 IN EARLY 30-40'S  . OVARIAN CYST REMOVAL    . RADIOACTIVE SEED GUIDED EXCISIONAL BREAST BIOPSY Right 05/15/2016   Procedure: RIGHT RADIOACTIVE SEED GUIDED EXCISIONAL BREAST BIOPSY;  Surgeon: Rolm Bookbinder, MD;  Location: [facility];  Service: General;  Laterality: Right;  RIGHT RADIOACTIVE SEED GUIDED EXCISIONAL BREAST BIOPSY  . TONSILECTOMY, ADENOIDECTOMY, BILATERAL MYRINGOTOMY AND TUBES      Current Medications: Current Meds  Medication Sig  . ADVAIR DISKUS 250-50 MCG/DOSE AEPB INHALE 1 INHALATION BY MOUTH 2 TIMES DAILY, RINSE MOUTH AFTER INHALATION  . ALPRAZolam (XANAX) 0.5 MG tablet Take 1 tablet (0.5 mg total) by mouth 2 (two) times daily as needed for anxiety.  [name] aspirin EC 325 MG tablet Take 1 tablet (325 mg total) by mouth daily.  . beclomethasone (QVAR REDIHALER)  80 MCG/ACT inhaler Inhale 2 puffs into the lungs 2 (two) times daily.  [name] buPROPion (WELLBUTRIN XL) 300 MG 24 hr tablet Take 1 tablet (300 mg total) by mouth daily.  . Cholecalciferol (VITAMIN D PO) Take 1,000 Units by mouth daily.   . citalopram (CELEXA) 40 MG tablet Take 20 mg by mouth daily.  [name] diltiazem (CARDIZEM CD) 240 MG 24 hr capsule Take 1 capsule (240 mg total) by mouth daily.  [name] ezetimibe (ZETIA) 10 MG tablet Take 1 tablet (10 mg total) by mouth daily.  [name] guaiFENesin (MUCINEX)  600 MG 12 hr tablet Take 600 mg by mouth 2 (two) times daily.   . Magnesium 250 MG TABS Take 1 tablet by mouth daily.   . montelukast (SINGULAIR) 10 MG tablet Take 1 tablet (10 mg total) by mouth at bedtime.  . Multiple Vitamins-Minerals (MULTIVITAMIN WITH MINERALS) tablet Take 1 tablet by mouth daily.  . Omega-3 Fatty Acids (FISH OIL) 1000 MG CAPS Take 1 capsule by mouth daily.   [name] OVER THE COUNTER MEDICATION Eye drop 1 drop each eye daily.  [name] PROAIR HFA 108 (90 Base) MCG/ACT inhaler INHALE 2 PUFFS INTO THE LUNGS EVERY 6 HOURS AS NEEDED FOR WHEEZING OR SHORTNESS OF BREATH.  . triamcinolone (NASACORT ALLERGY 24HR) 55 MCG/ACT AERO nasal inhaler Place 2 sprays into the nose daily.  . valACYclovir (VALTREX) 500 MG tablet TAKE 1 TABLET BY MOUTH DAILY     Allergies:   Lipitor [atorvastatin] and Tetanus toxoids   Social History   Social History  . Marital status: Divorced    Spouse name: N/A  . Number of children: N/A  . Years of education: N/A   Social History Main Topics  . Smoking status: Former Smoker    Packs/day: 2.00    Years: 35.00    Types: Cigarettes    Quit date: 11/19/2013  . Smokeless tobacco: Never Used  . Alcohol use 0.0 oz/week     Comment: social  . Drug use: No  . Sexual activity: Not Asked   Other Topics Concern  . None   Social History Narrative   Divorced   No family   Works at [employer] more   Smokes 1.5 ppd   Sedentary   Stress in life     Family History:  Family History  Problem Relation Age of Onset  . Heart failure Mother   . Hypertension Mother   . Hyperlipidemia Mother   . Heart disease Mother   . Liver disease Father   . Alcohol abuse Father     ROS:   Please see the history of present illness.  All other systems are reviewed and otherwise negative.    PHYSICAL EXAM:   VS:  BP 110/68   Pulse (!) 52   Ht 5\' 3"  (1.6 m)   Wt 145 lb (65.8 kg)   BMI 25.69 kg/m   BMI: Body mass index is 25.69 kg/m. GEN: Well  nourished, well developed WF, in no acute distress  HEENT: normocephalic, atraumatic Neck: no JVD, carotid bruits, or masses Cardiac: RRR; no murmurs, rubs, or gallops, no edema  Respiratory:  clear to auscultation bilaterally, normal work of breathing GI: soft, nontender, nondistended, + BS MS: no deformity or atrophy  Skin: warm and dry, no rash Neuro:  Alert and Oriented x 3, Strength and sensation are intact, follows commands Psych: euthymic mood, full affect  Wt Readings from Last 3 Encounters:  04/01/17 145 lb (65.8 kg)  02/17/17 143 lb 6.4 oz (65 kg)  12/03/16 142 lb (64.4 kg)      Studies/Labs Reviewed:   EKG:  EKG was ordered today and personally reviewed by me and demonstrates sinus bradycardia 52bpm, RBBB, LAFB, similar to prior.  Recent Labs: 02/17/2017: ALT 23; Magnesium 2.0; TSH 1.95 03/24/2017: BUN 18; Creatinine, Ser 0.74; Hemoglobin 14.2; Platelets 248; Potassium 4.6; Sodium 137   Lipid Panel    Component Value Date/Time   CHOL 197 02/17/2017 0927   TRIG 138 02/17/2017 0927   HDL 41 (L) 02/17/2017 0927   CHOLHDL 4.8 02/17/2017 0927   VLDL 28 02/17/2017 0927   LDLCALC 128 (H) 02/17/2017 0927    Additional studies/ records that were reviewed today include: Summarized above.    ASSESSMENT & PLAN:   1. Palpitations c/w recurrent paroxysmal atrial fibrillation - discussed monitor results with Dr. Rayann Heman with EP. The above event on 03/27/17 happened after she had been on increased dose of diltiazem for several days. She is in NSR/SB with HR down to low 50s today. She likely needs an antiarrhythmic at this point. He said he would be hesitant to use flecainide given her bifascicular block but would feel that Multaq would be appropriate. She has never had clinical CHF. This medication can contribute to worsening bradycardia so given that her sinus HR is in the low 50s, will cut diltiazem back to 120mg  daily. CHADSVASC is currently at least 1 but she is right on the  border of 2 additional points - 1 for vascular disease seen on CT in 12/2016 and also turns 66 in a few weeks. I agree with Dr. Johnsie Cancel and favor initiation of anticoagulation. Risks/benefits discussed. Start Xarelto 20mg  q supper. Will refer to EP to f/u in approximately 5 weeks (she has several more weeks on event monitor) to review response to Multaq and potential other strategies if this is unsuccessful (i.e. Ablation). Discussed avoidance of decongestants. 2. Hyperlipidemia - currently followed by PCP, on Zetia. Prior myalgias with Lipitor. Going forward might consider reintroduction of an alternative statin. Given that we are making multiple med changes at this time I will hold off in case she has any reactions so that we don't complicate the picture. She is also looking for a PCP. I gave her several recommendations. 3. Coronary calcification on CT scan - no recent anginal symptoms. See above regarding lipids. Continue BP control and healthy lifestyle. Will discontinue aspirin given that we are starting Xarelto. 4. Bifascicular block - follow for significant bradycardia via monitor.  Disposition: Refer to Dr. Rayann Heman for atrial fib in 5 weeks (post event monitor).  Medication Adjustments/Labs and Tests Ordered: Current medicines are reviewed at length with the patient today.  Concerns regarding medicines are outlined above. Medication changes, Labs and Tests ordered today are summarized above and listed in the Patient Instructions accessible in Encounters.   Signed, Charlie Pitter, PA-C  04/01/2017 10:54 AM    [address], [place], [place]  [address] Phone: [phone]; Fax: [phone]

## 2017-04-01 ENCOUNTER — Encounter: Payer: Self-pay | Admitting: Physician Assistant

## 2017-04-01 ENCOUNTER — Ambulatory Visit: Payer: BC Managed Care – PPO | Admitting: Cardiology

## 2017-04-01 ENCOUNTER — Ambulatory Visit (INDEPENDENT_AMBULATORY_CARE_PROVIDER_SITE_OTHER): Payer: BC Managed Care – PPO | Admitting: Physician Assistant

## 2017-04-01 VITALS — BP 110/68 | HR 52 | Ht 63.0 in | Wt 145.0 lb

## 2017-04-01 DIAGNOSIS — R002 Palpitations: Secondary | ICD-10-CM

## 2017-04-01 DIAGNOSIS — E785 Hyperlipidemia, unspecified: Secondary | ICD-10-CM

## 2017-04-01 DIAGNOSIS — I480 Paroxysmal atrial fibrillation: Secondary | ICD-10-CM

## 2017-04-01 DIAGNOSIS — I2510 Atherosclerotic heart disease of native coronary artery without angina pectoris: Secondary | ICD-10-CM | POA: Diagnosis not present

## 2017-04-01 DIAGNOSIS — I452 Bifascicular block: Secondary | ICD-10-CM

## 2017-04-01 MED ORDER — DILTIAZEM HCL ER COATED BEADS 120 MG PO CP24: 120.0000 mg | ORAL_CAPSULE | Freq: Every day | ORAL | 3 refills | Status: DC

## 2017-04-01 MED ORDER — RIVAROXABAN 20 MG PO TABS: 20.0000 mg | ORAL_TABLET | Freq: Every day | ORAL | 1 refills | Status: DC

## 2017-04-01 MED ORDER — DRONEDARONE HCL 400 MG PO TABS: 400.0000 mg | ORAL_TABLET | Freq: Two times a day (BID) | ORAL | 1 refills | Status: DC

## 2017-04-01 NOTE — Patient Instructions (Addendum)
Medication Instructions:  Your physician has recommended you make the following change in your medication:  1.  START Xarelto 20 mg taking 1 tablet every night with supper 2.  START Multaq 400 mg taking 1 tablet twice a day 3.  STOP the Aspirin 4.  DECREASE Diltiazem to 120 taking 1 tablet daily  Labwork: None ordered  Testing/Procedures: None ordered  Follow-Up: Your physician recommends that you schedule a follow-up appointment in: [facility] DR. ALLRED IN THE E.P DEPT FOR AFIB   Any Other Special Instructions Will Be Listed Below (If Applicable).  Dr. Reginia Forts is the name of the primary care physician you may look into  I WOULD RECOMMEND [facility]   If you need a refill on your cardiac medications before your next appointment, please call your pharmacy.

## 2017-04-02 ENCOUNTER — Telehealth: Payer: Self-pay | Admitting: Pulmonary Disease

## 2017-04-02 NOTE — Telephone Encounter (Signed)
Received PA request from [facility] for Qvar 2mcg. Called [facility] to start PA. PA has been approved until 04/02/18. [facility] [address] is aware. Nothing else needed at time of call.

## 2017-04-07 ENCOUNTER — Telehealth: Payer: Self-pay | Admitting: Physician Assistant

## 2017-04-07 NOTE — Telephone Encounter (Signed)
Spoke with pt, she is feeling more tired, if she sits down to watch tv she will fall asleep. She has no energy. Explained there is only one dose of the multaq and that 1/2 of the dose will not be effective. She is taking wellbutrin and celexa and has taken these for at least 5 years, she wonders if she should decrease on of those drugs instead. Will forward to dayna dunn pa to review and advise

## 2017-04-07 NOTE — Telephone Encounter (Signed)
Agree that half dose would not be effective, would need to be on 400mg  BID for this to remain effective. Any other symptoms such as low heart rate, low blood pressure, or bleeding? Is she following good sleep habits and getting enough sleep at night? Any caffeine use? Syesha Thaw PA-C

## 2017-04-07 NOTE — Telephone Encounter (Signed)
Called patient to discuss - reports mild fatigue (just overall lack of energy) since starting Multaq. She has been following her BP and HR - has not noticed any bradycardia or HR <60s, BP well controlled. Intermittent episodes of HR 120s but short-lived. Drinks a lot of water during the day and wakes up several times during the night to use the bathroom. She reports in the past she's had to take a medicine for several days to get used to it and wonders if this is the case. No CP, SOB, dizziness, presyncope or syncope. She is going to the [facility]. I advised that she continue Multaq as prescribed for now but to call if persistent or worsening symptoms, at which time we would probably need to move her EP appointment up or get her into the AF clinic. She previously declined AF clinic due to increased cost. Discussed eval for OSA with her but she does not seem interested given that symptoms seem more temporally related to timing of med. Denies any bleeding or any other significant sx. Dayna Dunn PA-C

## 2017-04-07 NOTE — Telephone Encounter (Signed)
New Message  Pt c/o medication issue:  1. Name of Medication: Xarelto and Multaq  2. How are you currently taking this medication (dosage and times per day)? 20mg ......400mg    3. Are you having a reaction (difficulty breathing--STAT)? Drained   4. What is your medication issue? Per pt states she just stated taking medication. Pt states she is started to feel drained and would like to know if she could take half of the multaq. Please call back to discuss

## 2017-04-12 ENCOUNTER — Other Ambulatory Visit (HOSPITAL_COMMUNITY): Payer: Self-pay | Admitting: Nurse Practitioner

## 2017-04-17 ENCOUNTER — Telehealth: Payer: Self-pay | Admitting: Physician Assistant

## 2017-04-17 NOTE — Telephone Encounter (Signed)
New Message  Pt states she received a call this morning around 7:30am. Please call back to discuss if needed.

## 2017-04-17 NOTE — Telephone Encounter (Signed)
There is no indication of who called this patient.  Closing encounter.

## 2017-04-20 ENCOUNTER — Encounter: Payer: Self-pay | Admitting: Physician Assistant

## 2017-04-20 ENCOUNTER — Telehealth: Payer: Self-pay

## 2017-04-20 MED ORDER — DILTIAZEM HCL 30 MG PO TABS: 30.0000 mg | ORAL_TABLET | ORAL | 2 refills | Status: DC | PRN

## 2017-04-20 NOTE — Telephone Encounter (Signed)
Hi Pam,  Please call Ms. Dewolfe again - please let her know Dr. Rayann Heman is off this week so I spoke with Dr. Caryl Comes (who is covering EP in the hospital today). He states that we would consider flecainide but this would require close monitoring of her EKGs. On the flipside Dr. Rayann Heman did not recommend this approach, so this would need to be discussed with him before proceeding. Tikosyn would be another consideration but this requires an appointment in the atrial fib clinic beforehand along with 3 day hospital stay for monitoring. Ablation is also an option. All of these require appointments in clinic so I would recommend she keep f/u with Dr. Rayann Heman to discuss. She can stay off the Multaq for now. I also recommend she see her PCP in the meantime to evaluate for other underlying causes of her fatigue and hair loss, as sometimes there is an underlying medical issue going on completely independent of the atrial fib (which could in turn be aggravating the heart rhythm issue). I cannot find anything in the literature that suggests Multaq can cause hair loss so I just want to make sure we are being thorough.     In the meantime she should continue current diltiazem dose, but can you please also send in a diltiazem 30mg  tablet 1 tablet daily as needed for fast palpitations? Thanks.    Dayna Dunn PA-C    Called patient and informed her of Melina Copa PA's message. Patient stated that she would try the diltiazem 30 mg by mouth as needed for fast palpitations and keep her appointment with Dr. Rayann Heman. Patient verbalized understanding of medication changes.

## 2017-05-06 ENCOUNTER — Ambulatory Visit (INDEPENDENT_AMBULATORY_CARE_PROVIDER_SITE_OTHER): Payer: BC Managed Care – PPO | Admitting: Internal Medicine

## 2017-05-06 ENCOUNTER — Encounter: Payer: Self-pay | Admitting: Internal Medicine

## 2017-05-06 ENCOUNTER — Encounter: Payer: Self-pay | Admitting: Physician Assistant

## 2017-05-06 ENCOUNTER — Ambulatory Visit (INDEPENDENT_AMBULATORY_CARE_PROVIDER_SITE_OTHER): Payer: BC Managed Care – PPO | Admitting: Physician Assistant

## 2017-05-06 VITALS — BP 110/74 | HR 63 | Temp 97.9°F | Resp 14 | Ht 63.0 in | Wt 144.2 lb

## 2017-05-06 VITALS — BP 124/62 | HR 56 | Ht 63.0 in | Wt 144.0 lb

## 2017-05-06 DIAGNOSIS — F3341 Major depressive disorder, recurrent, in partial remission: Secondary | ICD-10-CM | POA: Diagnosis not present

## 2017-05-06 DIAGNOSIS — R6889 Other general symptoms and signs: Secondary | ICD-10-CM | POA: Diagnosis not present

## 2017-05-06 DIAGNOSIS — Z0001 Encounter for general adult medical examination with abnormal findings: Secondary | ICD-10-CM

## 2017-05-06 DIAGNOSIS — M8580 Other specified disorders of bone density and structure, unspecified site: Secondary | ICD-10-CM | POA: Diagnosis not present

## 2017-05-06 DIAGNOSIS — Z23 Encounter for immunization: Secondary | ICD-10-CM | POA: Diagnosis not present

## 2017-05-06 DIAGNOSIS — I452 Bifascicular block: Secondary | ICD-10-CM

## 2017-05-06 DIAGNOSIS — E782 Mixed hyperlipidemia: Secondary | ICD-10-CM | POA: Diagnosis not present

## 2017-05-06 DIAGNOSIS — I2510 Atherosclerotic heart disease of native coronary artery without angina pectoris: Secondary | ICD-10-CM | POA: Diagnosis not present

## 2017-05-06 DIAGNOSIS — J45909 Unspecified asthma, uncomplicated: Secondary | ICD-10-CM | POA: Diagnosis not present

## 2017-05-06 DIAGNOSIS — E039 Hypothyroidism, unspecified: Secondary | ICD-10-CM

## 2017-05-06 DIAGNOSIS — R7303 Prediabetes: Secondary | ICD-10-CM

## 2017-05-06 DIAGNOSIS — G8929 Other chronic pain: Secondary | ICD-10-CM

## 2017-05-06 DIAGNOSIS — F419 Anxiety disorder, unspecified: Secondary | ICD-10-CM | POA: Diagnosis not present

## 2017-05-06 DIAGNOSIS — Z9189 Other specified personal risk factors, not elsewhere classified: Secondary | ICD-10-CM | POA: Diagnosis not present

## 2017-05-06 DIAGNOSIS — I480 Paroxysmal atrial fibrillation: Secondary | ICD-10-CM

## 2017-05-06 DIAGNOSIS — Z79899 Other long term (current) drug therapy: Secondary | ICD-10-CM

## 2017-05-06 DIAGNOSIS — J301 Allergic rhinitis due to pollen: Secondary | ICD-10-CM

## 2017-05-06 DIAGNOSIS — D649 Anemia, unspecified: Secondary | ICD-10-CM

## 2017-05-06 DIAGNOSIS — E559 Vitamin D deficiency, unspecified: Secondary | ICD-10-CM

## 2017-05-06 DIAGNOSIS — M545 Low back pain: Secondary | ICD-10-CM

## 2017-05-06 LAB — TSH: TSH: 2.12 m[IU]/L

## 2017-05-06 LAB — CBC WITH DIFFERENTIAL/PLATELET
BASOS PCT: 1 %
Basophils Absolute: 61 cells/uL (ref 0–200)
EOS PCT: 2 %
Eosinophils Absolute: 122 cells/uL (ref 15–500)
HEMATOCRIT: 42.9 % (ref 35.0–45.0)
HEMOGLOBIN: 14.3 g/dL (ref 11.7–15.5)
LYMPHS ABS: 1891 {cells}/uL (ref 850–3900)
Lymphocytes Relative: 31 %
MCH: 31.8 pg (ref 27.0–33.0)
MCHC: 33.3 g/dL (ref 32.0–36.0)
MCV: 95.3 fL (ref 80.0–100.0)
MONO ABS: 488 {cells}/uL (ref 200–950)
MPV: 9.7 fL (ref 7.5–12.5)
Monocytes Relative: 8 %
NEUTROS ABS: 3538 {cells}/uL (ref 1500–7800)
Neutrophils Relative %: 58 %
Platelets: 264 10*3/uL (ref 140–400)
RBC: 4.5 MIL/uL (ref 3.80–5.10)
RDW: 13.7 % (ref 11.0–15.0)
WBC: 6.1 10*3/uL (ref 3.8–10.8)

## 2017-05-06 MED ORDER — CITALOPRAM HYDROBROMIDE 40 MG PO TABS: 40.0000 mg | ORAL_TABLET | Freq: Every day | ORAL | 1 refills | Status: DC

## 2017-05-06 MED ORDER — MONTELUKAST SODIUM 10 MG PO TABS: 10.0000 mg | ORAL_TABLET | Freq: Every day | ORAL | 1 refills | Status: DC

## 2017-05-06 MED ORDER — BUPROPION HCL ER (XL) 300 MG PO TB24: 300.0000 mg | ORAL_TABLET | Freq: Every day | ORAL | 1 refills | Status: DC

## 2017-05-06 MED ORDER — VALACYCLOVIR HCL 500 MG PO TABS: 500.0000 mg | ORAL_TABLET | Freq: Every day | ORAL | 1 refills | Status: DC

## 2017-05-06 MED ORDER — EZETIMIBE 10 MG PO TABS: 10.0000 mg | ORAL_TABLET | Freq: Every day | ORAL | 0 refills | Status: DC

## 2017-05-06 NOTE — Progress Notes (Signed)
Electrophysiology Office Note   Date:  05/06/2017   ID:  Kathryn Booker, DOB 11-26-1950, MRN [id number]  PCP:  Vicie Mutters, PA-C  Cardiologist:  Dr Johnsie Cancel Primary Electrophysiologist: Thompson Grayer, MD    CC: afib   History of Present Illness: Kathryn Booker is a 66 y.o. female who presents today for electrophysiology evaluation.   She is referred by Dr Johnsie Cancel and Melina Copa for afib evaluation and management.  She was initially diagnosed with afib within the past year.  Episodes have increased since that time but remain infrequent.  She reports palpitations and fatigue with her afib.  Occasional reduction in exercise tolerance.  Unaware of triggers or precipitants.  She has worn an event monitor which confirms afib.  She was placed on multaq but did not tolerate this medicine due to fatigue and concerns for hair loss.  She has stopped multaq and feels "better".  Today, she denies symptoms of chest pain, shortness of breath, orthopnea, PND, lower extremity edema, claudication, dizziness, presyncope, syncope, bleeding, or neurologic sequela. The patient is tolerating medications without difficulties and is otherwise without complaint today.    Past Medical History:  Diagnosis Date  . Anxiety   . Asthma   . COPD (chronic obstructive pulmonary disease) ([facility])   . Coronary artery calcification seen on CT scan   . Depression   . Fatigue   . Former tobacco use   . Hyperlipidemia   . Paroxysmal atrial fibrillation ([facility])   . Pre-diabetes   . RBBB (right bundle branch block with left anterior fascicular block)   . Scoliosis   . Serum calcium elevated   . Thyroid disease    Past Surgical History:  Procedure Laterality Date  . APPENDECTOMY    . LAPROSCOPIC     X 2 IN EARLY 30-40'S  . OVARIAN CYST REMOVAL    . RADIOACTIVE SEED GUIDED EXCISIONAL BREAST BIOPSY Right 05/15/2016   Procedure: RIGHT RADIOACTIVE SEED GUIDED EXCISIONAL BREAST BIOPSY;  Surgeon: Rolm Bookbinder, MD;   Location: [facility];  Service: General;  Laterality: Right;  RIGHT RADIOACTIVE SEED GUIDED EXCISIONAL BREAST BIOPSY  . TONSILECTOMY, ADENOIDECTOMY, BILATERAL MYRINGOTOMY AND TUBES       Current Outpatient Prescriptions  Medication Sig Dispense Refill  . ADVAIR DISKUS 250-50 MCG/DOSE AEPB INHALE 1 INHALATION BY MOUTH 2 TIMES DAILY, RINSE MOUTH AFTER INHALATION 180 each 2  . ALPRAZolam (XANAX) 0.5 MG tablet Take 1 tablet (0.5 mg total) by mouth 2 (two) times daily as needed for anxiety. 60 tablet 0  . buPROPion (WELLBUTRIN XL) 300 MG 24 hr tablet Take 1 tablet (300 mg total) by mouth daily. 90 tablet 1  . Cholecalciferol (VITAMIN D PO) Take 1,000 Units by mouth daily.     . citalopram (CELEXA) 40 MG tablet Take 1 tablet (40 mg total) by mouth daily. 90 tablet 1  . diltiazem (CARDIZEM CD) 120 MG 24 hr capsule Take 1 capsule (120 mg total) by mouth daily. 90 capsule 3  . diltiazem (CARDIZEM) 30 MG tablet Take 1 tablet (30 mg total) by mouth as needed (fast palpitations). 30 tablet 2  . ezetimibe (ZETIA) 10 MG tablet Take 1 tablet (10 mg total) by mouth daily. 90 tablet 0  . guaiFENesin (MUCINEX) 600 MG 12 hr tablet Take 600 mg by mouth 2 (two) times daily.     . Magnesium 250 MG TABS Take 1 tablet by mouth daily.     . montelukast (SINGULAIR) 10 MG tablet Take 1  tablet (10 mg total) by mouth at bedtime. 90 tablet 1  . Multiple Vitamins-Minerals (MULTIVITAMIN WITH MINERALS) tablet Take 1 tablet by mouth daily.    . Omega-3 Fatty Acids (FISH OIL) 1000 MG CAPS Take 1 capsule by mouth daily.     [name] OVER THE COUNTER MEDICATION Eye drop 1 drop each eye daily.    [name] PROAIR HFA 108 (90 Base) MCG/ACT inhaler INHALE 2 PUFFS INTO THE LUNGS EVERY 6 HOURS AS NEEDED FOR WHEEZING OR SHORTNESS OF BREATH. 3 Inhaler 1  . rivaroxaban (XARELTO) 20 MG TABS tablet Take 1 tablet (20 mg total) by mouth daily with supper. 30 tablet 1  . triamcinolone (NASACORT ALLERGY 24HR) 55 MCG/ACT AERO nasal inhaler  Place 2 sprays into the nose daily.    . valACYclovir (VALTREX) 500 MG tablet Take 1 tablet (500 mg total) by mouth daily. 90 tablet 1  . beclomethasone (QVAR REDIHALER) 80 MCG/ACT inhaler Inhale 2 puffs into the lungs 2 (two) times daily. (Patient not taking: Reported on 05/06/2017) 1 Inhaler 5   No current facility-administered medications for this visit.     Allergies:   Lipitor [atorvastatin] and Tetanus toxoids   Social History:  The patient  reports that she quit smoking about 3 years ago. Her smoking use included Cigarettes. She has a 70.00 pack-year smoking history. She has never used smokeless tobacco. She reports that she drinks alcohol. She reports that she does not use drugs.   Family History:  The patient's  family history includes Alcohol abuse in her father; Heart disease in her mother; Heart failure in her mother; Hyperlipidemia in her mother; Hypertension in her mother; Liver disease in her father.    ROS:  Please see the history of present illness.   All other systems are personally reviewed and negative.    PHYSICAL EXAM: VS:  BP 124/62   Pulse (!) 56   Ht 5\' 3"  (1.6 m)   Wt 144 lb (65.3 kg)   SpO2 98%   BMI 25.51 kg/m  , BMI Body mass index is 25.51 kg/m. GEN: Well nourished, well developed, in no acute distress  HEENT: normal  Neck: no JVD, carotid bruits, or masses Cardiac: RRR; no murmurs, rubs, or gallops,no edema  Respiratory:  clear to auscultation bilaterally, normal work of breathing GI: soft, nontender, nondistended, + BS MS: no deformity or atrophy  Skin: warm and dry  Neuro:  Strength and sensation are intact Psych: euthymic mood, full affect    Recent Labs: 02/17/2017: ALT 23; Magnesium 2.0; TSH 1.95 03/24/2017: BUN 18; Creatinine, Ser 0.74; Hemoglobin 14.2; Platelets 248; Potassium 4.6; Sodium 137  personally reviewed   Lipid Panel     Component Value Date/Time   CHOL 197 02/17/2017 0927   TRIG 138 02/17/2017 0927   HDL 41 (L) 02/17/2017  0927   CHOLHDL 4.8 02/17/2017 0927   VLDL 28 02/17/2017 0927   LDLCALC 128 (H) 02/17/2017 0927   personally reviewed   Wt Readings from Last 3 Encounters:  05/06/17 144 lb (65.3 kg)  05/06/17 144 lb 3.2 oz (65.4 kg)  04/01/17 145 lb (65.8 kg)      Other studies personally reviewed: Additional studies/ records that were reviewed today include: Dayne Dunn's notes, Dr Mariana Arn notes, prior echo, event monitor  Review of the above records today demonstrates: as above   ASSESSMENT AND PLAN:  1.  Paroxysmal atrial fibrillation The patient has recurrent symptomatic afib. chads2vasc score is 2.  She is on xarelto. She has  failed medical therapy with multaq. Therapeutic strategies for afib including medicine (tikosyn) and ablation were discussed in detail with the patient today. Risk, benefits, and alternatives to EP study and radiofrequency ablation for afib were also discussed in detail today.  At this time, she would prefer a more conservative strategy.  If her afib increases, she may be more willing to consider ablation. Given AV conduction disease on EKG, I would avoid Ic medicines for this patient.  2. Snoring Home sleep study previously declined by insurance I have advised that she see ENT for evaluation of nasal chronic congestion and to further explore OSA as an issue  Follow-up:  Return to see me in 3 months unless problems arise    Signed, Thompson Grayer, MD  05/06/2017 12:25 PM     [facility] [address] [place] [address] [phone] (office) [phone] (fax)

## 2017-05-06 NOTE — Patient Instructions (Addendum)
Cologuard is an easy to use noninvasive colon cancer screening test based on the latest advances in stool DNA science.   Colon cancer is 3rd most diagnosed cancer and 2nd leading cause of death in both men and women 65 years of age and older despite being one of the most preventable and treatable cancers if found early.  4 of out 5 people diagnosed with colon cancer have NO prior family history.  When caught EARLY 90% of colon cancer is curable.   You have agreed to do a Cologuard screening and have declined a colonoscopy in spite of being explained the risks and benefits of the colonoscopy in detail, including cancer and death. Please understand that this is test not as sensitive or specific as a colonoscopy and you are still recommended to get a colonoscopy.   If you are NOT medicare please call your insurance company and give them these items to see if they will cover it: 1) CPT code, [phone] 2) Provider is Probation officer 3) [employer] NPI [id number] 4) [place] Tax ID [phone]  Out-of-pocket cost for Cologuard can range from $0 - $649 so please call  You will receive a short call from [place] support center at [employer], when you receive a call they will say they are from [place],  to confirm your mailing address and give you more information.  When they calll you, it will appear on the caller ID as "[employer]" or in some cases only this number will appear, [phone].   [employer] will ship your collection kit directly to you. You will collect a single stool sample in the privacy of your own home, no special preparation required. You will return the kit via [place] pre-paid shipping or pick-up, in the same box it arrived in. Then I will contact you to discuss your results after I receive them from the laboratory.   If you have any questions or concerns, Cologuard Customer Support Specialist are available 24 hours a  day, 7 days a week at [phone] or go to [email or web address].     [facility]  7 a.m.-6:30 p.m., Monday 7 a.m.-5 p.m., Tuesday-Friday Schedule an appointment by calling [phone].  Encourage you to get the 3D Mammogram  The 3D Mammogram is much more specific and sensitive to pick up breast cancer. For women with fibrocystic breast or lumpy breast it can be hard to determine if it is cancer or not but the 3D mammogram is able to tell this difference which cuts back on unneeded additional tests or scary call backs.   - over 40% increase in detection of breast cancer - over 40% reduction in false positives.  - fewer call backs - reduced anxiety - improved outcomes - [facility]     Bad carbs also include fruit juice, alcohol, and sweet tea. These are empty calories that do not signal to your brain that you are full.   Please remember the good carbs are still carbs which convert into sugar. So please measure them out no more than 1/2-1 cup of rice, oatmeal, pasta, and beans  Veggies are however free foods! Pile them on.   Not all fruit is created equal. Please see the list below, the fruit at the bottom is higher in sugars than the fruit at the top. Please avoid all dried fruits.

## 2017-05-06 NOTE — Patient Instructions (Signed)
Medication Instructions:  Your physician recommends that you continue on your current medications as directed. Please refer to the Current Medication list given to you today.   Labwork: None Ordered   Testing/Procedures: None Ordered   Follow-Up: Your physician recommends that you schedule a follow-up appointment in: 3 months with Dr. Rayann Heman   If you need a refill on your cardiac medications before your next appointment, please call your pharmacy.   Thank you for choosing [facility]! Christen Bame, RN [phone]

## 2017-05-06 NOTE — Progress Notes (Signed)
Complete Physical  Assessment and Plan:  Hypothyroidism, unspecified hypothyroidism type Hypothyroidism-check TSH level, continue medications the same, reminded to take on an empty stomach 30-42mins before food.  -     TSH  Hyperlipidemia -continue medications, check lipids, decrease fatty foods, increase activity.  -     Lipid panel -     Urinalysis, Routine w reflex microscopic (not at [facility]) -     Microalbumin / creatinine urine ratio  Chronic obstructive pulmonary disease, unspecified COPD type ([place]) Continue medications.   Prediabetes -     Hemoglobin A1c  Vitamin D deficiency -     VITAMIN D 25 Hydroxy (Vit-D Deficiency, Fractures)  RBBB (right bundle branch block with left anterior fascicular block) No changes  Asthma, mild intermittent, uncomplicated Continue medications  At risk for coronary artery disease Control blood pressure, cholesterol, glucose, increase exercise.   Anxiety continue medications, stress management techniques discussed, increase water, good sleep hygiene discussed, increase exercise, and increase veggies.   Osteopenia Osteopenia-  continue Vit D and Ca, weight bearing exercises  Medication management -     CBC with Differential/Platelet -     BASIC METABOLIC PANEL WITH GFR -     Hepatic function panel -     Magnesium  Anemia, unspecified anemia type -     Iron and TIBC -     Ferritin  Encounter for general adult medical examination with abnormal findings Due next year Defer EKG, follows cards Colonoscopy- patient declines a colonoscopy even though the risks and benefits were discussed at length. Colon cancer is 3rd most diagnosed cancer and 2nd leading cause of death in both men and women 61 years of age and older. Patient understands the risk of cancer and death with declining the test however they are willing to do cologuard screening instead. They understand that this is not as sensitive or specific as a colonoscopy and they are still  recommended to get a colonoscopy. The cologuard will be sent out to their house.   Paroxysmal A-fib (HCC) Rate controlled Continue cardio follow up On xarelto  Recurrent major depressive disorder, in partial remission ([place]) Depression- continue medications, stress management techniques discussed, increase water, good sleep hygiene discussed, increase exercise, and increase veggies.   Chronic bilateral low back pain without sciatica Continue follow up ortho Will give letter for work  Need for prophylactic vaccination against Streptococcus pneumoniae (pneumococcus) -     Pneumococcal conjugate vaccine 13-valent IM  Refilled her medications -     valACYclovir (VALTREX) 500 MG tablet; Take 1 tablet (500 mg total) by mouth daily. -     ezetimibe (ZETIA) 10 MG tablet; Take 1 tablet (10 mg total) by mouth daily. -     montelukast (SINGULAIR) 10 MG tablet; Take 1 tablet (10 mg total) by mouth at bedtime. -     citalopram (CELEXA) 40 MG tablet; Take 1 tablet (40 mg total) by mouth daily. -     buPROPion (WELLBUTRIN XL) 300 MG 24 hr tablet; Take 1 tablet (300 mg total) by mouth daily.   Discussed med's effects and SE's. Screening labs and tests as requested with regular follow-up as recommended. Over 40 minutes of exam, counseling, chart review, and complex, high level critical decision making was performed this visit.   HPI  66 y.o. female  presents for a complete physical.  Her blood pressure has been controlled at home, today their BP is BP: 110/74 She does not workout, trying to walk. She denies chest pain,  shortness of breath, dizziness.  She has COPD, is on advair, She quit smoking Feb 2015, she smoked for 40 + years, last CXR Jan 2016.  She has lower back pain, no radicular symptoms, is at a desk all day and is requesting a note for a chair with better lumbar support.  She has Afib recently, following with Dr. [name], on xarelto and cardizem.  Has not had sleep study, insurance will  not cover it.  She is on cholesterol medication due to myalgias with statins, but has tolerated livalo.  Her cholesterol is at goal. The cholesterol last visit was:   Lab Results  Component Value Date   CHOL 197 02/17/2017   HDL 41 (L) 02/17/2017   LDLCALC 128 (H) 02/17/2017   TRIG 138 02/17/2017   CHOLHDL 4.8 02/17/2017   She has been working on diet and exercise for prediabetes,  and denies paresthesia of the feet, polydipsia, polyuria and visual disturbances. Last A1C in the office was:  Lab Results  Component Value Date   HGBA1C 5.9 (H) 02/17/2017   Patient is on Vitamin D supplement, 1000 IU daily   Lab Results  Component Value Date   VD25OH 35 05/02/2016     She is on celexa and wellbutrin for anxiety that helps.   BMI is Body mass index is 25.54 kg/m., she is working on diet and exercise. Wt Readings from Last 3 Encounters:  05/06/17 144 lb 3.2 oz (65.4 kg)  04/01/17 145 lb (65.8 kg)  02/17/17 143 lb 6.4 oz (65 kg)     Current Medications:  Current Outpatient Prescriptions on File Prior to Visit  Medication Sig Dispense Refill  . ADVAIR DISKUS 250-50 MCG/DOSE AEPB INHALE 1 INHALATION BY MOUTH 2 TIMES DAILY, RINSE MOUTH AFTER INHALATION 180 each 2  . ALPRAZolam (XANAX) 0.5 MG tablet Take 1 tablet (0.5 mg total) by mouth 2 (two) times daily as needed for anxiety. 60 tablet 0  . beclomethasone (QVAR REDIHALER) 80 MCG/ACT inhaler Inhale 2 puffs into the lungs 2 (two) times daily. 1 Inhaler 5  . buPROPion (WELLBUTRIN XL) 300 MG 24 hr tablet Take 1 tablet (300 mg total) by mouth daily. 90 tablet 1  . Cholecalciferol (VITAMIN D PO) Take 1,000 Units by mouth daily.     . citalopram (CELEXA) 40 MG tablet Take 20 mg by mouth daily.    [name] diltiazem (CARDIZEM CD) 120 MG 24 hr capsule Take 1 capsule (120 mg total) by mouth daily. 90 capsule 3  . diltiazem (CARDIZEM) 30 MG tablet Take 1 tablet (30 mg total) by mouth as needed (fast palpitations). 30 tablet 2  . ezetimibe (ZETIA) 10  MG tablet Take 1 tablet (10 mg total) by mouth daily. 90 tablet 0  . guaiFENesin (MUCINEX) 600 MG 12 hr tablet Take 600 mg by mouth 2 (two) times daily.     . Magnesium 250 MG TABS Take 1 tablet by mouth daily.     . montelukast (SINGULAIR) 10 MG tablet Take 1 tablet (10 mg total) by mouth at bedtime. 90 tablet 1  . Multiple Vitamins-Minerals (MULTIVITAMIN WITH MINERALS) tablet Take 1 tablet by mouth daily.    . Omega-3 Fatty Acids (FISH OIL) 1000 MG CAPS Take 1 capsule by mouth daily.     [name] OVER THE COUNTER MEDICATION Eye drop 1 drop each eye daily.    [name] PROAIR HFA 108 (90 Base) MCG/ACT inhaler INHALE 2 PUFFS INTO THE LUNGS EVERY 6 HOURS AS NEEDED FOR WHEEZING OR SHORTNESS  OF BREATH. 3 Inhaler 1  . rivaroxaban (XARELTO) 20 MG TABS tablet Take 1 tablet (20 mg total) by mouth daily with supper. 30 tablet 1  . triamcinolone (NASACORT ALLERGY 24HR) 55 MCG/ACT AERO nasal inhaler Place 2 sprays into the nose daily.    . valACYclovir (VALTREX) 500 MG tablet TAKE 1 TABLET BY MOUTH DAILY 90 tablet 1   No current facility-administered medications on file prior to visit.    Health Maintenance:   Immunization History  Administered Date(s) Administered  . PPD Test 01/26/2014  . Pneumococcal-Unspecified 12/25/2011  . Td 07/22/2006  . Tdap 05/02/2016  . Zoster 01/26/2014   Tdap:07/22/2016 Pneumovax:12/25/11 Prevnar 13 DUE Zostavax:01/26/14 Influenza: 2017  Colonoscopy: 2008 due 2018 wants to get cologuard Mammo: 03/2016 CAT C, had abnormal right breast, has had needle BX and getting excisional BX August BMD: 2016 osteopenia  Pap/ Pelvic: Nov 2015   CXR: 07/2015 Ct cardiac score 2013 CT lumbar 2011 Echo 2017 PFTs 09/2016  [id number] Dentist:q 6 months Patient Care Team: Vicie Mutters, Hershal Coria as PCP - General (Physician Assistant) Josue Hector, MD as Consulting Physician (Cardiology) Inda Castle, MD as Consulting Physician (Gastroenterology) Lavonna Monarch, MD as Consulting Physician  (Dermatology) Marygrace Drought, MD as Consulting Physician (Ophthalmology) Rolm Bookbinder, MD as Consulting Physician (General Surgery)  Medical History:  Past Medical History:  Diagnosis Date  . Anxiety   . Asthma   . COPD (chronic obstructive pulmonary disease) ([place])   . Coronary artery calcification seen on CT scan   . Depression   . Fatigue   . Former tobacco use   . Hyperlipidemia   . Pre-diabetes   . RBBB (right bundle branch block with left anterior fascicular block)   . Scoliosis   . Serum calcium elevated   . Thyroid disease    Allergies Allergies  Allergen Reactions  . Lipitor [Atorvastatin]     Myalgias   . Tetanus Toxoids Itching    SURGICAL HISTORY She  has a past surgical history that includes Tonsilectomy, adenoidectomy, bilateral myringotomy and tubes; Ovarian cyst removal; Appendectomy; LAPROSCOPIC; and Radioactive seed guided excisional breast biopsy (Right, 05/15/2016). FAMILY HISTORY Her family history includes Alcohol abuse in her father; Heart disease in her mother; Heart failure in her mother; Hyperlipidemia in her mother; Hypertension in her mother; Liver disease in her father. SOCIAL HISTORY  She  reports that she quit smoking about 3 years ago. Her smoking use included Cigarettes. She has a 70.00 pack-year smoking history. She has never used smokeless tobacco. She reports that she drinks alcohol. She reports that she does not use drugs.  Review of Systems: Review of Systems  Constitutional: Negative.   HENT: Positive for congestion. Negative for ear discharge, ear pain, hearing loss, nosebleeds, sore throat and tinnitus.   Respiratory: Positive for cough. Negative for hemoptysis, sputum production, shortness of breath, wheezing and stridor.   Gastrointestinal: Positive for heartburn. Negative for abdominal pain, blood in stool, constipation, diarrhea, melena, nausea and vomiting.  Genitourinary: Negative for dysuria, flank pain, frequency,  hematuria and urgency.       + incontinence  Musculoskeletal: Negative.   Skin: Negative.   Neurological: Negative.  Negative for headaches.  Psychiatric/Behavioral: Negative.     Physical Exam: Estimated body mass index is 25.54 kg/m as calculated from the following:   Height as of this encounter: 5\' 3"  (1.6 m).   Weight as of this encounter: 144 lb 3.2 oz (65.4 kg). BP 110/74   Pulse 63   Temp  97.9 F (36.6 C)   Resp 14   Ht 5\' 3"  (1.6 m)   Wt 144 lb 3.2 oz (65.4 kg)   SpO2 99%   BMI 25.54 kg/m  General Appearance: Well nourished, in no apparent distress.  Eyes: PERRLA, EOMs, conjunctiva no swelling or erythema, normal fundi and vessels.  Sinuses: No Frontal/maxillary tenderness  ENT/Mouth: Ext aud canals clear, normal light reflex with TMs without erythema, bulging. Good dentition. No erythema, swelling, or exudate on post pharynx. Tonsils not swollen or erythematous. Hearing normal.  Neck: Supple, thyroid normal. No bruits  Respiratory: Respiratory effort normal, BS equal bilaterally without rales, rhonchi, wheezing or stridor.  Cardio: RRR without murmurs, rubs or gallops. Brisk peripheral pulses without edema.  Chest: symmetric, with normal excursions and percussion.  Breasts: Symmetric, without lumps, nipple discharge, retractions.  Abdomen: Soft, nontender, no guarding, rebound, hernias, masses, or organomegaly.  Lymphatics: Non tender without lymphadenopathy.  Genitourinary: defer next year Musculoskeletal: Full ROM all peripheral extremities,5/5 strength, and normal gait.  Skin: Warm, dry without rashes, lesions, ecchymosis. Neuro: Cranial nerves intact, reflexes equal bilaterally. Normal muscle tone, no cerebellar symptoms. Sensation intact.  Psych: Awake and oriented X 3, normal affect, Insight and Judgment appropriate.   EKG: WNL CRBBB, no ST changes. AORTA SCAN: defer  Vicie Mutters 9:33 AM [facility]

## 2017-05-07 LAB — IRON AND TIBC
%SAT: 37 % (ref 11–50)
IRON: 131 ug/dL (ref 45–160)
TIBC: 356 ug/dL (ref 250–450)
UIBC: 225 ug/dL

## 2017-05-07 LAB — HEPATIC FUNCTION PANEL
ALK PHOS: 74 U/L (ref 33–130)
ALT: 26 U/L (ref 6–29)
AST: 18 U/L (ref 10–35)
Albumin: 4.7 g/dL (ref 3.6–5.1)
BILIRUBIN DIRECT: 0.1 mg/dL (ref <=0.2)
BILIRUBIN INDIRECT: 0.7 mg/dL (ref 0.2–1.2)
Total Bilirubin: 0.8 mg/dL (ref 0.2–1.2)
Total Protein: 7 g/dL (ref 6.1–8.1)

## 2017-05-07 LAB — LIPID PANEL
CHOL/HDL RATIO: 4.1 ratio (ref <5.0)
CHOLESTEROL: 223 mg/dL — AB (ref <200)
HDL: 55 mg/dL (ref >50)
LDL Cholesterol: 135 mg/dL — ABNORMAL HIGH (ref <100)
Triglycerides: 166 mg/dL — ABNORMAL HIGH (ref <150)
VLDL: 33 mg/dL — ABNORMAL HIGH (ref <30)

## 2017-05-07 LAB — FERRITIN: Ferritin: 386 ng/mL — ABNORMAL HIGH (ref 20–288)

## 2017-05-07 LAB — BASIC METABOLIC PANEL WITH GFR
BUN: 16 mg/dL (ref 7–25)
CALCIUM: 10.5 mg/dL — AB (ref 8.6–10.4)
CHLORIDE: 103 mmol/L (ref 98–110)
CO2: 24 mmol/L (ref 20–31)
Creat: 0.77 mg/dL (ref 0.50–0.99)
GFR, EST NON AFRICAN AMERICAN: 81 mL/min (ref >=60)
GFR, Est African American: >89 mL/min (ref >=60)
Glucose, Bld: 97 mg/dL (ref 65–99)
POTASSIUM: 4.9 mmol/L (ref 3.5–5.3)
SODIUM: 139 mmol/L (ref 135–146)

## 2017-05-07 LAB — HEMOGLOBIN A1C
Hgb A1c MFr Bld: 5.6 % (ref <5.7)
Mean Plasma Glucose: 114 mg/dL

## 2017-05-07 LAB — MAGNESIUM: MAGNESIUM: 2 mg/dL (ref 1.5–2.5)

## 2017-06-10 ENCOUNTER — Encounter: Payer: Self-pay | Admitting: Pulmonary Disease

## 2017-06-17 ENCOUNTER — Other Ambulatory Visit: Payer: Self-pay | Admitting: Physician Assistant

## 2017-06-30 ENCOUNTER — Other Ambulatory Visit: Payer: Self-pay | Admitting: Cardiovascular Disease

## 2017-06-30 MED ORDER — DILTIAZEM HCL 30 MG PO TABS: 30.0000 mg | ORAL_TABLET | ORAL | 8 refills | Status: DC | PRN

## 2017-07-01 ENCOUNTER — Other Ambulatory Visit: Payer: Self-pay | Admitting: Physician Assistant

## 2017-07-01 DIAGNOSIS — Z1231 Encounter for screening mammogram for malignant neoplasm of breast: Secondary | ICD-10-CM

## 2017-07-20 ENCOUNTER — Ambulatory Visit: Payer: BC Managed Care – PPO

## 2017-07-20 ENCOUNTER — Encounter: Payer: Self-pay | Admitting: Internal Medicine

## 2017-08-10 ENCOUNTER — Encounter: Payer: Self-pay | Admitting: Internal Medicine

## 2017-08-10 ENCOUNTER — Ambulatory Visit (INDEPENDENT_AMBULATORY_CARE_PROVIDER_SITE_OTHER): Payer: BC Managed Care – PPO | Admitting: Internal Medicine

## 2017-08-10 VITALS — BP 124/66 | HR 58 | Ht 63.0 in | Wt 147.8 lb

## 2017-08-10 DIAGNOSIS — I480 Paroxysmal atrial fibrillation: Secondary | ICD-10-CM | POA: Diagnosis not present

## 2017-08-10 NOTE — Progress Notes (Signed)
PCP: Vicie Mutters, PA-C Primary Cardiologist: Dr Johnsie Cancel Primary EP: Dr Maxwell Marion is a 66 y.o. female who presents today for routine electrophysiology followup.  Since last being seen in our clinic, the patient reports doing very well. She has afib about twice per month.  Episodes are reasonably short lived.  Today, she denies symptoms of palpitations, chest pain, shortness of breath,  lower extremity edema, dizziness, presyncope, or syncope.  The patient is otherwise without complaint today.   Past Medical History:  Diagnosis Date  . Anxiety   . Asthma   . COPD (chronic obstructive pulmonary disease) ([place])   . Coronary artery calcification seen on CT scan   . Depression   . Fatigue   . Former tobacco use   . Hyperlipidemia   . Paroxysmal atrial fibrillation (HCC)   . Pre-diabetes   . RBBB (right bundle branch block with left anterior fascicular block)   . Scoliosis   . Serum calcium elevated   . Thyroid disease    Past Surgical History:  Procedure Laterality Date  . APPENDECTOMY    . LAPROSCOPIC     X 2 IN EARLY 30-40'S  . OVARIAN CYST REMOVAL    . TONSILECTOMY, ADENOIDECTOMY, BILATERAL MYRINGOTOMY AND TUBES      ROS- all systems are reviewed and negatives except as per HPI above  Current Outpatient Medications  Medication Sig Dispense Refill  . ADVAIR DISKUS 250-50 MCG/DOSE AEPB INHALE 1 INHALATION BY MOUTH 2 TIMES DAILY, RINSE MOUTH AFTER INHALATION 180 each 2  . ALPRAZolam (XANAX) 0.5 MG tablet Take 1 tablet (0.5 mg total) by mouth 2 (two) times daily as needed for anxiety. 60 tablet 0  . buPROPion (WELLBUTRIN XL) 300 MG 24 hr tablet Take 1 tablet (300 mg total) by mouth daily. 90 tablet 1  . Cholecalciferol (VITAMIN D PO) Take 1,000 Units by mouth daily.     . citalopram (CELEXA) 40 MG tablet Take 1 tablet (40 mg total) by mouth daily. 90 tablet 1  . diltiazem (CARDIZEM) 30 MG tablet Take 1 tablet (30 mg total) by mouth as needed (fast palpitations).  30 tablet 8  . ezetimibe (ZETIA) 10 MG tablet Take 1 tablet (10 mg total) by mouth daily. 90 tablet 0  . guaiFENesin (MUCINEX) 600 MG 12 hr tablet Take 600 mg by mouth 2 (two) times daily.     . Magnesium 250 MG TABS Take 1 tablet by mouth daily.     . montelukast (SINGULAIR) 10 MG tablet Take 1 tablet (10 mg total) by mouth at bedtime. 90 tablet 1  . Multiple Vitamins-Minerals (MULTIVITAMIN WITH MINERALS) tablet Take 1 tablet by mouth daily.    [name] OVER THE COUNTER MEDICATION Eye drop 1 drop each eye daily.    [name] PROAIR HFA 108 (90 Base) MCG/ACT inhaler INHALE 2 PUFFS INTO THE LUNGS EVERY 6 HOURS AS NEEDED FOR WHEEZING OR SHORTNESS OF BREATH. 3 Inhaler 1  . triamcinolone (NASACORT ALLERGY 24HR) 55 MCG/ACT AERO nasal inhaler Place 2 sprays into the nose daily.    . valACYclovir (VALTREX) 500 MG tablet Take 1 tablet (500 mg total) by mouth daily. 90 tablet 1  . XARELTO 20 MG TABS tablet TAKE 1 TABLET BY MOUTH DAILY WITH SUPPER. 30 tablet 10  . diltiazem (CARDIZEM CD) 120 MG 24 hr capsule Take 1 capsule (120 mg total) by mouth daily. 90 capsule 3   No current facility-administered medications for this visit.     Physical Exam: Vitals:  08/10/17 1629  BP: 124/66  Pulse: (!) 58  SpO2: 98%  Weight: 147 lb 12.8 oz (67 kg)  Height: 5\' 3"  (1.6 m)    GEN- The patient is well appearing, alert and oriented x 3 today.   Head- normocephalic, atraumatic Eyes-  Sclera clear, conjunctiva pink Ears- hearing intact Oropharynx- clear Lungs- Clear to ausculation bilaterally, normal work of breathing Heart- Regular rate and rhythm, no murmurs, rubs or gallops, PMI not laterally displaced GI- soft, NT, ND, + BS Extremities- no clubbing, cyanosis, or edema  EKG tracing ordered today is personally reviewed and shows sinus rhythm, RBBB, LAHB  Assessment and Plan:  1. Paroxysmal atrial fibrillation Doing well at this time Continue xarelto  Follow-up with me in 6 months  Thompson Grayer MD,  [facility] 08/10/2017 4:50 PM

## 2017-08-10 NOTE — Patient Instructions (Signed)
Medication Instructions:  Your physician recommends that you continue on your current medications as directed. Please refer to the Current Medication list given to you today.  -- If you need a refill on your cardiac medications before your next appointment, please call your pharmacy. --  Labwork: None ordered  Testing/Procedures: None ordered  Follow-Up: Your physician wants you to follow-up in: 6 months with Dr. Rayann Heman.  You will receive a reminder letter in the mail two months in advance. If you don't receive a letter, please call our office to schedule the follow-up appointment.  Thank you for choosing [facility]!!   Frederik Schmidt, RN [phone]  Any Other Special Instructions Will Be Listed Below (If Applicable).

## 2017-08-12 ENCOUNTER — Other Ambulatory Visit: Payer: Self-pay

## 2017-08-12 MED ORDER — EZETIMIBE 10 MG PO TABS: 10.0000 mg | ORAL_TABLET | Freq: Every day | ORAL | 0 refills | Status: DC

## 2017-08-12 NOTE — Telephone Encounter (Signed)
Refill request for Ezetimibe, 10mg  tablet. 90 day supply

## 2017-08-17 ENCOUNTER — Ambulatory Visit: Payer: BC Managed Care – PPO

## 2017-08-31 ENCOUNTER — Other Ambulatory Visit: Payer: Self-pay

## 2017-08-31 DIAGNOSIS — I480 Paroxysmal atrial fibrillation: Secondary | ICD-10-CM

## 2017-09-04 ENCOUNTER — Encounter: Payer: Self-pay | Admitting: Gastroenterology

## 2017-09-10 ENCOUNTER — Encounter: Payer: Self-pay | Admitting: Physician Assistant

## 2017-09-10 MED ORDER — PREDNISONE 20 MG PO TABS: ORAL_TABLET | ORAL | 0 refills | Status: DC

## 2017-09-14 ENCOUNTER — Ambulatory Visit: Payer: BC Managed Care – PPO

## 2017-10-09 ENCOUNTER — Encounter: Payer: Self-pay | Admitting: Physician Assistant

## 2017-10-09 ENCOUNTER — Telehealth (HOSPITAL_COMMUNITY): Payer: Self-pay | Admitting: Cardiovascular Disease

## 2017-10-09 NOTE — Telephone Encounter (Signed)
New Message:  Patient called in stating that her deductible is to high to get echo right now and wants to wait until she is on Medicare. FYI

## 2017-10-09 NOTE — Telephone Encounter (Signed)
Left message with patient to call back when she is ready to schedule her echocardiogram.

## 2017-10-15 ENCOUNTER — Other Ambulatory Visit (HOSPITAL_COMMUNITY): Payer: BC Managed Care – PPO

## 2017-10-19 ENCOUNTER — Ambulatory Visit
Admission: RE | Admit: 2017-10-19 | Discharge: 2017-10-19 | Disposition: A | Discharge status: Home / Self Care | Payer: BC Managed Care – PPO | Source: Ambulatory Visit | Attending: Physician Assistant | Admitting: Physician Assistant

## 2017-10-19 DIAGNOSIS — Z1231 Encounter for screening mammogram for malignant neoplasm of breast: Secondary | ICD-10-CM

## 2017-10-21 ENCOUNTER — Other Ambulatory Visit: Payer: Self-pay | Admitting: Physician Assistant

## 2017-10-21 DIAGNOSIS — R928 Other abnormal and inconclusive findings on diagnostic imaging of breast: Secondary | ICD-10-CM

## 2017-10-26 ENCOUNTER — Ambulatory Visit
Admission: RE | Admit: 2017-10-26 | Discharge: 2017-10-26 | Disposition: A | Discharge status: Home / Self Care | Payer: BC Managed Care – PPO | Source: Ambulatory Visit | Attending: Physician Assistant | Admitting: Physician Assistant

## 2017-10-26 ENCOUNTER — Other Ambulatory Visit: Payer: Self-pay | Admitting: Physician Assistant

## 2017-10-26 DIAGNOSIS — N6489 Other specified disorders of breast: Secondary | ICD-10-CM

## 2017-10-26 DIAGNOSIS — R928 Other abnormal and inconclusive findings on diagnostic imaging of breast: Secondary | ICD-10-CM

## 2017-11-06 ENCOUNTER — Encounter: Payer: Self-pay | Admitting: Physician Assistant

## 2017-11-06 ENCOUNTER — Other Ambulatory Visit: Payer: Self-pay

## 2017-11-06 ENCOUNTER — Ambulatory Visit: Payer: BC Managed Care – PPO | Admitting: Physician Assistant

## 2017-11-06 VITALS — BP 130/74 | HR 82 | Temp 97.9°F | Resp 14 | Ht 63.0 in | Wt 145.2 lb

## 2017-11-06 DIAGNOSIS — F3341 Major depressive disorder, recurrent, in partial remission: Secondary | ICD-10-CM | POA: Diagnosis not present

## 2017-11-06 DIAGNOSIS — E039 Hypothyroidism, unspecified: Secondary | ICD-10-CM | POA: Diagnosis not present

## 2017-11-06 DIAGNOSIS — E782 Mixed hyperlipidemia: Secondary | ICD-10-CM

## 2017-11-06 DIAGNOSIS — I10 Essential (primary) hypertension: Secondary | ICD-10-CM | POA: Diagnosis not present

## 2017-11-06 DIAGNOSIS — I480 Paroxysmal atrial fibrillation: Secondary | ICD-10-CM | POA: Diagnosis not present

## 2017-11-06 DIAGNOSIS — R7303 Prediabetes: Secondary | ICD-10-CM

## 2017-11-06 DIAGNOSIS — J4520 Mild intermittent asthma, uncomplicated: Secondary | ICD-10-CM

## 2017-11-06 DIAGNOSIS — Z79899 Other long term (current) drug therapy: Secondary | ICD-10-CM

## 2017-11-06 MED ORDER — RIVAROXABAN 20 MG PO TABS: ORAL_TABLET | ORAL | 0 refills | Status: DC

## 2017-11-06 MED ORDER — BUPROPION HCL ER (XL) 300 MG PO TB24: 300.0000 mg | ORAL_TABLET | Freq: Every day | ORAL | 1 refills | Status: DC

## 2017-11-06 MED ORDER — DILTIAZEM HCL 30 MG PO TABS: 30.0000 mg | ORAL_TABLET | ORAL | 0 refills | Status: DC | PRN

## 2017-11-06 MED ORDER — EZETIMIBE 10 MG PO TABS: 10.0000 mg | ORAL_TABLET | Freq: Every day | ORAL | 0 refills | Status: DC

## 2017-11-06 MED ORDER — MONTELUKAST SODIUM 10 MG PO TABS: 10.0000 mg | ORAL_TABLET | Freq: Every day | ORAL | 1 refills | Status: DC

## 2017-11-06 MED ORDER — FLUTICASONE-SALMETEROL 250-50 MCG/DOSE IN AEPB: INHALATION_SPRAY | RESPIRATORY_TRACT | 1 refills | Status: DC

## 2017-11-06 NOTE — Progress Notes (Signed)
Assessment and Plan:   Hypertension -Continue medication, monitor blood pressure at home. Continue DASH diet.  Reminder to go to the ER if any CP, SOB, nausea, dizziness, severe HA, changes vision/speech, left arm numbness and tingling and jaw pain.  Cholesterol -Continue diet and exercise. Check cholesterol, not willing to get on livalo, willing to try zetia.    Diabetes  -Continue diet and exercise. Check A1C   Vitamin D Def - check level and continue medications.   Afib Declines sleep study, did breath right strips.  Continue cardio follow up  Continue diet and meds as discussed. Further disposition pending results of labs. Over 30 minutes of exam, counseling, chart review, and critical decision making was performed Future Appointments  Date Time Provider [place]  04/28/2018  3:20 PM [facility] DIAG [place] 1 [facility]  04/28/2018  3:30 PM [facility] [place] 1 [facility]  05/11/2018  9:00 AM Vicie Mutters, PA-C [facility] None    HPI 67 y.o. female  presents for 3 month follow up on hypertension, cholesterol, prediabetes, and vitamin D deficiency.   Her blood pressure has been controlled at home, today their BP is BP: 130/74  She does not workout. She denies chest pain, shortness of breath, dizziness.  Has history of asthma/COPD, she is on ASA 325, she has history of p Afib, follows with Dr. Johnsie Cancel.  She is on allegra, has sinus congestion and cough, but states it is chronic.   She is not on cholesterol medication, suppose to be on livalo but stopped due to memory, she has been on zetia.  Her cholesterol is at goal. The cholesterol last visit was:   Lab Results  Component Value Date   CHOL 223 (H) 05/06/2017   HDL 55 05/06/2017   LDLCALC 135 (H) 05/06/2017   TRIG 166 (H) 05/06/2017   CHOLHDL 4.1 05/06/2017    She has been working on diet and exercise for prediabetes, has been checking A1C at home and it has been better, and denies paresthesia of the feet,  polydipsia, polyuria and visual disturbances. Last A1C in the office was:  Lab Results  Component Value Date   HGBA1C 5.6 05/06/2017   Patient is on Vitamin D supplement.   Lab Results  Component Value Date   VD25OH 60 05/02/2016      BMI is Body mass index is 25.72 kg/m., she is working on diet and exercise. 3 meals a day, snacks at night.  Wt Readings from Last 3 Encounters:  11/06/17 145 lb 3.2 oz (65.9 kg)  08/10/17 147 lb 12.8 oz (67 kg)  05/06/17 144 lb (65.3 kg)    Current Medications:  Current Outpatient Medications on File Prior to Visit  Medication Sig Dispense Refill  . Cholecalciferol (VITAMIN D PO) Take 1,000 Units by mouth daily.     . citalopram (CELEXA) 40 MG tablet Take 1 tablet (40 mg total) by mouth daily. 90 tablet 1  . guaiFENesin (MUCINEX) 600 MG 12 hr tablet Take 600 mg by mouth 2 (two) times daily.     . Magnesium 250 MG TABS Take 1 tablet by mouth daily.     . Multiple Vitamins-Minerals (MULTIVITAMIN WITH MINERALS) tablet Take 1 tablet by mouth daily.    [name] OVER THE COUNTER MEDICATION Eye drop 1 drop each eye daily.    [name] PROAIR HFA 108 (90 Base) MCG/ACT inhaler INHALE 2 PUFFS INTO THE LUNGS EVERY 6 HOURS AS NEEDED FOR WHEEZING OR SHORTNESS OF BREATH. 3 Inhaler 1  .  valACYclovir (VALTREX) 500 MG tablet Take 1 tablet (500 mg total) by mouth daily. 90 tablet 1   No current facility-administered medications on file prior to visit.    Medical History:  Past Medical History:  Diagnosis Date  . Anxiety   . Asthma   . COPD (chronic obstructive pulmonary disease) ([place])   . Coronary artery calcification seen on CT scan   . Depression   . Fatigue   . Former tobacco use   . Hyperlipidemia   . Paroxysmal atrial fibrillation (H[facility])   . Pre-diabetes   . RBBB (right bundle branch block with left anterior fascicular block)   . Scoliosis   . Serum calcium elevated   . Thyroid disease    Allergies:  Allergies  Allergen Reactions  . Lipitor [Atorvastatin]      Myalgias   . Tetanus Toxoids Itching     Review of Systems:  Review of Systems  Constitutional: Positive for malaise/fatigue. Negative for chills, diaphoresis, fever and weight loss.  HENT: Negative for congestion, ear discharge, ear pain, hearing loss, nosebleeds, sore throat and tinnitus.        + snoring  Eyes: Negative.   Respiratory: Negative.  Negative for stridor.   Cardiovascular: Negative.   Gastrointestinal: Negative.   Genitourinary: Negative.   Musculoskeletal: Negative.   Skin: Negative.   Neurological: Negative.  Negative for weakness and headaches.  Psychiatric/Behavioral: Negative.     Family history- Review and unchanged Social history- Review and unchanged Physical Exam: BP 130/74   Pulse 82   Temp 97.9 F (36.6 C)   Resp 14   Ht 5\' 3"  (1.6 m)   Wt 145 lb 3.2 oz (65.9 kg)   SpO2 96%   BMI 25.72 kg/m  Wt Readings from Last 3 Encounters:  11/06/17 145 lb 3.2 oz (65.9 kg)  08/10/17 147 lb 12.8 oz (67 kg)  05/06/17 144 lb (65.3 kg)   General Appearance: Well nourished, in no apparent distress. Eyes: PERRLA, EOMs, conjunctiva no swelling or erythema Sinuses: No Frontal/maxillary tenderness ENT/Mouth: Ext aud canals clear, TMs without erythema, bulging. No erythema, swelling, or exudate on post pharynx.  Tonsils not swollen or erythematous. Hearing normal.  Neck: Supple, thyroid normal.  Respiratory: Respiratory effort normal, BS equal bilaterally without rales, rhonchi, wheezing or stridor.  Cardio: RRR with no MRGs. Brisk peripheral pulses without edema.  Abdomen: Soft, + BS,  Non tender, no guarding, rebound, hernias, masses. Lymphatics: Non tender without lymphadenopathy.  Musculoskeletal: Full ROM, 5/5 strength, Normal gait  Warm, dry without rashes, lesions, ecchymosis.  Neuro: Cranial nerves intact. Normal muscle tone, no cerebellar symptoms. Psych: Awake and oriented X 3, normal affect, Insight and Judgment appropriate.    Vicie Mutters,  PA-C 9:01 AM [facility]

## 2017-11-06 NOTE — Patient Instructions (Addendum)
Diet soda leads to weight gain.  We recently discovered that the artifical sugar in the soda stops an enzyme in your stomach that is suppose to signal that your brain is full. So patients that drink a lot of diet soda will never feel full and tend to over eat. So please cut back on diet soda and it can help with your weight loss.    Little Remedies saline spray (aerosol/mist)- can try this, it is in the kids section   Mini Habits for weight loss  Are you an emotional eater? Do you eat more when you're feeling stressed? Do you eat when you're not hungry or when you're full? Do you eat to feel better (to calm and soothe yourself when you're sad, mad, bored, anxious, etc.)? Do you reward yourself with food? Do you regularly eat until you've stuffed yourself? Does food make you feel safe? Do you feel like food is a friend? Do you feel powerless or out of control around food?  If you answered yes to some of these questions than it is likely that you are an emotional eater. This is normally a learned behavior and can take time to first recognize the signs and second BREAK THE HABIT. But here is more information and tips to help.   The difference between emotional hunger and physical hunger Emotional hunger can be powerful, so it's easy to mistake it for physical hunger. But there are clues you can look for to help you tell physical and emotional hunger apart.  Emotional hunger comes on suddenly. It hits you in an instant and feels overwhelming and urgent. Physical hunger, on the other hand, comes on more gradually. The urge to eat doesn't feel as dire or demand instant satisfaction (unless you haven't eaten for a very long time).  Emotional hunger craves specific comfort foods. When you're physically hungry, almost anything sounds good-including healthy stuff like vegetables. But emotional hunger craves junk food or sugary snacks that provide an instant rush. You feel like you need cheesecake or  pizza, and nothing else will do.  Emotional hunger often leads to mindless eating. Before you know it, you've eaten a whole bag of chips or an entire pint of ice cream without really paying attention or fully enjoying it. When you're eating in response to physical hunger, you're typically more aware of what you're doing.  Emotional hunger isn't satisfied once you're full. You keep wanting more and more, often eating until you're uncomfortably stuffed. Physical hunger, on the other hand, doesn't need to be stuffed. You feel satisfied when your stomach is full.  Emotional hunger isn't located in the stomach. Rather than a growling belly or a pang in your stomach, you feel your hunger as a craving you can't get out of your head. You're focused on specific textures, tastes, and smells.  Emotional hunger often leads to regret, guilt, or shame. When you eat to satisfy physical hunger, you're unlikely to feel guilty or ashamed because you're simply giving your body what it needs. If you feel guilty after you eat, it's likely because you know deep down that you're not eating for nutritional reasons.  Identify your emotional eating triggers What situations, places, or feelings make you reach for the comfort of food? Most emotional eating is linked to unpleasant feelings, but it can also be triggered by positive emotions, such as rewarding yourself for achieving a goal or celebrating a holiday or happy event. Common causes of emotional eating include:  Stuffing emotions -  Eating can be a way to temporarily silence or "stuff down" uncomfortable emotions, including anger, fear, sadness, anxiety, loneliness, resentment, and shame. While you're numbing yourself with food, you can avoid the difficult emotions you'd rather not feel.  Boredom or feelings of emptiness - Do you ever eat simply to give yourself something to do, to relieve boredom, or as a way to fill a void in your life? You feel unfulfilled and empty,  and food is a way to occupy your mouth and your time. In the moment, it fills you up and distracts you from underlying feelings of purposelessness and dissatisfaction with your life.  Childhood habits - Think back to your childhood memories of food. Did your parents reward good behavior with ice cream, take you out for pizza when you got a good report card, or serve you sweets when you were feeling sad? These habits can often carry over into adulthood. Or your eating may be driven by nostalgia-for cherished memories of grilling burgers in the backyard with your dad or baking and eating cookies with your mom.  Social influences - Getting together with other people for a meal is a great way to relieve stress, but it can also lead to overeating. It's easy to overindulge simply because the food is there or because everyone else is eating. You may also overeat in social situations out of nervousness. Or perhaps your family or circle of friends encourages you to overeat, and it's easier to go along with the group.  Stress - Ever notice how stress makes you hungry? It's not just in your mind. When stress is chronic, as it so often is in our chaotic, fast-paced world, your body produces high levels of the stress hormone, cortisol. Cortisol triggers cravings for salty, sweet, and fried foods-foods that give you a burst of energy and pleasure. The more uncontrolled stress in your life, the more likely you are to turn to food for emotional relief.  Find other ways to feed your feelings If you don't know how to manage your emotions in a way that doesn't involve food, you won't be able to control your eating habits for very long. Diets so often fail because they offer logical nutritional advice which only works if you have conscious control over your eating habits. It doesn't work when emotions hijack the process, demanding an immediate payoff with food.  In order to stop emotional eating, you have to find other ways  to fulfill yourself emotionally. It's not enough to understand the cycle of emotional eating or even to understand your triggers, although that's a huge first step. You need alternatives to food that you can turn to for emotional fulfillment.  Alternatives to emotional eating If you're depressed or lonely, call someone who always makes you feel better, play with your dog or cat, or look at a favorite photo or cherished memento.  If you're anxious, expend your nervous energy by dancing to your favorite song, squeezing a stress ball, or taking a brisk walk.  If you're exhausted, treat yourself with a hot cup of tea, take a bath, light some scented candles, or wrap yourself in a warm blanket.  If you're bored, read a good book, watch a comedy show, explore the outdoors, or turn to an activity you enjoy (woodworking, playing the guitar, shooting hoops, scrapbooking, etc.).  What is mindful eating? Mindful eating is a practice that develops your awareness of eating habits and allows you to pause between your triggers and your actions.  Most emotional eaters feel powerless over their food cravings. When the urge to eat hits, you feel an almost unbearable tension that demands to be fed, right now. Because you've tried to resist in the past and failed, you believe that your willpower just isn't up to snuff. But the truth is that you have more power over your cravings than you think.  Take 5 before you give in to a craving Emotional eating tends to be automatic and virtually mindless. Before you even realize what you're doing, you've reached for a tub of ice cream and polished off half of it. But if you can take a moment to pause and reflect when you're hit with a craving, you give yourself the opportunity to make a different decision.  Can you put off eating for five minutes? Or just start with one minute. Don't tell yourself you can't give in to the craving; remember, the forbidden is extremely tempting.  Just tell yourself to wait.  While you're waiting, check in with yourself. How are you feeling? What's going on emotionally? Even if you end up eating, you'll have a better understanding of why you did it. This can help you set yourself up for a different response next time.  How to practice mindful eating Eating while you're also doing other things-such as watching TV, driving, or playing with your phone-can prevent you from fully enjoying your food. Since your mind is elsewhere, you may not feel satisfied or continue eating even though you're no longer hungry. Eating more mindfully can help focus your mind on your food and the pleasure of a meal and curb overeating.   Eat your meals in a calm place with no distractions, aside from any dining companions.  Try eating with your non-dominant hand or using chopsticks instead of a knife and fork. Eating in such a non-familiar way can slow down how fast you eat and ensure your mind stays focused on your food.  Allow yourself enough time not to have to rush your meal. Set a timer for 20 minutes and pace yourself so you spend at least that much time eating.  Take small bites and chew them well, taking time to notice the different flavors and textures of each mouthful.  Put your utensils down between bites. Take time to consider how you feel-hungry, satiated-before picking up your utensils again.  Try to stop eating before you are full.It takes time for the signal to reach your brain that you've had enough. Don't feel obligated to always clean your plate.  When you've finished your food, take a few moments to assess if you're really still hungry before opting for an extra serving or dessert.  Learn to accept your feelings-even the bad ones  While it may seem that the core problem is that you're powerless over food, emotional eating actually stems from feeling powerless over your emotions. You don't feel capable of dealing with your feelings head on,  so you avoid them with food.  Recommended reading  Healthy Eating: A guide to the new nutrition - [place] Report  10 Tips for Mindful Eating - How mindfulness can help you fully enjoy a meal and the experience of eating-with moderation and restraint. ([place])  Weight Loss: Gain Control of Emotional Eating - Tips to regain control of your eating habits. [facility])  Why Stress Causes People to Overeat -Tips on controlling stress eating. ([place])  Mindful Eating Meditations -Free online mindfulness meditations. (  [facility] [employer] [facility])    8 Critical Weight-Loss Tips That Aren't Diet and Exercise  1. STARVE THE DISTRACTIONS  All too often when we eat, we're also multitasking: watching TV, answering emails, scrolling through social media. These habits are detrimental to having a strong, clear, healthy relationship with food, and they can hinder our ability to make dietary changes.  In order to truly focus on what you're eating, how much you're eating, why you're eating those specific foods and, most importantly, how those foods make you feel, you need to starve the distractions. That means when you eat, just eat. Focus on your food, the process it went through to end up on your plate, where it came from and how it nourishes you. With this technique, you're more likely to finish a meal feeling satiated.  2.  CONSIDER WHAT YOU'RE NOT WILLING TO DO  This might sound counterintuitive, but it can help provide a "why" when motivation is waning. Declare, in writing, what you are unwilling to do, for example "I am unwilling to be the old dad who cannot play sports with my children".  So consider what you're not willing to accept, write it down, and keep it at the ready.  3.  STOP LABELING FOOD "GOOD" AND "BAD"  You've probably heard someone say they ate something "bad." Maybe you've even said it yourself.  The trouble with  'bad' foods isn't that they'll send you to the grave after a bite or two. The trouble comes when we eat excessive portions of really calorie-dense foods meal after meal, day after day.  Instead of labeling foods as good or bad, think about which foods you can eat a lot of, and which ones you should just eat a little of. Then, plan ways to eat the foods you really like in portions that fit with your overall goals. A good example of this would be having a slice of pizza alongside a club salad with chicken breast, avocado and a bit of dressing. This is vastly different than 3 slices of pizza, 4 breadsticks with cheese sauce and half of a liter of regular soda.  4.  BRUSH YOUR TEETH AFTER YOU EAT  Getting your mindset in order is important, but sometimes small habits can make a big difference. After eating, you still have the taste of food in their mouth, which often causes people to eat more even if they are full or engage in a nibble or two of dessert.  Brushing your teeth will remove the taste of food from your mouth, and the clean, minty freshness will serve as a cue that mealtime is over.  5.  FOCUS ON CROWDING NOT CUTTING  The most common first step during 'dieting' is to cut. We cut our portion sizes down, we cut out 'bad' foods, we cut out entire food groups. This act of cutting puts [place] and our minds into scarcity mode.  When something is off-limits, even if you're able to avoid it for a while, you could end up bingeing on it later because you've gone so long without it. So, instead of cutting, focus on crowding. If you crowd your plate and fill it up with more foods like veggies and protein, it simply allows less room for the other stuff. In other words, shift your focus away from what you can't eat, and celebrate the foods that will help you reach your goals.  6.  TAKE TRACKING A STEP FURTHER  Track what you eat,  when you ate it, how much you ate and how that food made you feel. Being  completely honest with yourself and writing down every single thing that passes through your lips will help you start to notice that maybe you actually do snack, possibly take in more sugar than you thought, eat when you're bored rather than just hungry or maybe that you have a habit of snacking before bed while watching TV.  The difference from simply tracking your food intake is you're taking into account how food makes you feel, as well as what you're doing while you're eating. This is about becoming more mindful of what, when and why you eat.  7.  PRIORITIZE GOOD SLEEP  One of the strongest risk factors for being overweight is poor sleep. When you're feeling tired, you're more likely to choose unhealthy comfort foods and to skip your workout. Additionally, sleep deprivation may slow down your metabolism. [employer]! Therefore, sleeping 7-8 hours per night can help with weight loss without having to change your diet or increase your physical activity. And if you feel you snore and still wake up tired, talk with me about sleep apnea.  8.  SET ASIDE TIME TO DISCONNECT  Just get out there. Disconnect from the electronics and connect to the elements. Not only will this help reduce stress (a major factor in weight gain) by giving your mind a break from the constant stimulation we've all become so accustomed to, but it may also reprogram your brain to connect with yourself and what you're feeling.

## 2017-11-07 LAB — CBC WITH DIFFERENTIAL/PLATELET
BASOS PCT: 0.6 %
Basophils Absolute: 38 cells/uL (ref 0–200)
EOS ABS: 173 {cells}/uL (ref 15–500)
Eosinophils Relative: 2.7 %
HCT: 42.5 % (ref 35.0–45.0)
HEMOGLOBIN: 14.6 g/dL (ref 11.7–15.5)
Lymphs Abs: 1997 cells/uL (ref 850–3900)
MCH: 32.4 pg (ref 27.0–33.0)
MCHC: 34.4 g/dL (ref 32.0–36.0)
MCV: 94.4 fL (ref 80.0–100.0)
MONOS PCT: 7.4 %
MPV: 9.8 fL (ref 7.5–12.5)
NEUTROS ABS: 3718 {cells}/uL (ref 1500–7800)
Neutrophils Relative %: 58.1 %
Platelets: 257 10*3/uL (ref 140–400)
RBC: 4.5 10*6/uL (ref 3.80–5.10)
RDW: 12.5 % (ref 11.0–15.0)
Total Lymphocyte: 31.2 %
WBC: 6.4 10*3/uL (ref 3.8–10.8)
WBCMIX: 474 {cells}/uL (ref 200–950)

## 2017-11-07 LAB — BASIC METABOLIC PANEL WITH GFR
BUN: 17 mg/dL (ref 7–25)
CALCIUM: 10.8 mg/dL — AB (ref 8.6–10.4)
CO2: 26 mmol/L (ref 20–32)
CREATININE: 0.75 mg/dL (ref 0.50–0.99)
Chloride: 105 mmol/L (ref 98–110)
GFR, Est African American: 97 mL/min/{1.73_m2} (ref >=60)
GFR, Est Non African American: 84 mL/min/{1.73_m2} (ref >=60)
GLUCOSE: 121 mg/dL — AB (ref 65–99)
Potassium: 5 mmol/L (ref 3.5–5.3)
Sodium: 139 mmol/L (ref 135–146)

## 2017-11-07 LAB — HEPATIC FUNCTION PANEL
AG RATIO: 2 (calc) (ref 1.0–2.5)
ALBUMIN MSPROF: 4.6 g/dL (ref 3.6–5.1)
ALT: 27 U/L (ref 6–29)
AST: 16 U/L (ref 10–35)
Alkaline phosphatase (APISO): 76 U/L (ref 33–130)
BILIRUBIN INDIRECT: 0.5 mg/dL (ref 0.2–1.2)
Bilirubin, Direct: 0.1 mg/dL (ref 0.0–0.2)
GLOBULIN: 2.3 g/dL (ref 1.9–3.7)
TOTAL PROTEIN: 6.9 g/dL (ref 6.1–8.1)
Total Bilirubin: 0.6 mg/dL (ref 0.2–1.2)

## 2017-11-07 LAB — LIPID PANEL
CHOL/HDL RATIO: 4 (calc) (ref <5.0)
CHOLESTEROL: 221 mg/dL — AB (ref <200)
HDL: 55 mg/dL (ref >50)
LDL CHOLESTEROL (CALC): 131 mg/dL — AB
Non-HDL Cholesterol (Calc): 166 mg/dL (calc) — ABNORMAL HIGH (ref <130)
TRIGLYCERIDES: 213 mg/dL — AB (ref <150)

## 2017-11-07 LAB — HEMOGLOBIN A1C
EAG (MMOL/L): 7 (calc)
Hgb A1c MFr Bld: 6 % of total Hgb — ABNORMAL HIGH (ref <5.7)
MEAN PLASMA GLUCOSE: 126 (calc)

## 2017-11-07 LAB — TSH: TSH: 2.32 m[IU]/L (ref 0.40–4.50)

## 2017-11-07 LAB — MAGNESIUM: MAGNESIUM: 1.9 mg/dL (ref 1.5–2.5)

## 2017-11-10 ENCOUNTER — Encounter: Payer: Self-pay | Admitting: Physician Assistant

## 2017-11-10 MED ORDER — EZETIMIBE 10 MG PO TABS: 10.0000 mg | ORAL_TABLET | Freq: Every day | ORAL | 0 refills | Status: DC

## 2017-11-17 ENCOUNTER — Other Ambulatory Visit: Payer: Self-pay | Admitting: Internal Medicine

## 2017-12-10 ENCOUNTER — Other Ambulatory Visit: Payer: Self-pay | Admitting: Internal Medicine

## 2017-12-10 NOTE — Telephone Encounter (Signed)
°*  STAT* If patient is at the pharmacy, call can be transferred to refill team.   1. Which medications need to be refilled? (please list name of each medication and dose if known) Xarelto   2. Which pharmacy/location (including street and city if local pharmacy) is medication to be sent to?[facility] [address] [place]   3. Do they need a 30 day or 90 day supply? [address]

## 2017-12-11 MED ORDER — RIVAROXABAN 20 MG PO TABS: ORAL_TABLET | ORAL | 0 refills | Status: DC

## 2017-12-11 NOTE — Telephone Encounter (Signed)
Request for Xarelto 20mg  refill request received; pt is 67 yrs old, wt-67kg, Crea-0.75 on 11/06/17, last seen by Dr. Rayann Heman on 08/10/17,  CrCl-79.27ml/min. [facility] [address] [facility]/17 for a 3 month supply will call to confirm that they have RX before sending in another.  Called Pharmacy & spoke spoke with Tammy & she stated pt has a refill on rx & she has picked it up on 12/07/17, & not due for a new rx until April. Pt is using RX that was sent but only able to get 30 day supply at a time. Will send in refill so they can hold rx & fill when needed.

## 2017-12-28 ENCOUNTER — Encounter: Payer: Self-pay | Admitting: Physician Assistant

## 2017-12-28 MED ORDER — PREDNISONE 20 MG PO TABS: ORAL_TABLET | ORAL | 0 refills | Status: DC

## 2017-12-29 MED ORDER — FLUTICASONE-SALMETEROL 250-50 MCG/DOSE IN AEPB: 1.0000 | INHALATION_SPRAY | Freq: Two times a day (BID) | RESPIRATORY_TRACT | 4 refills | Status: DC

## 2017-12-29 NOTE — Addendum Note (Signed)
Addended by: Vicie Mutters R on: 12/29/2017 10:54 AM   Modules accepted: Orders

## 2018-01-04 ENCOUNTER — Encounter: Payer: Self-pay | Admitting: Physician Assistant

## 2018-01-05 ENCOUNTER — Encounter: Payer: Self-pay | Admitting: Physician Assistant

## 2018-01-05 ENCOUNTER — Other Ambulatory Visit: Payer: Self-pay | Admitting: Physician Assistant

## 2018-01-05 MED ORDER — AZITHROMYCIN 250 MG PO TABS: ORAL_TABLET | ORAL | 1 refills | Status: AC

## 2018-01-25 ENCOUNTER — Other Ambulatory Visit: Payer: Self-pay | Admitting: Internal Medicine

## 2018-02-09 ENCOUNTER — Encounter: Payer: Self-pay | Admitting: Internal Medicine

## 2018-02-09 ENCOUNTER — Encounter: Payer: Self-pay | Admitting: Physician Assistant

## 2018-02-09 ENCOUNTER — Encounter (INDEPENDENT_AMBULATORY_CARE_PROVIDER_SITE_OTHER): Payer: Self-pay

## 2018-02-16 ENCOUNTER — Other Ambulatory Visit: Payer: Self-pay | Admitting: Physician Assistant

## 2018-04-04 ENCOUNTER — Other Ambulatory Visit: Payer: Self-pay | Admitting: Physician Assistant

## 2018-04-15 ENCOUNTER — Telehealth: Payer: Self-pay | Admitting: Internal Medicine

## 2018-04-15 NOTE — Telephone Encounter (Addendum)
Returned call to Pt.  Advised per APP-Pt can take diltiazem 30 mg one capsule by mouth q 4 to 6 hours with a maximum of 4 doses in a 24 hour period.  Pt should monitor blood pressure while taking extra doses.  Advised if this didn't seem to help would have her seen in the afib clinic asap.  Advised to return this nurse call if extra diltiziam not effective in controlling heart rate.

## 2018-04-15 NOTE — Telephone Encounter (Signed)
New Message   Pt c/o medication issue:  1. Name of Medication: diltiazem (CARDIZEM) 30 MG tablet  2. How are you currently taking this medication (dosage and times per day)? Take 1 tablet (30 mg total) by mouth as needed (fast palpitations  3. Are you having a reaction (difficulty breathing--STAT)? no  4. What is your medication issue? Pt states that she took 3 pills this morning because her hr was 125 and it went down but now its back up to 150 and she wants to know if she can take more. Please call

## 2018-04-16 ENCOUNTER — Telehealth: Payer: Self-pay | Admitting: Internal Medicine

## 2018-04-16 NOTE — Telephone Encounter (Signed)
New Messages  Patient c/o Palpitations:  High priority if patient c/o lightheadedness, shortness of breath, or chest pain  1) How long have you had palpitations/irregular HR/ Afib? Are you having the symptoms now? Yes   2) Are you currently experiencing lightheadedness, SOB or CP? No , just not feeling well   3) Do you have a history of afib (atrial fibrillation) or irregular heart rhythm? yes  4) Have you checked your BP or HR? (document readings if available):  Hr is 110  139/74   5) Are you experiencing any other symptoms? Just not feeling well

## 2018-04-16 NOTE — Telephone Encounter (Signed)
Spoke with Kathryn Booker who states she continues to have fast HR. States she has followed advice of nurse given yesterday (see telephone encounter from 7/11) and has taken additional Diltiazem 30 mg, not to exceed 4 doses in 24 hours.  She takes Diltiazem 120 mg at bedtime. She states HR has improved since yesterday and is 111 bpm and 106 bpm today. States earlier today BP was 139/74 mmHg. States she has had 3 Diltiazem 30 mg today and now BP 116/76 mmHg. We discussed options for follow-up and she prefers not to go to [facility] clinic due to cost. She has an appointment with Dr. Rayann Heman in October but would like to see someone sooner for medication adjustment. I reviewed  caffeine intake with Kathryn Booker and she admits to drinking more decaffeinated beverages over the past few days so she will decrease those and increase water intake. I advised that I will forward message to EP scheduler for help with an appointment and advised her to call back with questions or concerns. She verbalized understanding and agreement and will continue current medications. She thanked me for the call.

## 2018-04-16 NOTE — Telephone Encounter (Signed)
She can see Amber or Renae next week. GT

## 2018-04-19 ENCOUNTER — Encounter: Payer: Self-pay | Admitting: Internal Medicine

## 2018-04-19 ENCOUNTER — Other Ambulatory Visit: Payer: Self-pay | Admitting: Internal Medicine

## 2018-04-20 NOTE — Progress Notes (Signed)
Electrophysiology Office Note Date: 04/21/2018  ID:  Kathryn Booker, DOB Jun 08, 1951, MRN [id number]  PCP: Kathryn Mutters, PA-C Primary Cardiologist: Kathryn Booker Electrophysiologist: Allred  CC: AF follow up  Kathryn Booker is a 67 y.o. female seen today for Dr Rayann Heman.  She presents today for routine electrophysiology followup.  Since last being seen in our clinic, the patient reports doing relatively well.  She had a few days last week with increased AF burden. She took extra Diltiazem with good results.  She denies chest pain, dyspnea, PND, orthopnea, nausea, vomiting, dizziness, syncope, edema, weight gain, or early satiety.  Echo 12/17 demonstrated normal LVEF, grade 1 diastolic dysfunction, no RWMA, mild AS, LA 39.   Atrial Fibrillation Risk Factors:  she does not have symptoms or diagnosis of sleep apnea.  she does not have a history of rheumatic fever.  she does have a history of alcohol use - wine occasionally   she has a BMI of Body mass index is 25.97 kg/m.  LA size: 39   Atrial Fibrillation Management history:  Previous antiarrhythmic drugs: none  Previous cardioversions: none  Previous ablations: none  CHADS2VASC score: 2  Anticoagulation history: none      Past Medical History:  Diagnosis Date  . Anxiety   . Asthma   . COPD (chronic obstructive pulmonary disease) ([place])   . Coronary artery calcification seen on CT scan   . Depression   . Fatigue   . Former tobacco use   . Hyperlipidemia   . Paroxysmal atrial fibrillation (HCC)   . Pre-diabetes   . RBBB (right bundle branch block with left anterior fascicular block)   . Scoliosis   . Serum calcium elevated   . Thyroid disease    Past Surgical History:  Procedure Laterality Date  . APPENDECTOMY    . BREAST EXCISIONAL BIOPSY Right 2017   benign  . LAPROSCOPIC     X 2 IN EARLY 30-40'S  . OVARIAN CYST REMOVAL    . RADIOACTIVE SEED GUIDED EXCISIONAL BREAST BIOPSY Right 05/15/2016     Procedure: RIGHT RADIOACTIVE SEED GUIDED EXCISIONAL BREAST BIOPSY;  Surgeon: Rolm Bookbinder, MD;  Location: [facility];  Service: General;  Laterality: Right;  RIGHT RADIOACTIVE SEED GUIDED EXCISIONAL BREAST BIOPSY  . TONSILECTOMY, ADENOIDECTOMY, BILATERAL MYRINGOTOMY AND TUBES      Current Outpatient Medications  Medication Sig Dispense Refill  . albuterol (PROAIR HFA) 108 (90 Base) MCG/ACT inhaler INHALE 2 PUFFS INTO THE LUNGS EVERY 6 HOURS AS NEEDED FOR WHEEZING OR SHORTNESS OF BREATH. 25.5 Inhaler 0  . buPROPion (WELLBUTRIN XL) 300 MG 24 hr tablet Take 1 tablet (300 mg total) by mouth daily. 90 tablet 1  . Cholecalciferol (VITAMIN D PO) Take 1,000 Units by mouth daily.     . citalopram (CELEXA) 40 MG tablet Take 1 tablet (40 mg total) by mouth daily. 90 tablet 1  . diltiazem (CARDIZEM CD) 120 MG 24 hr capsule Take 1 capsule (120 mg total) by mouth daily. Please keep upcoming appt for future refills. Thank you 90 capsule 0  . diltiazem (CARDIZEM) 30 MG tablet Take 1 tablet (30 mg total) by mouth as needed (fast palpitations). 90 tablet 0  . ezetimibe (ZETIA) 10 MG tablet TAKE 1 TABLET BY MOUTH DAILY 90 tablet 0  . Fluticasone-Salmeterol (ADVAIR) 250-50 MCG/DOSE AEPB Inhale 1 puff into the lungs 2 (two) times daily. 180 each 4  . guaiFENesin (MUCINEX) 600 MG 12 hr tablet Take 600 mg by mouth  2 (two) times daily.     . Magnesium 250 MG TABS Take 1 tablet by mouth daily.     . magnesium oxide (MAG-OX) 400 MG tablet Take 400 mg by mouth daily.    . montelukast (SINGULAIR) 10 MG tablet Take 1 tablet (10 mg total) by mouth at bedtime. 90 tablet 1  . Multiple Vitamins-Minerals (MULTIVITAMIN WITH MINERALS) tablet Take 1 tablet by mouth daily.    [name] OVER THE COUNTER MEDICATION Eye drop 1 drop each eye daily.    . predniSONE (DELTASONE) 20 MG tablet 2 tablets daily for 3 days, 1 tablet daily for 4 days. 10 tablet 0  . valACYclovir (VALTREX) 500 MG tablet Take 1 tablet (500 mg total)  by mouth daily. 90 tablet 1  . valACYclovir (VALTREX) 500 MG tablet TAKE 1 TABLET BY MOUTH DAILY 90 tablet 1  . XARELTO 20 MG TABS tablet TAKE 1 TABLET BY MOUTH DAILY WITH SUPPER 90 tablet 1   No current facility-administered medications for this visit.     Allergies:   Lipitor [atorvastatin] and Tetanus toxoids   Social History: Social History   Socioeconomic History  . Marital status: Divorced    Spouse name: Not on file  . Number of children: Not on file  . Years of education: Not on file  . Highest education level: Not on file  Occupational History  . Not on file  Social Needs  . Financial resource strain: Not on file  . Food insecurity:    Worry: Not on file    Inability: Not on file  . Transportation needs:    Medical: Not on file    Non-medical: Not on file  Tobacco Use  . Smoking status: Former Smoker    Packs/day: 2.00    Years: 35.00    Pack years: 70.00    Types: Cigarettes    Last attempt to quit: 11/19/2013    Years since quitting: 4.4  . Smokeless tobacco: Never Used  Substance and Sexual Activity  . Alcohol use: Yes    Alcohol/week: 0.0 oz    Comment: social  . Drug use: No  . Sexual activity: Not on file  Lifestyle  . Physical activity:    Days per week: Not on file    Minutes per session: Not on file  . Stress: Not on file  Relationships  . Social connections:    Talks on phone: Not on file    Gets together: Not on file    Attends religious service: Not on file    Active member of club or organization: Not on file    Attends meetings of clubs or organizations: Not on file    Relationship status: Not on file  . Intimate partner violence:    Fear of current or ex partner: Not on file    Emotionally abused: Not on file    Physically abused: Not on file    Forced sexual activity: Not on file  Other Topics Concern  . Not on file  Social History Narrative   Divorced   No family   Works at [employer] more   Smokes 1.5 ppd     Sedentary   Stress in life    Family History: Family History  Problem Relation Age of Onset  . Heart failure Mother   . Hypertension Mother   . Hyperlipidemia Mother   . Heart disease Mother   . Liver disease Father   . Alcohol abuse Father  Review of Systems: All other systems reviewed and are otherwise negative except as noted above.   Physical Exam: VS:  BP 110/68   Pulse 61   Ht 5\' 3"  (1.6 m)   Wt 146 lb 9.6 oz (66.5 kg)   SpO2 94%   BMI 25.97 kg/m  , BMI Body mass index is 25.97 kg/m. Wt Readings from Last 3 Encounters:  04/21/18 146 lb 9.6 oz (66.5 kg)  11/06/17 145 lb 3.2 oz (65.9 kg)  08/10/17 147 lb 12.8 oz (67 kg)    GEN- The patient is well appearing, alert and oriented x 3 today.   HEENT: normocephalic, atraumatic; sclera clear, conjunctiva pink; hearing intact; oropharynx clear; neck supple Lungs- Clear to ausculation bilaterally, normal work of breathing.  No wheezes, rales, rhonchi Heart- Regular rate and rhythm  GI- soft, non-tender, non-distended, bowel sounds present  Extremities- no clubbing, cyanosis, or edema  MS- no significant deformity or atrophy Skin- warm and dry, no rash or lesion  Psych- euthymic mood, full affect Neuro- strength and sensation are intact   EKG:  EKG is not ordered today.  Recent Labs: 11/06/2017: ALT 27; BUN 17; Creat 0.75; Hemoglobin 14.6; Magnesium 1.9; Platelets 257; Potassium 5.0; Sodium 139; TSH 2.32    Other studies Reviewed: Additional studies/ records that were reviewed today include: Dr Jackalyn Lombard office notes  Assessment and Plan:  1.  Paroxysmal atrial fibrillation Medical therapy limited by baseline bradycardia and underlying RBBB Continue Xarelto for CHADS2VASC of 2 We discussed treatment options today including increasing maintenance dose of Diltiazem to 180mg  daily, continuing current course, or considering AAD therapy ([facility] would be best option) She would like to continue current therapy for  now If she has recurrent AF episodes, can increase Diltiazem to 180mg  daily and repeat EKG 1 week after increase     Current medicines are reviewed at length with the patient today.   The patient does not have concerns regarding her medicines.  The following changes were made today:  none  Labs/ tests ordered today include: none No orders of the defined types were placed in this encounter.    Disposition:   Follow up with Dr Rayann Heman as scheduled    Signed, [name], NP 04/21/2018 10:24 AM   [address] [place] [address] [phone] (office) [phone] (fax)

## 2018-04-21 ENCOUNTER — Encounter: Payer: Self-pay | Admitting: Nurse Practitioner

## 2018-04-21 ENCOUNTER — Ambulatory Visit: Payer: BC Managed Care – PPO | Admitting: Nurse Practitioner

## 2018-04-21 VITALS — BP 110/68 | HR 61 | Ht 63.0 in | Wt 146.6 lb

## 2018-04-21 DIAGNOSIS — I452 Bifascicular block: Secondary | ICD-10-CM

## 2018-04-21 DIAGNOSIS — E6609 Other obesity due to excess calories: Secondary | ICD-10-CM | POA: Diagnosis not present

## 2018-04-21 DIAGNOSIS — I480 Paroxysmal atrial fibrillation: Secondary | ICD-10-CM

## 2018-04-21 NOTE — Patient Instructions (Addendum)
Medication Instructions:   Your physician recommends that you continue on your current medications as directed. Please refer to the Current Medication list given to you today.   If you need a refill on your cardiac medications before your next appointment, please call your pharmacy.  Labwork: NONE ORDERED  TODAY    Testing/Procedures: NONE ORDERED  TODAY    Follow-Up: KEEP APPT AS SCHEULED   Any Other Special Instructions Will Be Listed Below (If Applicable).

## 2018-04-28 ENCOUNTER — Other Ambulatory Visit: Payer: BC Managed Care – PPO

## 2018-04-29 ENCOUNTER — Other Ambulatory Visit: Payer: Self-pay | Admitting: Adult Health

## 2018-04-29 ENCOUNTER — Encounter: Payer: Self-pay | Admitting: Physician Assistant

## 2018-04-29 MED ORDER — AZELASTINE HCL 0.1 % NA SOLN
2.0000 | Freq: Two times a day (BID) | NASAL | 2 refills | Status: DC
Start: 2018-04-29 — End: 2019-01-25

## 2018-05-10 ENCOUNTER — Ambulatory Visit: Payer: BC Managed Care – PPO

## 2018-05-10 ENCOUNTER — Ambulatory Visit
Admission: RE | Admit: 2018-05-10 | Discharge: 2018-05-10 | Disposition: A | Discharge status: Home / Self Care | Payer: BC Managed Care – PPO | Source: Ambulatory Visit | Attending: Physician Assistant | Admitting: Physician Assistant

## 2018-05-10 DIAGNOSIS — N6489 Other specified disorders of breast: Secondary | ICD-10-CM

## 2018-05-11 ENCOUNTER — Encounter: Payer: Self-pay | Admitting: Physician Assistant

## 2018-05-17 ENCOUNTER — Other Ambulatory Visit: Payer: Self-pay | Admitting: Physician Assistant

## 2018-05-24 NOTE — Progress Notes (Signed)
Complete Physical  Assessment and Plan:  Hypothyroidism, unspecified hypothyroidism type Hypothyroidism-check TSH level, continue medications the same, reminded to take on an empty stomach 30-69mins before food.  -     TSH  Hyperlipidemia -continue medications, check lipids, decrease fatty foods, increase activity.  -     Lipid panel -     Urinalysis, Routine w reflex microscopic (not at [facility]) -     Microalbumin / creatinine urine ratio  Chronic obstructive pulmonary disease, unspecified COPD type ([place]) Continue medications.   Prediabetes -     Hemoglobin A1c  Vitamin D deficiency -     VITAMIN D 25 Hydroxy (Vit-D Deficiency, Fractures)  RBBB (right bundle branch block with left anterior fascicular block) No changes  Asthma, mild intermittent, uncomplicated Continue medications  At risk for coronary artery disease Control blood pressure, cholesterol, glucose, increase exercise.   Anxiety continue medications, stress management techniques discussed, increase water, good sleep hygiene discussed, increase exercise, and increase veggies.   Osteopenia Osteopenia-  continue Vit D and Ca, weight bearing exercises  Medication management -     CBC with Differential/Platelet -     BASIC METABOLIC PANEL WITH GFR -     Hepatic function panel -     Magnesium  Encounter for general adult medical examination with abnormal findings Due next year Defer EKG, follows cards Colonoscopy- patient declines a colonoscopy even though the risks and benefits were discussed at length. Colon cancer is 3rd most diagnosed cancer and 2nd leading cause of death in both men and women 32 years of age and older. Patient understands the risk of cancer and death with declining the test however they are willing to do cologuard screening instead. They understand that this is not as sensitive or specific as a colonoscopy and they are still recommended to get a colonoscopy. The cologuard will be sent out to  their house.   Paroxysmal A-fib (HCC) Rate controlled Continue cardio follow up On xarelto  Recurrent major depressive disorder, in partial remission ([place]) Depression- continue medications, stress management techniques discussed, increase water, good sleep hygiene discussed, increase exercise, and increase veggies.   Rash left thigh ? Shingles versus eczema- will give cream, monitor- has had vaccine  Discussed med's effects and SE's. Screening labs and tests as requested with regular follow-up as recommended. Over 40 minutes of exam, counseling, chart review, and complex, high level critical decision making was performed this visit.   HPI  67 y.o. female  presents for a complete physical.  Going to retire end of August and go on medicare. She has been exercising and is very excited.  She has had itching and rash on her right lateral thigh x several weeks, no pain, no where else.    Her blood pressure has been controlled at home, today their BP is BP: 120/60  BP Readings from Last 3 Encounters:  05/27/18 120/60  04/21/18 110/68  11/06/17 130/74    She does not workout, trying to walk. She denies chest pain, shortness of breath, dizziness.   She has COPD, is on advair, She quit smoking Feb 2015, she smoked for 40 + years, last CXR Jan 2016.   She has Afib recently, following with Dr. [name], on xarelto and cardizem.  Has not had sleep study, insurance will not cover it.   She is on cholesterol medication due to myalgias with statins, but has tolerated livalo.  Her cholesterol is at goal. The cholesterol last visit was:   Lab Results  Component Value Date   CHOL 221 (H) 11/06/2017   HDL 55 11/06/2017   LDLCALC 131 (H) 11/06/2017   TRIG 213 (H) 11/06/2017   CHOLHDL 4.0 11/06/2017   She has been working on diet and exercise for prediabetes,  and denies paresthesia of the feet, polydipsia, polyuria and visual disturbances. Last A1C in the office was:  Lab Results  Component  Value Date   HGBA1C 6.0 (H) 11/06/2017   Patient is on Vitamin D supplement, 1000 IU daily   Lab Results  Component Value Date   VD25OH 60 05/02/2016     She is on celexa and wellbutrin for anxiety that helps.   BMI is Body mass index is 26.29 kg/m., she is working on diet and exercise. Wt Readings from Last 3 Encounters:  05/27/18 148 lb 6.4 oz (67.3 kg)  04/21/18 146 lb 9.6 oz (66.5 kg)  11/06/17 145 lb 3.2 oz (65.9 kg)     Current Medications:    Current Outpatient Medications (Cardiovascular):  .  diltiazem (CARDIZEM CD) 120 MG 24 hr capsule, Take 1 capsule (120 mg total) by mouth daily. Please keep upcoming appt for future refills. Thank you .  diltiazem (CARDIZEM) 30 MG tablet, Take 1 tablet (30 mg total) by mouth as needed (fast palpitations). .  ezetimibe (ZETIA) 10 MG tablet, Take 1 tablet (10 mg total) by mouth daily.  Current Outpatient Medications (Respiratory):  .  albuterol (PROAIR HFA) 108 (90 Base) MCG/ACT inhaler, INHALE 2 PUFFS INTO THE LUNGS EVERY 6 HOURS AS NEEDED FOR WHEEZING OR SHORTNESS OF BREATH. [name]  azelastine (ASTELIN) 0.1 % nasal spray, Place 2 sprays into both nostrils 2 (two) times daily. Use in each nostril as directed .  Fluticasone-Salmeterol (ADVAIR) 250-50 MCG/DOSE AEPB, Inhale 1 puff into the lungs 2 (two) times daily. [name]  guaiFENesin (MUCINEX) 600 MG 12 hr tablet, Take 600 mg by mouth 2 (two) times daily.  .  montelukast (SINGULAIR) 10 MG tablet, Take 1 tablet (10 mg total) by mouth at bedtime.   Current Outpatient Medications (Hematological):  .  rivaroxaban (XARELTO) 20 MG TABS tablet, TAKE 1 TABLET BY MOUTH DAILY WITH SUPPER  Current Outpatient Medications (Other):  [name]  buPROPion (WELLBUTRIN XL) 300 MG 24 hr tablet, Take 1 tablet (300 mg total) by mouth daily. .  Cholecalciferol (VITAMIN D PO), Take 1,000 Units by mouth daily.  .  citalopram (CELEXA) 40 MG tablet, Take 1 tablet (40 mg total) by mouth daily. .  magnesium oxide (MAG-OX) 400  MG tablet, Take 400 mg by mouth daily. .  Multiple Vitamins-Minerals (MULTIVITAMIN WITH MINERALS) tablet, Take 1 tablet by mouth daily. [name]  OVER THE COUNTER MEDICATION, Eye drop 1 drop each eye daily. .  valACYclovir (VALTREX) 500 MG tablet, Take 1 tablet (500 mg total) by mouth daily. [name]  nystatin cream (MYCOSTATIN), Apply 1 application topically 2 (two) times daily. [name]  triamcinolone cream (KENALOG) 0.5 %, Apply 1 application topically 2 (two) times daily.  Health Maintenance:   Immunization History  Administered Date(s) Administered  . PPD Test 01/26/2014  . Pneumococcal Conjugate-13 05/06/2017  . Pneumococcal-Unspecified 12/25/2011  . Td 07/22/2006  . Tdap 05/02/2016  . Zoster 01/26/2014   Tdap:07/22/2016 Pneumovax:12/25/11 Prevnar 13 2018 Zostavax:01/26/14 Influenza: 2018  Colonoscopy: 2008 GOING TO GET WHEN RETIRES WITH MEDICARE Mammo: 05/10/2018 BMD: 2016 will get next [facility] Pap/ Pelvic: Nov 2015   CXR: 07/2015 Ct cardiac score 2013 CT lumbar 2011 Echo 2017 PFTs 09/2016  [id number] Dentist:q 6 months Patient  Care Team: Vicie Mutters, PA-C as PCP - General (Physician Assistant) Josue Hector, MD as Consulting Physician (Cardiology) Inda Castle, MD (Inactive) as Consulting Physician (Gastroenterology) Lavonna Monarch, MD as Consulting Physician (Dermatology) Marygrace Drought, MD as Consulting Physician (Ophthalmology) Rolm Bookbinder, MD as Consulting Physician (General Surgery)  Medical History:  Past Medical History:  Diagnosis Date  . Anxiety   . Asthma   . COPD (chronic obstructive pulmonary disease) ([place])   . Coronary artery calcification seen on CT scan   . Depression   . Fatigue   . Former tobacco use   . Hyperlipidemia   . Paroxysmal atrial fibrillation (HCC)   . Pre-diabetes   . RBBB (right bundle branch block with left anterior fascicular block)   . Scoliosis   . Serum calcium elevated   . Thyroid disease    Allergies Allergies  Allergen  Reactions  . Lipitor [Atorvastatin]     Myalgias   . Tetanus Toxoids Itching    SURGICAL HISTORY She  has a past surgical history that includes Tonsilectomy, adenoidectomy, bilateral myringotomy and tubes; Ovarian cyst removal; Appendectomy; LAPROSCOPIC; Radioactive seed guided excisional breast biopsy (Right, 05/15/2016); and Breast excisional biopsy (Right, 2017). FAMILY HISTORY Her family history includes Alcohol abuse in her father; Heart disease in her mother; Heart failure in her mother; Hyperlipidemia in her mother; Hypertension in her mother; Liver disease in her father. SOCIAL HISTORY  She  reports that she quit smoking about 4 years ago. Her smoking use included cigarettes. She has a 70.00 pack-year smoking history. She has never used smokeless tobacco. She reports that she drinks alcohol. She reports that she does not use drugs.  Review of Systems: Review of Systems  Constitutional: Negative.   HENT: Negative for congestion, ear discharge, ear pain, hearing loss, nosebleeds, sore throat and tinnitus.   Respiratory: Negative for cough, hemoptysis, sputum production, shortness of breath, wheezing and stridor.   Gastrointestinal: Positive for heartburn. Negative for abdominal pain, blood in stool, constipation, diarrhea, melena, nausea and vomiting.  Genitourinary: Negative for dysuria, flank pain, frequency, hematuria and urgency.       + incontinence  Musculoskeletal: Negative.   Skin: Negative.   Neurological: Negative.  Negative for headaches.  Psychiatric/Behavioral: Negative.     Physical Exam: Estimated body mass index is 26.29 kg/m as calculated from the following:   Height as of this encounter: 5\' 3"  (1.6 m).   Weight as of this encounter: 148 lb 6.4 oz (67.3 kg). BP 120/60   Pulse (!) 59   Temp (!) 97.5 F (36.4 C)   Resp 14   Ht 5\' 3"  (1.6 m)   Wt 148 lb 6.4 oz (67.3 kg)   SpO2 98%   BMI 26.29 kg/m  General Appearance: Well nourished, in no apparent  distress.  Eyes: PERRLA, EOMs, conjunctiva no swelling or erythema, normal fundi and vessels.  Sinuses: No Frontal/maxillary tenderness  ENT/Mouth: Ext aud canals clear, normal light reflex with TMs without erythema, bulging. Good dentition. No erythema, swelling, or exudate on post pharynx. Tonsils not swollen or erythematous. Hearing normal.  Neck: Supple, thyroid normal. No bruits  Respiratory: Respiratory effort normal, BS equal bilaterally without rales, rhonchi, wheezing or stridor.  Cardio: RRR without murmurs, rubs or gallops. Brisk peripheral pulses without edema.  Chest: symmetric, with normal excursions and percussion.  Breasts: Symmetric, without lumps, nipple discharge, retractions.  Abdomen: Soft, nontender, no guarding, rebound, hernias, masses, or organomegaly.  Lymphatics: Non tender without lymphadenopathy.  Genitourinary: defer next  year Musculoskeletal: Full ROM all peripheral extremities,5/5 strength, and normal gait.  Skin: Warm, dry without rashes, lesions, ecchymosis. Neuro: Cranial nerves intact, reflexes equal bilaterally. Normal muscle tone, no cerebellar symptoms. Sensation intact.  Psych: Awake and oriented X 3, normal affect, Insight and Judgment appropriate.   EKG: defer cardio AORTA SCAN: defer  Vicie Mutters 12:45 PM [facility]

## 2018-05-27 ENCOUNTER — Ambulatory Visit: Payer: BC Managed Care – PPO | Admitting: Physician Assistant

## 2018-05-27 ENCOUNTER — Encounter: Payer: Self-pay | Admitting: Physician Assistant

## 2018-05-27 VITALS — BP 120/60 | HR 59 | Temp 97.5°F | Resp 14 | Ht 63.0 in | Wt 148.4 lb

## 2018-05-27 DIAGNOSIS — R7303 Prediabetes: Secondary | ICD-10-CM

## 2018-05-27 DIAGNOSIS — J45909 Unspecified asthma, uncomplicated: Secondary | ICD-10-CM

## 2018-05-27 DIAGNOSIS — E782 Mixed hyperlipidemia: Secondary | ICD-10-CM

## 2018-05-27 DIAGNOSIS — Z79899 Other long term (current) drug therapy: Secondary | ICD-10-CM

## 2018-05-27 DIAGNOSIS — M8580 Other specified disorders of bone density and structure, unspecified site: Secondary | ICD-10-CM

## 2018-05-27 DIAGNOSIS — J301 Allergic rhinitis due to pollen: Secondary | ICD-10-CM

## 2018-05-27 DIAGNOSIS — F3341 Major depressive disorder, recurrent, in partial remission: Secondary | ICD-10-CM

## 2018-05-27 DIAGNOSIS — Z1329 Encounter for screening for other suspected endocrine disorder: Secondary | ICD-10-CM

## 2018-05-27 DIAGNOSIS — Z0000 Encounter for general adult medical examination without abnormal findings: Secondary | ICD-10-CM

## 2018-05-27 DIAGNOSIS — E039 Hypothyroidism, unspecified: Secondary | ICD-10-CM

## 2018-05-27 DIAGNOSIS — E559 Vitamin D deficiency, unspecified: Secondary | ICD-10-CM

## 2018-05-27 DIAGNOSIS — Z6826 Body mass index (BMI) 26.0-26.9, adult: Secondary | ICD-10-CM

## 2018-05-27 DIAGNOSIS — R21 Rash and other nonspecific skin eruption: Secondary | ICD-10-CM

## 2018-05-27 DIAGNOSIS — Z13220 Encounter for screening for lipoid disorders: Secondary | ICD-10-CM

## 2018-05-27 DIAGNOSIS — I10 Essential (primary) hypertension: Secondary | ICD-10-CM

## 2018-05-27 DIAGNOSIS — F419 Anxiety disorder, unspecified: Secondary | ICD-10-CM

## 2018-05-27 DIAGNOSIS — I2510 Atherosclerotic heart disease of native coronary artery without angina pectoris: Secondary | ICD-10-CM

## 2018-05-27 DIAGNOSIS — I480 Paroxysmal atrial fibrillation: Secondary | ICD-10-CM

## 2018-05-27 DIAGNOSIS — Z1389 Encounter for screening for other disorder: Secondary | ICD-10-CM

## 2018-05-27 DIAGNOSIS — I452 Bifascicular block: Secondary | ICD-10-CM

## 2018-05-27 DIAGNOSIS — Z131 Encounter for screening for diabetes mellitus: Secondary | ICD-10-CM

## 2018-05-27 MED ORDER — EZETIMIBE 10 MG PO TABS: 10.0000 mg | ORAL_TABLET | Freq: Every day | ORAL | 1 refills | Status: DC

## 2018-05-27 MED ORDER — NYSTATIN 100000 UNIT/GM EX CREA: 1.0000 "application " | TOPICAL_CREAM | Freq: Two times a day (BID) | CUTANEOUS | 1 refills | Status: DC

## 2018-05-27 MED ORDER — MONTELUKAST SODIUM 10 MG PO TABS: 10.0000 mg | ORAL_TABLET | Freq: Every day | ORAL | 1 refills | Status: DC

## 2018-05-27 MED ORDER — TRIAMCINOLONE ACETONIDE 0.5 % EX CREA: 1.0000 "application " | TOPICAL_CREAM | Freq: Two times a day (BID) | CUTANEOUS | 2 refills | Status: DC

## 2018-05-27 MED ORDER — VALACYCLOVIR HCL 500 MG PO TABS: 500.0000 mg | ORAL_TABLET | Freq: Every day | ORAL | 1 refills | Status: DC

## 2018-05-27 MED ORDER — CITALOPRAM HYDROBROMIDE 40 MG PO TABS: 40.0000 mg | ORAL_TABLET | Freq: Every day | ORAL | 1 refills | Status: DC

## 2018-05-27 MED ORDER — BUPROPION HCL ER (XL) 300 MG PO TB24: 300.0000 mg | ORAL_TABLET | Freq: Every day | ORAL | 1 refills | Status: DC

## 2018-05-27 MED ORDER — DILTIAZEM HCL ER COATED BEADS 120 MG PO CP24: 120.0000 mg | ORAL_CAPSULE | Freq: Every day | ORAL | 0 refills | Status: DC

## 2018-05-27 MED ORDER — RIVAROXABAN 20 MG PO TABS: ORAL_TABLET | ORAL | 1 refills | Status: DC

## 2018-05-27 NOTE — Patient Instructions (Signed)
Ms. Kathryn Booker , Thank you for taking time to come for your Medicare Wellness Visit. I appreciate your ongoing commitment to your health goals. Please review the following plan we discussed and let me know if I can assist you in the future.   This is a list of the screening recommended for you and due dates:  Health Maintenance  Topic Date Due  . Colon Cancer Screening  09/06/2017  . Flu Shot  05/06/2018  . Pneumonia vaccines (2 of 2 - PPSV23) 05/06/2018  . Mammogram  10/20/2019  . Tetanus Vaccine  05/02/2026  . DEXA scan (bone density measurement)  Completed  .  Hepatitis C: One time screening is recommended by Center for Disease Control  (CDC) for  adults born from [birth year] through 1964.   Completed    If you have been in the ER, hospital, or long term care facility, we want to see you within one week of discharge so please CONTACT OUR OFFICE at [phone].  We will try to contact you if we know about your admission/visit but we would love for you to contact us first.  We know your history and want to make sure all of your questions are answered and needs are taken care of after admission.   We also want our patients to know that we do NOT approve of [facility] soliciting our patients to do home visits and we do NOT approve of [facility] screenings. Please contact us before doing either of these "services".   Check out  Mini habits for weight loss book  2 apps for tracking food is myfitness pal  loseit OR can take picture of your food  8 Critical Weight-Loss Tips That Aren't Diet and Exercise  1. STARVE THE DISTRACTIONS  All too often when we eat, we're also multitasking: watching TV, answering emails, scrolling through social media. These habits are detrimental to having a strong, clear, healthy relationship with food, and they can hinder our ability to make dietary changes.  In order to truly focus on what you're eating, how much you're eating, why you're eating those specific  foods and, most importantly, how those foods make you feel, you need to starve the distractions. That means when you eat, just eat. Focus on your food, the process it went through to end up on your plate, where it came from and how it nourishes you. With this technique, you're more likely to finish a meal feeling satiated.  2.  CONSIDER WHAT YOU'RE NOT WILLING TO DO  This might sound counterintuitive, but it can help provide a "why" when motivation is waning. Declare, in writing, what you are unwilling to do, for example "I am unwilling to be the old dad who cannot play sports with my children".  So consider what you're not willing to accept, write it down, and keep it at the ready.  3.  STOP LABELING FOOD "GOOD" AND "BAD"  You've probably heard someone say they ate something "bad." Maybe you've even said it yourself.  The trouble with 'bad' foods isn't that they'll send you to the grave after a bite or two. The trouble comes when we eat excessive portions of really calorie-dense foods meal after meal, day after day.  Instead of labeling foods as good or bad, think about which foods you can eat a lot of, and which ones you should just eat a little of. Then, plan ways to eat the foods you really like in portions that fit with your overall goals.  A good example of this would be having a slice of pizza alongside a club salad with chicken breast, avocado and a bit of dressing. This is vastly different than 3 slices of pizza, 4 breadsticks with cheese sauce and half of a liter of regular soda.  4.  BRUSH YOUR TEETH AFTER YOU EAT  Getting your mindset in order is important, but sometimes small habits can make a big difference. After eating, you still have the taste of food in their mouth, which often causes people to eat more even if they are full or engage in a nibble or two of dessert.  Brushing your teeth will remove the taste of food from your mouth, and the clean, minty freshness will serve as a cue  that mealtime is over.  5.  FOCUS ON CROWDING NOT CUTTING  The most common first step during 'dieting' is to cut. We cut our portion sizes down, we cut out 'bad' foods, we cut out entire food groups. This act of cutting puts [place] and our minds into scarcity mode.  When something is off-limits, even if you're able to avoid it for a while, you could end up bingeing on it later because you've gone so long without it. So, instead of cutting, focus on crowding. If you crowd your plate and fill it up with more foods like veggies and protein, it simply allows less room for the other stuff. In other words, shift your focus away from what you can't eat, and celebrate the foods that will help you reach your goals.  6.  TAKE TRACKING A STEP FURTHER  Track what you eat, when you ate it, how much you ate and how that food made you feel. Being completely honest with yourself and writing down every single thing that passes through your lips will help you start to notice that maybe you actually do snack, possibly take in more sugar than you thought, eat when you're bored rather than just hungry or maybe that you have a habit of snacking before bed while watching TV.  The difference from simply tracking your food intake is you're taking into account how food makes you feel, as well as what you're doing while you're eating. This is about becoming more mindful of what, when and why you eat.  7.  PRIORITIZE GOOD SLEEP  One of the strongest risk factors for being overweight is poor sleep. When you're feeling tired, you're more likely to choose unhealthy comfort foods and to skip your workout. Additionally, sleep deprivation may slow down your metabolism. [employer]! Therefore, sleeping 7-8 hours per night can help with weight loss without having to change your diet or increase your physical activity. And if you feel you snore and still wake up tired, talk with me about sleep apnea.  8.  SET ASIDE TIME TO DISCONNECT  Just  get out there. Disconnect from the electronics and connect to the elements. Not only will this help reduce stress (a major factor in weight gain) by giving your mind a break from the constant stimulation we've all become so accustomed to, but it may also reprogram your brain to connect with yourself and what you're feeling.

## 2018-05-28 LAB — URINALYSIS, ROUTINE W REFLEX MICROSCOPIC
BILIRUBIN URINE: NEGATIVE
GLUCOSE, UA: NEGATIVE
Hgb urine dipstick: NEGATIVE
KETONES UR: NEGATIVE
Leukocytes, UA: NEGATIVE
Nitrite: NEGATIVE
PH: 7 (ref 5.0–8.0)
Protein, ur: NEGATIVE
SPECIFIC GRAVITY, URINE: 1.011 (ref 1.001–1.03)

## 2018-05-28 LAB — CBC WITH DIFFERENTIAL/PLATELET
Basophils Absolute: 38 cells/uL (ref 0–200)
Basophils Relative: 0.6 %
EOS PCT: 1.9 %
Eosinophils Absolute: 120 cells/uL (ref 15–500)
HEMATOCRIT: 42.9 % (ref 35.0–45.0)
Hemoglobin: 14.6 g/dL (ref 11.7–15.5)
LYMPHS ABS: 1814 {cells}/uL (ref 850–3900)
MCH: 32.4 pg (ref 27.0–33.0)
MCHC: 34 g/dL (ref 32.0–36.0)
MCV: 95.1 fL (ref 80.0–100.0)
MPV: 10 fL (ref 7.5–12.5)
Monocytes Relative: 8.1 %
NEUTROS PCT: 60.6 %
Neutro Abs: 3818 cells/uL (ref 1500–7800)
Platelets: 249 10*3/uL (ref 140–400)
RBC: 4.51 10*6/uL (ref 3.80–5.10)
RDW: 12.3 % (ref 11.0–15.0)
Total Lymphocyte: 28.8 %
WBC mixed population: 510 cells/uL (ref 200–950)
WBC: 6.3 10*3/uL (ref 3.8–10.8)

## 2018-05-28 LAB — COMPLETE METABOLIC PANEL WITH GFR
AG RATIO: 1.9 (calc) (ref 1.0–2.5)
ALBUMIN MSPROF: 4.8 g/dL (ref 3.6–5.1)
ALKALINE PHOSPHATASE (APISO): 74 U/L (ref 33–130)
ALT: 35 U/L — ABNORMAL HIGH (ref 6–29)
AST: 23 U/L (ref 10–35)
BILIRUBIN TOTAL: 0.8 mg/dL (ref 0.2–1.2)
BUN: 16 mg/dL (ref 7–25)
CO2: 27 mmol/L (ref 20–32)
CREATININE: 0.91 mg/dL (ref 0.50–0.99)
Calcium: 10.8 mg/dL — ABNORMAL HIGH (ref 8.6–10.4)
Chloride: 103 mmol/L (ref 98–110)
GFR, Est African American: 76 mL/min/{1.73_m2} (ref >=60)
GFR, Est Non African American: 66 mL/min/{1.73_m2} (ref >=60)
GLOBULIN: 2.5 g/dL (ref 1.9–3.7)
Glucose, Bld: 112 mg/dL — ABNORMAL HIGH (ref 65–99)
Potassium: 5.6 mmol/L — ABNORMAL HIGH (ref 3.5–5.3)
SODIUM: 139 mmol/L (ref 135–146)
Total Protein: 7.3 g/dL (ref 6.1–8.1)

## 2018-05-28 LAB — HEMOGLOBIN A1C
EAG (MMOL/L): 6.8 (calc)
HEMOGLOBIN A1C: 5.9 %{Hb} — AB (ref <5.7)
MEAN PLASMA GLUCOSE: 123 (calc)

## 2018-05-28 LAB — LIPID PANEL
Cholesterol: 227 mg/dL — ABNORMAL HIGH (ref <200)
HDL: 60 mg/dL (ref >50)
LDL Cholesterol (Calc): 137 mg/dL (calc) — ABNORMAL HIGH
NON-HDL CHOLESTEROL (CALC): 167 mg/dL — AB (ref <130)
Total CHOL/HDL Ratio: 3.8 (calc) (ref <5.0)
Triglycerides: 160 mg/dL — ABNORMAL HIGH (ref <150)

## 2018-05-28 LAB — MICROALBUMIN / CREATININE URINE RATIO
Creatinine, Urine: 40 mg/dL (ref 20–275)
Microalb, Ur: <0.2 mg/dL

## 2018-05-28 LAB — MAGNESIUM: Magnesium: 2.1 mg/dL (ref 1.5–2.5)

## 2018-05-28 LAB — TSH: TSH: 2.09 mIU/L (ref 0.40–4.50)

## 2018-05-28 LAB — VITAMIN D 25 HYDROXY (VIT D DEFICIENCY, FRACTURES): Vit D, 25-Hydroxy: 51 ng/mL (ref 30–100)

## 2018-05-28 NOTE — Addendum Note (Signed)
Addended by: Vicie Mutters R on: 05/28/2018 08:51 AM   Modules accepted: Orders

## 2018-06-15 ENCOUNTER — Other Ambulatory Visit: Payer: BC Managed Care – PPO

## 2018-06-16 ENCOUNTER — Other Ambulatory Visit: Payer: Self-pay | Admitting: Physician Assistant

## 2018-06-16 LAB — PTH, INTACT AND CALCIUM
Calcium: 10.7 mg/dL — ABNORMAL HIGH (ref 8.6–10.4)
PTH: 62 pg/mL (ref 14–64)

## 2018-06-17 ENCOUNTER — Other Ambulatory Visit: Payer: Self-pay | Admitting: Acute Care

## 2018-06-17 DIAGNOSIS — Z122 Encounter for screening for malignant neoplasm of respiratory organs: Secondary | ICD-10-CM

## 2018-06-17 DIAGNOSIS — Z87891 Personal history of nicotine dependence: Secondary | ICD-10-CM

## 2018-06-22 ENCOUNTER — Encounter: Payer: Self-pay | Admitting: Internal Medicine

## 2018-06-29 ENCOUNTER — Other Ambulatory Visit: Payer: Self-pay | Admitting: Physician Assistant

## 2018-07-01 ENCOUNTER — Ambulatory Visit (INDEPENDENT_AMBULATORY_CARE_PROVIDER_SITE_OTHER)
Admission: RE | Admit: 2018-07-01 | Discharge: 2018-07-01 | Disposition: A | Discharge status: Home / Self Care | Payer: Medicare Other | Source: Ambulatory Visit | Attending: Acute Care | Admitting: Acute Care

## 2018-07-01 DIAGNOSIS — Z87891 Personal history of nicotine dependence: Secondary | ICD-10-CM | POA: Diagnosis not present

## 2018-07-01 DIAGNOSIS — Z122 Encounter for screening for malignant neoplasm of respiratory organs: Secondary | ICD-10-CM

## 2018-07-05 ENCOUNTER — Telehealth: Payer: Self-pay | Admitting: Pulmonary Disease

## 2018-07-05 DIAGNOSIS — Z122 Encounter for screening for malignant neoplasm of respiratory organs: Secondary | ICD-10-CM

## 2018-07-05 DIAGNOSIS — Z87891 Personal history of nicotine dependence: Secondary | ICD-10-CM

## 2018-07-07 NOTE — Telephone Encounter (Signed)
Pt is calling back for CT scan. Cb is [phone].

## 2018-07-08 NOTE — Telephone Encounter (Signed)
Routing message to Denise for her to follow up on. 

## 2018-07-09 ENCOUNTER — Encounter: Payer: Self-pay | Admitting: Physician Assistant

## 2018-07-09 ENCOUNTER — Other Ambulatory Visit: Payer: Self-pay | Admitting: Physician Assistant

## 2018-07-09 DIAGNOSIS — E348 Other specified endocrine disorders: Secondary | ICD-10-CM

## 2018-07-09 DIAGNOSIS — I700 Atherosclerosis of aorta: Secondary | ICD-10-CM | POA: Insufficient documentation

## 2018-07-09 NOTE — Telephone Encounter (Signed)
Pt informed of CT results per Sarah Groce, NP.  PT verbalized understanding.  Copy sent to PCP.  Order placed for 1 yr f/u CT.  

## 2018-07-12 ENCOUNTER — Ambulatory Visit: Payer: BC Managed Care – PPO | Admitting: Internal Medicine

## 2018-08-02 ENCOUNTER — Encounter: Payer: Self-pay | Admitting: Endocrinology

## 2018-08-02 ENCOUNTER — Other Ambulatory Visit: Payer: Self-pay | Admitting: Physician Assistant

## 2018-08-02 MED ORDER — FLUTICASONE-SALMETEROL 250-50 MCG/DOSE IN AEPB: INHALATION_SPRAY | RESPIRATORY_TRACT | 0 refills | Status: DC

## 2018-08-19 ENCOUNTER — Ambulatory Visit: Payer: Medicare Other | Admitting: Internal Medicine

## 2018-08-19 ENCOUNTER — Encounter: Payer: Self-pay | Admitting: Internal Medicine

## 2018-08-19 DIAGNOSIS — E213 Hyperparathyroidism, unspecified: Secondary | ICD-10-CM | POA: Diagnosis not present

## 2018-08-19 DIAGNOSIS — Z23 Encounter for immunization: Secondary | ICD-10-CM

## 2018-08-19 NOTE — Patient Instructions (Addendum)
Please stop the multivitamin.  Please come back for labs in 1 week.  Please collect a 24-hour urine right before returning for labs in 1 week and bring this with you.  Patient information (Up-to-Date): Collection of a 24-hour urine specimen  - You should collect every drop of urine during each 24-hour period. It does not matter how much or little urine is passed each time, as long as every drop is collected. - Begin the urine collection in the morning after you wake up, after you have emptied your bladder for the first time. - Urinate (empty the bladder) for the first time and flush it down the toilet. Note the exact time (eg, 6:15 AM). You will begin the urine collection at this time. - Collect every drop of urine during the day and night in an empty collection bottle. Store the bottle at room temperature or in the refrigerator. - If you need to have a bowel movement, any urine passed with the bowel movement should be collected. Try not to include feces with the urine collection. If feces does get mixed in, do not try to remove the feces from the urine collection bottle. - Finish by collecting the first urine passed the next morning, adding it to the collection bottle. This should be within ten minutes before or after the time of the first morning void on the first day (which was flushed). In this example, you would try to void between 6:05 and 6:25 on the second day. - If you need to urinate one hour before the final collection time, drink a full glass of water so that you can void again at the appropriate time. If you have to urinate 20 minutes before, try to hold the urine until the proper time. - Please note the exact time of the final collection, even if it is not the same time as when collection began on day 1. - The bottle(s) may be kept at room temperature for a day or two, but should be kept cool or refrigerated for longer periods of time.  We will schedule a new bone density.  Please  return in 4 months.

## 2018-08-19 NOTE — Progress Notes (Addendum)
Patient ID: Kathryn Booker, female   DOB: Jul 19, 1951, 67 y.o.   MRN: [id number]    HPI  Kathryn Booker is a 67 y.o.-year-old female, referred by her PCP, Vicie Mutters, PA-C, for evaluation for hypercalcemia. She saw Dr. Chalmers Cater and Dr. Buddy Duty before.  Pt was dx with hypercalcemia at least since 2009.  PTH levels were inappropriately normal.    She had a calcium level of 11 when on calcium supplements in the past.  I reviewed pt's pertinent labs: Lab Results  Component Value Date   PTH 62 06/15/2018   PTH 59 08/28/2015   CALCIUM 10.7 (H) 06/15/2018   CALCIUM 10.8 (H) 05/27/2018   CALCIUM 10.8 (H) 11/06/2017   CALCIUM 10.5 (H) 05/06/2017   CALCIUM 10.3 03/24/2017   CALCIUM 10.6 (H) 02/17/2017   CALCIUM 9.6 11/10/2016   CALCIUM 10.7 (H) 05/02/2016   CALCIUM 10.7 (H) 11/30/2015   CALCIUM 10.7 (H) 08/28/2015   Thyroid U/S (04/03/2008): R thyroid lobe slightly enlarged - 4.3 x 1.8 x 1.3 cm, L thyroid lobe - 3.7 x 1.1 x 1.4. Small hypoechoic area in R lobe 6 4 7  mm. Tiny cyst L lobe - 2 mm.  Parathyroid scan (2009-2010): negative  She saw Dr. Harlow Asa after the results were back but without a localizing lesion, he suggested against exploratory surgery.    I reviewed pt's DXA scan results from 2016-osteopenia: Date L1-L4 (L2) T score FN T score 33% distal Radius  05/29/2015 ([facility])  -1.9  LFN: -1.0 n/a   No fractures or falls.   No h/o kidney stones. Mother had kidney stones.  No h/o CKD. Last BUN/Cr: Lab Results  Component Value Date   BUN 16 05/27/2018   BUN 17 11/06/2017   CREATININE 0.91 05/27/2018   CREATININE 0.75 11/06/2017   Pt is not on HCTZ.  No h/o vitamin D deficiency. Reviewed vit D levels: Lab Results  Component Value Date   VD25OH 51 05/27/2018   VD25OH 60 05/02/2016   VD25OH 36 11/30/2015   VD25OH 37 08/17/2015   VD25OH 30 05/02/2015   VD25OH 40 05/18/2014   VD25OH 34 01/26/2014   VD25OH 40 10/24/2013   Pt is on multivitamin daily. Also on  vitamin D 4000 IU.  Pt does not have a FH of hypercalcemia, pituitary tumors, thyroid cancer, or osteoporosis.   Pt. also has a history of paroxystic Afib, asthma, hyperlipidemia, anxiety, depression.  ROS: Constitutional: no weight gain/loss, + fatigue, + subjective hyperthermia/no hypothermia, + nocturia, + excessive urination Eyes: no blurry vision, no xerophthalmia ENT: no sore throat, no nodules palpated in throat, no dysphagia/odynophagia, no hoarseness Cardiovascular: no CP/+ SOB/+ palpitations/no leg swelling Respiratory: + All: Cough/SOB/wheezing Gastrointestinal: no N/V/D/C Musculoskeletal: no muscle/joint aches Skin: + Rash on left thigh, itching Neurological: no tremors/numbness/tingling/dizziness Psychiatric: + Both: Depression/anxiety  Past Medical History:  Diagnosis Date  . Anxiety   . Asthma   . COPD (chronic obstructive pulmonary disease) ([place])   . Coronary artery calcification seen on CT scan   . Depression   . Fatigue   . Former tobacco use   . Hyperlipidemia   . Paroxysmal atrial fibrillation ([facility])   . Pre-diabetes   . RBBB (right bundle branch block with left anterior fascicular block)   . Scoliosis   . Serum calcium elevated   . Thyroid disease    Past Surgical History:  Procedure Laterality Date  . APPENDECTOMY    . BREAST EXCISIONAL BIOPSY Right 2017   benign  .  LAPROSCOPIC     X 2 IN EARLY 30-40'S  . OVARIAN CYST REMOVAL    . RADIOACTIVE SEED GUIDED EXCISIONAL BREAST BIOPSY Right 05/15/2016   Procedure: RIGHT RADIOACTIVE SEED GUIDED EXCISIONAL BREAST BIOPSY;  Surgeon: Rolm Bookbinder, MD;  Location: [facility];  Service: General;  Laterality: Right;  RIGHT RADIOACTIVE SEED GUIDED EXCISIONAL BREAST BIOPSY  . TONSILECTOMY, ADENOIDECTOMY, BILATERAL MYRINGOTOMY AND TUBES     Social History   Socioeconomic History  . Marital status: Divorced    Spouse name: Not on file  . Number of children: 0  . Years of education: Not on file   . Highest education level: Not on file  Occupational History  . n/a  Social Needs  . Financial resource strain: Not on file  . Food insecurity:    Worry: Not on file    Inability: Not on file  . Transportation needs:    Medical: Not on file    Non-medical: Not on file  Tobacco Use  . Smoking status: Former Smoker    Packs/day: 2.00    Years: 35.00    Pack years: 70.00    Types: Cigarettes    Last attempt to quit: 11/19/2013    Years since quitting: 4.7  . Smokeless tobacco: Never Used  Substance and Sexual Activity  . Alcohol use: Yes    Alcohol/week:  1 drink a week    Comment: social, wine, bourbon  . Drug use: No  Lifestyle  Relationships  Social History Narrative   Divorced   No family   Works at [employer] more   Smokes 1.5 ppd   Sedentary   Stress in life   Current Outpatient Medications on File Prior to Visit  Medication Sig Dispense Refill  . albuterol (PROAIR HFA) 108 (90 Base) MCG/ACT inhaler INHALE 2 PUFFS INTO THE LUNGS EVERY 6 HOURS AS NEEDED FOR WHEEZING OR SHORTNESS OF BREATH. 25.5 Inhaler 0  . azelastine (ASTELIN) 0.1 % nasal spray Place 2 sprays into both nostrils 2 (two) times daily. Use in each nostril as directed 30 mL 2  . buPROPion (WELLBUTRIN XL) 300 MG 24 hr tablet Take 1 tablet (300 mg total) by mouth daily. 90 tablet 1  . Cholecalciferol (VITAMIN D PO) Take 1,000 Units by mouth daily.     . citalopram (CELEXA) 40 MG tablet Take 1 tablet (40 mg total) by mouth daily. 90 tablet 1  . diltiazem (CARDIZEM CD) 120 MG 24 hr capsule Take 1 capsule (120 mg total) by mouth daily. Please keep upcoming appt for future refills. Thank you 90 capsule 0  . diltiazem (CARDIZEM) 30 MG tablet Take 1 tablet (30 mg total) by mouth as needed (fast palpitations). 90 tablet 0  . ezetimibe (ZETIA) 10 MG tablet Take 1 tablet (10 mg total) by mouth daily. 90 tablet 1  . Fluticasone-Salmeterol (ADVAIR DISKUS) 250-50 MCG/DOSE AEPB INHALE 1 PUFF INTO THE  LUNGS TWICE A DAY RINSE MOUTH AFTER INHALATION. 180 each 0  . magnesium oxide (MAG-OX) 400 MG tablet Take 400 mg by mouth daily.    . montelukast (SINGULAIR) 10 MG tablet Take 1 tablet (10 mg total) by mouth at bedtime. 90 tablet 1  . Multiple Vitamins-Minerals (MULTIVITAMIN WITH MINERALS) tablet Take 1 tablet by mouth daily.    [name] nystatin cream (MYCOSTATIN) Apply 1 application topically 2 (two) times daily. 30 g 1  . OVER THE COUNTER MEDICATION Eye drop 1 drop each eye daily.    . rivaroxaban (  XARELTO) 20 MG TABS tablet TAKE 1 TABLET BY MOUTH DAILY WITH SUPPER 90 tablet 1  . triamcinolone cream (KENALOG) 0.5 % Apply 1 application topically 2 (two) times daily. 80 g 2  . valACYclovir (VALTREX) 500 MG tablet Take 1 tablet (500 mg total) by mouth daily. 90 tablet 1  . guaiFENesin (MUCINEX) 600 MG 12 hr tablet Take 600 mg by mouth 2 (two) times daily.      No current facility-administered medications on file prior to visit.    Allergies  Allergen Reactions  . Lipitor [Atorvastatin]     Myalgias   . Tetanus Toxoids Itching   Family History  Problem Relation Age of Onset  . Heart failure Mother   . Hypertension Mother   . Hyperlipidemia Mother   . Heart disease Mother   . Liver disease Father   . Alcohol abuse Father     PE: BP 110/60   Pulse 67   Ht 5\' 3"  (1.6 m) Comment: measured  Wt 148 lb (67.1 kg)   SpO2 97%   BMI 26.22 kg/m  Wt Readings from Last 3 Encounters:  08/19/18 148 lb (67.1 kg)  05/27/18 148 lb 6.4 oz (67.3 kg)  04/21/18 146 lb 9.6 oz (66.5 kg)   Constitutional: overweight, in NAD. No kyphosis. Eyes: PERRLA, EOMI, no exophthalmos ENT: moist mucous membranes, no thyromegaly, no cervical lymphadenopathy Cardiovascular: RRR, No MRG Respiratory: CTA B Gastrointestinal: abdomen soft, NT, ND, BS+ Musculoskeletal: no deformities, strength intact in all 4 Skin: moist, warm, no rashes Neurological: no tremor with outstretched hands, DTR normal in all  4  Assessment: 1. Hypercalcemia with inappropriately normal PTH  Plan: Patient has had several instances of elevated calcium, with the highest level being at 11, although this was while she was taking calcium supplements.  PTH levels are normal, but inappropriately so in the presence of a high calcium.  Of note, she mentions that she had a high urinary calcium around the time of her diagnosis, at 600, but she does remember that she was taking calcium supplements at that time. - Her vitamin D levels have been normal in the last few years - No apparent complications from hypercalcemia: no h/o nephrolithiasis, no osteoporosis (but she does have osteopenia), no fractures. No abdominal pain, depression, bone pain. - I discussed with the patient about the physiology of calcium and parathyroid hormone, and possible side effects from increased PTH, including kidney stones, osteoporosis, abdominal pain, etc.  - We discussed that we need to check whether her hyperparathyroidism is primary (Familial hypercalcemic hypocalciuria or parathyroid adenoma) or secondary (to conditions like: vitamin D deficiency, calcium malabsorption, hypercalciuria, renal insufficiency, etc.). - We discussed that in the setting of a low vitamin D, the parathyroid hormone can be elevated, which is not a pathologic finding. However, if the PTH is elevated in the setting of a normal vitamin D, we will further need to investigate her for primary or secondary hyperparathyroidism.  Therefore, we will need to check the following labs (but I advised her to stop her multivitamin first and come back for labs in a week): calcium level intact PTH ([facility]) Magnesium Phosphorus vitamin D- 25 HO and 1,25 HO We will also repeat a 24h urinary calcium/creatinine ratio 1 week after she stops calcium- given instructions for urine collection - We discussed possible consequences of hyperparathyroidism: ~1/3 pts will develop complications over 15 years  (OP, nephrolithiasis).  - If the tests indicate a parathyroid adenoma, she agrees with a referral to surgery.  However, due to previously negative investigation, I would prefer to obtain a technetium sestamibi scan first, followed by a thyroid ultrasound, followed by a 4D CT before referring her back to Dr. Harlow Asa.  She saw him in the past and he was reticent to do exploratory surgery without a localizing lesion on the imaging scan and we agreed to just follow her clinically and biochemically in the case that the new imaging investigation is negative. - Criteria for parathyroid surgery are:  . Increased calcium by more than 1 mg/dL above the upper limit of normal  . Kidney ds.  . Osteoporosis (or vertebral fracture) . Age <75 years old Newer criteria (2013): [name] High UCa >400 mg/d and increased stone risk by biochemical stone risk analysis . Presence of nephrolithiasis or nephrocalcinosis . Pt's preference!  - We will also check a new DXA scan to see if she has osteoporosis (+ add a 33% distal radius for evaluation of cortical bone, which is predominantly affected by hyperparathyroidism)-ordered today - I will see the patient back in 4 months  Component     Latest Ref Rng & Units 08/25/2018 08/26/2018  Glucose     65 - 99 mg/dL 107 (H)   BUN     7 - 25 mg/dL 19   Creatinine     0.50 - 0.99 mg/dL 0.83   GFR, Est Non African American     > OR = 60 mL/min/1.50m2 73   GFR, Est African American     > OR = 60 mL/min/1.37m2 85   BUN/Creatinine Ratio     6 - 22 (calc) NOT APPLICABLE   Sodium     135 - 146 mmol/L 137   Potassium     3.5 - 5.3 mmol/L 4.9   Chloride     98 - 110 mmol/L 103   CO2     20 - 32 mmol/L 24   Calcium     8.6 - 10.4 mg/dL 11.0 (H)   Vitamin D 1, 25 (OH) Total     18 - 72 pg/mL 87 (H)   Vitamin D3 1, 25 (OH)     pg/mL 87   Vitamin D2 1, 25 (OH)     pg/mL <8   PTH, Intact     15 - 65 pg/mL  37  Magnesium     1.5 - 2.5 mg/dL  2.1  Calcium, 24H Urine      mg/24 h 300 (H)   Creatinine, 24H Ur     0.50 - 2.15 g/24 h 1.21   Phosphorus     2.3 - 4.6 mg/dL  2.9  VITD     30.00 - 100.00 ng/mL  62.69  Extra Urine Specimen          Elevated calcium, nonsuppressed PTH, elevated calcitriol and 24-hour urinary calcium.  Labs point towards primary hyperparathyroidism.  Will check a technetium sestamibi scan.  New DXA scan:  Date L1-L4 (L2) T score FN T score 33% distal Radius 33% distal radius  09/20/2018 ([facility])  -1.4 LFN: -1.2 RFN: -1.0 -2.7 -2.5   Osteoporosis. Scores worse at the  Radial site, as expected with PHPTH.  Will await the Parathyroid scan results - scheduled 10/12/2017.  Addendum - 10/11/2018: [employer] refused to cover the parathyroid scan and the reason for this is still due to be the fact that her PTH is not elevated...  We did start a PA for the test but I would also like to put a  referral in for her to see Dr. Harlow Asa at this point.  Addendum - 10/12/2018: Parathyroid scan approved after peer to peer review.   Reading Physician Reading Date Result Priority  Lavonia Dana, MD 10/21/2018     Narrative & Impression    CLINICAL DATA:  Hyperparathyroidism, abnormal blood work  EXAM: NM PARATHYROID SCINTIGRAPHY AND SPECT IMAGING  TECHNIQUE: Following intravenous administration of radiopharmaceutical, early and 2-hour delayed planar images were obtained in the anterior projection. Delayed triplanar SPECT images were also obtained at 2 hours.  RADIOPHARMACEUTICALS:  21.7 mCi Tc-45m Sestamibi IV  COMPARISON:  None  FINDINGS: Planar imaging: Normal initial distribution of sestamibi within the thyroid lobes. Normal washout of tracer from the thyroid lobes. No definite focal areas of abnormal sestamibi retention are seen to suggest parathyroid adenoma.  SPECT imaging: Normal residual tracer distribution in the thyroid lobes. No definite areas of abnormal sestamibi retention are identified to  suggest parathyroid adenoma. No ectopic localization of tracer within the mediastinum.  IMPRESSION: Negative parathyroid scan.   Electronically Signed   By: Lavonia Dana M.D.   On: 10/21/2018 14:33    Will probably benefit from a 4D CT after she sees Dr. Harlow Asa.  Philemon Kingdom, MD PhD [facility]

## 2018-08-24 ENCOUNTER — Encounter: Payer: Self-pay | Admitting: Internal Medicine

## 2018-08-26 ENCOUNTER — Other Ambulatory Visit (INDEPENDENT_AMBULATORY_CARE_PROVIDER_SITE_OTHER): Payer: Medicare Other

## 2018-08-26 LAB — PHOSPHORUS: Phosphorus: 2.9 mg/dL (ref 2.3–4.6)

## 2018-08-26 LAB — MAGNESIUM: Magnesium: 2.1 mg/dL (ref 1.5–2.5)

## 2018-08-26 LAB — VITAMIN D 25 HYDROXY (VIT D DEFICIENCY, FRACTURES): VITD: 62.69 ng/mL (ref 30.00–100.00)

## 2018-08-27 LAB — PARATHYROID HORMONE, INTACT (NO CA): PTH: 37 pg/mL (ref 15–65)

## 2018-08-29 LAB — CREATININE, URINE, 24 HOUR: Creatinine, 24H Ur: 1.21 g/(24.h) (ref 0.50–2.15)

## 2018-08-29 LAB — BASIC METABOLIC PANEL WITH GFR
BUN: 19 mg/dL (ref 7–25)
CALCIUM: 11 mg/dL — AB (ref 8.6–10.4)
CO2: 24 mmol/L (ref 20–32)
CREATININE: 0.83 mg/dL (ref 0.50–0.99)
Chloride: 103 mmol/L (ref 98–110)
GFR, EST AFRICAN AMERICAN: 85 mL/min/{1.73_m2} (ref >=60)
GFR, EST NON AFRICAN AMERICAN: 73 mL/min/{1.73_m2} (ref >=60)
Glucose, Bld: 107 mg/dL — ABNORMAL HIGH (ref 65–99)
Potassium: 4.9 mmol/L (ref 3.5–5.3)
Sodium: 137 mmol/L (ref 135–146)

## 2018-08-29 LAB — VITAMIN D 1,25 DIHYDROXY
VITAMIN D3 1, 25 (OH): 87 pg/mL
Vitamin D 1, 25 (OH)2 Total: 87 pg/mL — ABNORMAL HIGH (ref 18–72)

## 2018-08-29 LAB — EXTRA URINE SPECIMEN

## 2018-08-29 LAB — CALCIUM, URINE, 24 HOUR: CALCIUM 24H UR: 300 mg/(24.h) — AB

## 2018-08-30 ENCOUNTER — Encounter: Payer: Self-pay | Admitting: Internal Medicine

## 2018-08-31 ENCOUNTER — Other Ambulatory Visit: Payer: Self-pay

## 2018-09-07 MED ORDER — PREDNISONE 20 MG PO TABS: ORAL_TABLET | ORAL | 0 refills | Status: DC

## 2018-09-08 ENCOUNTER — Other Ambulatory Visit: Payer: Self-pay | Admitting: Internal Medicine

## 2018-09-08 DIAGNOSIS — E213 Hyperparathyroidism, unspecified: Secondary | ICD-10-CM

## 2018-09-20 ENCOUNTER — Ambulatory Visit
Admission: RE | Admit: 2018-09-20 | Discharge: 2018-09-20 | Disposition: A | Discharge status: Home / Self Care | Payer: Medicare Other | Source: Ambulatory Visit | Attending: Internal Medicine | Admitting: Internal Medicine

## 2018-09-20 ENCOUNTER — Other Ambulatory Visit (HOSPITAL_COMMUNITY): Payer: Medicare Other

## 2018-09-20 ENCOUNTER — Ambulatory Visit (HOSPITAL_COMMUNITY): Payer: Medicare Other

## 2018-09-27 ENCOUNTER — Other Ambulatory Visit: Payer: Self-pay | Admitting: Internal Medicine

## 2018-09-27 MED ORDER — DEXAMETHASONE 0.5 MG PO TABS: ORAL_TABLET | ORAL | 0 refills | Status: DC

## 2018-09-27 MED ORDER — AZITHROMYCIN 250 MG PO TABS: ORAL_TABLET | ORAL | 1 refills | Status: DC

## 2018-09-27 NOTE — Telephone Encounter (Signed)
Patient is calling to check status on note she sent yesterday asking if we would call in rx? For being sick?

## 2018-09-29 ENCOUNTER — Other Ambulatory Visit: Payer: Self-pay | Admitting: Physician Assistant

## 2018-10-07 ENCOUNTER — Telehealth: Payer: Self-pay

## 2018-10-07 ENCOUNTER — Encounter: Payer: Self-pay | Admitting: Internal Medicine

## 2018-10-07 NOTE — Telephone Encounter (Signed)
PA has been initiated for this and Kathryn Booker has been informed of what needs to be faxed to [facility]- this case is in clinical review for 48 hours and I will check the status on Monday

## 2018-10-07 NOTE — Telephone Encounter (Signed)
Last chart notes and labs have been faxed.

## 2018-10-07 NOTE — Telephone Encounter (Signed)
Received call from [facility] with pre-registration [phone] (direct line). States she is unable to schedule at this time without a PA for NM Parathyroid w/spect CT. Once completed, asking for a returned call at # provided above. Message routed to Dr. Arman Filter CMA.

## 2018-10-07 NOTE — Telephone Encounter (Signed)
Okay, we can try to do a PA.  If not improved, I would like to refer her to surgery, I am sure that Dr. Harlow Asa will have better success at obtaining this for her, if needed.

## 2018-10-11 ENCOUNTER — Encounter: Payer: Self-pay | Admitting: Internal Medicine

## 2018-10-11 NOTE — Addendum Note (Signed)
Addended by: Philemon Kingdom on: 10/11/2018 12:19 PM   Modules accepted: Orders

## 2018-10-12 ENCOUNTER — Encounter (HOSPITAL_COMMUNITY): Payer: Self-pay

## 2018-10-12 ENCOUNTER — Encounter (HOSPITAL_COMMUNITY): Payer: Medicare Other

## 2018-10-21 ENCOUNTER — Ambulatory Visit (HOSPITAL_COMMUNITY)
Admission: RE | Admit: 2018-10-21 | Discharge: 2018-10-21 | Disposition: A | Discharge status: Home / Self Care | Payer: Medicare Other | Source: Ambulatory Visit | Attending: Internal Medicine | Admitting: Internal Medicine

## 2018-10-21 ENCOUNTER — Encounter: Payer: Self-pay | Admitting: Internal Medicine

## 2018-10-21 ENCOUNTER — Encounter (HOSPITAL_COMMUNITY)
Admission: RE | Admit: 2018-10-21 | Discharge: 2018-10-21 | Disposition: A | Discharge status: Home / Self Care | Payer: Medicare Other | Source: Ambulatory Visit | Attending: Internal Medicine | Admitting: Internal Medicine

## 2018-10-21 DIAGNOSIS — E213 Hyperparathyroidism, unspecified: Secondary | ICD-10-CM | POA: Diagnosis present

## 2018-10-21 MED ORDER — TECHNETIUM TC 99M SESTAMIBI GENERIC - CARDIOLITE
21.7000 | Freq: Once | INTRAVENOUS | Status: AC | PRN
  Administered 2018-10-21: 21.7 via INTRAVENOUS

## 2018-10-22 ENCOUNTER — Other Ambulatory Visit: Payer: Self-pay | Admitting: Internal Medicine

## 2018-10-22 DIAGNOSIS — E213 Hyperparathyroidism, unspecified: Secondary | ICD-10-CM

## 2018-10-26 ENCOUNTER — Telehealth: Payer: Self-pay

## 2018-10-26 ENCOUNTER — Ambulatory Visit (HOSPITAL_COMMUNITY)
Admission: RE | Admit: 2018-10-26 | Discharge: 2018-10-26 | Disposition: A | Discharge status: Home / Self Care | Payer: Medicare Other | Source: Ambulatory Visit | Attending: Adult Health | Admitting: Adult Health

## 2018-10-26 ENCOUNTER — Other Ambulatory Visit: Payer: Self-pay | Admitting: Physician Assistant

## 2018-10-26 ENCOUNTER — Ambulatory Visit: Payer: Medicare Other | Admitting: Adult Health

## 2018-10-26 ENCOUNTER — Encounter: Payer: Self-pay | Admitting: Adult Health

## 2018-10-26 VITALS — BP 90/60 | HR 77 | Temp 97.3°F | Ht 63.0 in | Wt 148.0 lb

## 2018-10-26 DIAGNOSIS — R0989 Other specified symptoms and signs involving the circulatory and respiratory systems: Secondary | ICD-10-CM

## 2018-10-26 DIAGNOSIS — J301 Allergic rhinitis due to pollen: Secondary | ICD-10-CM

## 2018-10-26 DIAGNOSIS — J4531 Mild persistent asthma with (acute) exacerbation: Secondary | ICD-10-CM

## 2018-10-26 MED ORDER — IPRATROPIUM-ALBUTEROL 0.5-2.5 (3) MG/3ML IN SOLN: 3.0000 mL | Freq: Once | RESPIRATORY_TRACT | Status: DC

## 2018-10-26 MED ORDER — FLUTICASONE PROPIONATE 50 MCG/ACT NA SUSP: 1.0000 | Freq: Every day | NASAL | 2 refills | Status: DC

## 2018-10-26 MED ORDER — PROMETHAZINE-DM 6.25-15 MG/5ML PO SYRP: 5.0000 mL | ORAL_SOLUTION | Freq: Four times a day (QID) | ORAL | 1 refills | Status: DC | PRN

## 2018-10-26 MED ORDER — PREDNISONE 20 MG PO TABS: ORAL_TABLET | ORAL | 0 refills | Status: AC

## 2018-10-26 MED ORDER — DOXYCYCLINE HYCLATE 100 MG PO CAPS: ORAL_CAPSULE | ORAL | 0 refills | Status: DC

## 2018-10-26 NOTE — Progress Notes (Signed)
Assessment and Plan:  Kathryn Booker was seen today for acute visit.  Diagnoses and all orders for this visit:  Mild persistent asthmatic bronchitis with acute exacerbation CXR r/o pneumonia; suggestive of acute bronchitis but with COPD changes Suggested trial of alternate inhaler but she would like to wait a week or two and see how she does with meds prescribed today Suggested symptomatic OTC remedies. Nasal saline spray for congestion. Nasal steroids, rotate allergy pill, oral steroids offered -     ipratropium-albuterol (DUONEB) 0.5-2.5 (3) MG/3ML nebulizer solution 3 mL -     predniSONE (DELTASONE) 20 MG tablet; 3 tablets daily with food for 3 days, 2 tabs daily for 3 days, 1 tab a day for 5 days. -     doxycycline (VIBRAMYCIN) 100 MG capsule; Take 1 capsule twice daily with food -     promethazine-dextromethorphan (PROMETHAZINE-DM) 6.25-15 MG/5ML syrup; Take 5 mLs by mouth 4 (four) times daily as needed for cough.  Abnormal chest sounds -     DG Chest 2 View; Future -     ipratropium-albuterol (DUONEB) 0.5-2.5 (3) MG/3ML nebulizer solution 3 mL  Non-seasonal allergic rhinitis due to pollen -     fluticasone (FLONASE) 50 MCG/ACT nasal spray; Place 1 spray into both nostrils daily.  Further disposition pending results of labs. Discussed med's effects and SE's.   Over 30 minutes of exam, counseling, chart review, and critical decision making was performed.   Future Appointments  Date Time Provider [place]  12/22/2018 11:00 AM Thompson Grayer, MD [facility]  12/23/2018 10:30 AM Philemon Kingdom, MD [facility] None  05/31/2019  9:00 AM Vicie Mutters, PA-C [facility] None    ------------------------------------------------------------------------------------------------------------------   HPI BP 90/60   Pulse 77   Temp (!) 97.3 F (36.3 C)   Ht 5\' 3"  (1.6 m)   Wt 148 lb (67.1 kg)   SpO2 98%   BMI 26.22 kg/m   67 y.o.female with hx of allergies and asthma  with frequent hx of bronchitis presents for evaluation of persistent URI symptoms since early December 2019. She did a course of prednisone and improved, but started having symptoms again and completed a course of zpak and dexamathasone which improved symptoms until she began to have symptoms again 4-5 days ago. She reports feeling very unwell, mildly achy over the weekend, feeling weak, increasingly productive cough.   She has been on singulair, advair inhaler, added mucinex, on daily, takes zyrtec daily,  She has hx of severe persistent allergies, did allergy shots for nearly 20 years. Has deviated septum, R sinus tends to be congested, saw ENT but was told not severe enough for surgery. She was using flonase but stopped  Formerly diagnosed as COPD but saw pulmonology, PFTs apparently more suggestive of asthma; CXR shows hyperinflation-   Past Medical History:  Diagnosis Date  . Anxiety   . Asthma   . COPD (chronic obstructive pulmonary disease) ([place])   . Coronary artery calcification seen on CT scan   . Depression   . Fatigue   . Former tobacco use   . Hyperlipidemia   . Paroxysmal atrial fibrillation ([facility])   . Pre-diabetes   . RBBB (right bundle branch block with left anterior fascicular block)   . Scoliosis   . Serum calcium elevated   . Thyroid disease      Allergies  Allergen Reactions  . Lipitor [Atorvastatin]     Myalgias   . Tetanus Toxoids Itching    Current Outpatient Medications on File Prior  to Visit  Medication Sig  . albuterol (PROAIR HFA) 108 (90 Base) MCG/ACT inhaler INHALE 2 PUFFS INTO THE LUNGS EVERY 6 HOURS AS NEEDED FOR WHEEZING OR SHORTNESS OF BREATH.  [name] azelastine (ASTELIN) 0.1 % nasal spray Place 2 sprays into both nostrils 2 (two) times daily. Use in each nostril as directed  . buPROPion (WELLBUTRIN XL) 300 MG 24 hr tablet Take 1 tablet (300 mg total) by mouth daily.  . Cholecalciferol (VITAMIN D PO) Take 1,000 Units by mouth daily.   . citalopram  (CELEXA) 40 MG tablet Take 1 tablet (40 mg total) by mouth daily.  [name] diltiazem (CARDIZEM CD) 120 MG 24 hr capsule TAKE 1 CAPSULE BY MOUTH DAILY. PLEASE KEEP UPCOMING APPT FOR FUTURE REFILLS. THANK YOU  . diltiazem (CARDIZEM) 30 MG tablet Take 1 tablet (30 mg total) by mouth as needed (fast palpitations).  . ezetimibe (ZETIA) 10 MG tablet Take 1 tablet (10 mg total) by mouth daily.  [name] guaiFENesin (MUCINEX) 600 MG 12 hr tablet Take 800 mg by mouth 3 (three) times daily.   . magnesium oxide (MAG-OX) 400 MG tablet Take 400 mg by mouth daily.  . montelukast (SINGULAIR) 10 MG tablet Take 1 tablet (10 mg total) by mouth at bedtime.  [name] nystatin cream (MYCOSTATIN) Apply 1 application topically 2 (two) times daily.  [name] OVER THE COUNTER MEDICATION Eye drop 1 drop each eye daily.  . rivaroxaban (XARELTO) 20 MG TABS tablet TAKE 1 TABLET BY MOUTH DAILY WITH SUPPER  . valACYclovir (VALTREX) 500 MG tablet Take 1 tablet (500 mg total) by mouth daily.  [name] azithromycin (ZITHROMAX) 250 MG tablet Take 2 tablets (500 mg) on  Day 1,  followed by 1 tablet (250 mg) once daily on Days 2 through 5.  . dexamethasone (DECADRON) 0.5 MG tablet Take 1 tab 3 x day - 3 days, then 2 x day - 3 days, then 1 tab daily  . Multiple Vitamins-Minerals (MULTIVITAMIN WITH MINERALS) tablet Take 1 tablet by mouth daily.  [name] triamcinolone cream (KENALOG) 0.5 % Apply 1 application topically 2 (two) times daily. (Patient not taking: Reported on 10/26/2018)   No current facility-administered medications on file prior to visit.     ROS: all negative except above.   Physical Exam:  BP 90/60   Pulse 77   Temp (!) 97.3 F (36.3 C)   Ht 5\' 3"  (1.6 m)   Wt 148 lb (67.1 kg)   SpO2 98%   BMI 26.22 kg/m   General Appearance: Well nourished, in no apparent distress. Eyes: PERRLA, EOMs, conjunctiva no swelling or erythema Sinuses: No Frontal/maxillary tenderness ENT/Mouth: Ext aud canals clear, TMs without erythema, bulging. No erythema,  swelling, or exudate on post pharynx.  Tonsils not swollen or erythematous. Nasal septum deviated, nasal turbinates boggy and pale. Hearing normal.  Neck: Supple, thyroid normal.  Respiratory: Respiratory effort normal,  BS equal bilaterally without rales, she does have small scattered rhonchi, coarse wheezing throughout, coarse bronchial sounds without stridor. Frequent hacky cough- Cardio: RRR with no MRGs. Brisk peripheral pulses without edema.  Abdomen: Soft, + BS.  Non tender, no guarding, rebound, hernias, masses. Lymphatics: Non tender without lymphadenopathy.  Musculoskeletal: Symmetrical strength, normal gait.  Skin: Warm, dry without rashes, lesions, ecchymosis.  Neuro: Cranial nerves intact. Normal muscle tone, no cerebellar symptoms. Psych: Awake and oriented X 3, normal affect, Insight and Judgment appropriate.    Izora Ribas, NP 11:48 AM [facility]

## 2018-10-26 NOTE — Telephone Encounter (Signed)
Promethazine is on back order. Please send to Kristopher Oppenheim, [facility]

## 2018-10-26 NOTE — Patient Instructions (Signed)
If still having persistent coughing/respiratory symptoms, recommend follow up with pulmonology  Go to ER for any shortness of breath or suddenly worsening symptoms     Acute Bronchitis, Adult  Acute bronchitis is sudden (acute) swelling of the air tubes (bronchi) in the lungs. Acute bronchitis causes these tubes to fill with mucus, which can make it hard to breathe. It can also cause coughing or wheezing. In adults, acute bronchitis usually goes away within 2 weeks. A cough caused by bronchitis may last up to 3 weeks. Smoking, allergies, and asthma can make the condition worse. Repeated episodes of bronchitis may cause further lung problems, such as chronic obstructive pulmonary disease (COPD). What are the causes? This condition can be caused by germs and by substances that irritate the lungs, including:  Cold and flu viruses. This condition is most often caused by the same virus that causes a cold.  Bacteria.  Exposure to tobacco smoke, dust, fumes, and air pollution. What increases the risk? This condition is more likely to develop in people who:  Have close contact with someone with acute bronchitis.  Are exposed to lung irritants, such as tobacco smoke, dust, fumes, and vapors.  Have a weak immune system.  Have a respiratory condition such as asthma. What are the signs or symptoms? Symptoms of this condition include:  A cough.  Coughing up clear, yellow, or green mucus.  Wheezing.  Chest congestion.  Shortness of breath.  A fever.  Body aches.  Chills.  A sore throat. How is this diagnosed? This condition is usually diagnosed with a physical exam. During the exam, your health care provider may order tests, such as chest X-rays, to rule out other conditions. He or she may also:  Test a sample of your mucus for bacterial infection.  Check the level of oxygen in your blood. This is done to check for pneumonia.  Do a chest X-ray or lung function testing to  rule out pneumonia and other conditions.  Perform blood tests. Your health care provider will also ask about your symptoms and medical history. How is this treated? Most cases of acute bronchitis clear up over time without treatment. Your health care provider may recommend:  Drinking more fluids. Drinking more makes your mucus thinner, which may make it easier to breathe.  Taking a medicine for a fever or cough.  Taking an antibiotic medicine.  Using an inhaler to help improve shortness of breath and to control a cough.  Using a cool mist vaporizer or humidifier to make it easier to breathe. Follow these instructions at home: Medicines  Take over-the-counter and prescription medicines only as told by your health care provider.  If you were prescribed an antibiotic, take it as told by your health care provider. Do not stop taking the antibiotic even if you start to feel better. General instructions   Get plenty of rest.  Drink enough fluids to keep your urine pale yellow.  Avoid smoking and secondhand smoke. Exposure to cigarette smoke or irritating chemicals will make bronchitis worse. If you smoke and you need help quitting, ask your health care provider. Quitting smoking will help your lungs heal faster.  Use an inhaler, cool mist vaporizer, or humidifier as told by your health care provider.  Keep all follow-up visits as told by your health care provider. This is important. How is this prevented? To lower your risk of getting this condition again:  Wash your hands often with soap and water. If soap and water are  not available, use hand sanitizer.  Avoid contact with people who have cold symptoms.  Try not to touch your hands to your mouth, nose, or eyes.  Make sure to get the flu shot every year. Contact a health care provider if:  Your symptoms do not improve in 2 weeks of treatment. Get help right away if:  You cough up blood.  You have chest pain.  You have  severe shortness of breath.  You become dehydrated.  You faint or keep feeling like you are going to faint.  You keep vomiting.  You have a severe headache.  Your fever or chills gets worse. This information is not intended to replace advice given to you by your health care provider. Make sure you discuss any questions you have with your health care provider. Document Released: 10/30/2004 Document Revised: 05/06/2017 Document Reviewed: 03/12/2016 Elsevier Interactive Patient Education  2019 [employer].

## 2018-11-04 ENCOUNTER — Other Ambulatory Visit: Payer: Medicare Other

## 2018-11-19 LAB — HM DIABETES EYE EXAM

## 2018-11-22 ENCOUNTER — Encounter: Payer: Self-pay | Admitting: *Deleted

## 2018-11-24 ENCOUNTER — Ambulatory Visit
Admission: RE | Admit: 2018-11-24 | Discharge: 2018-11-24 | Disposition: A | Discharge status: Home / Self Care | Payer: Medicare Other | Source: Ambulatory Visit | Attending: Internal Medicine | Admitting: Internal Medicine

## 2018-11-24 DIAGNOSIS — E213 Hyperparathyroidism, unspecified: Secondary | ICD-10-CM

## 2018-11-25 ENCOUNTER — Encounter: Payer: Self-pay | Admitting: Internal Medicine

## 2018-11-25 ENCOUNTER — Other Ambulatory Visit: Payer: Self-pay | Admitting: Adult Health

## 2018-11-25 DIAGNOSIS — J301 Allergic rhinitis due to pollen: Secondary | ICD-10-CM

## 2018-12-02 ENCOUNTER — Other Ambulatory Visit: Payer: Self-pay | Admitting: Physician Assistant

## 2018-12-02 DIAGNOSIS — N6489 Other specified disorders of breast: Secondary | ICD-10-CM

## 2018-12-09 ENCOUNTER — Ambulatory Visit
Admission: RE | Admit: 2018-12-09 | Discharge: 2018-12-09 | Disposition: A | Discharge status: Home / Self Care | Payer: Medicare Other | Source: Ambulatory Visit | Attending: Physician Assistant | Admitting: Physician Assistant

## 2018-12-09 DIAGNOSIS — N6489 Other specified disorders of breast: Secondary | ICD-10-CM

## 2018-12-18 ENCOUNTER — Other Ambulatory Visit: Payer: Self-pay | Admitting: Physician Assistant

## 2018-12-22 ENCOUNTER — Ambulatory Visit: Payer: Medicare Other | Admitting: Internal Medicine

## 2018-12-23 ENCOUNTER — Other Ambulatory Visit: Payer: Self-pay | Admitting: Physician Assistant

## 2018-12-23 ENCOUNTER — Ambulatory Visit: Payer: Medicare Other | Admitting: Internal Medicine

## 2019-01-11 ENCOUNTER — Other Ambulatory Visit: Payer: Self-pay | Admitting: Internal Medicine

## 2019-01-13 ENCOUNTER — Other Ambulatory Visit: Payer: Self-pay | Admitting: Physician Assistant

## 2019-01-19 ENCOUNTER — Other Ambulatory Visit: Payer: Self-pay | Admitting: Physician Assistant

## 2019-01-25 ENCOUNTER — Other Ambulatory Visit: Payer: Self-pay | Admitting: Adult Health

## 2019-01-27 ENCOUNTER — Telehealth: Payer: Self-pay

## 2019-01-27 NOTE — Telephone Encounter (Signed)
Called pt regarding appt on 01/31/19. Unable to leave message due to voicemail  not set up.

## 2019-01-27 NOTE — Telephone Encounter (Signed)
Spoke with pt regarding appt on 01/31/19. Pt was advise to check vitals to appt. Pt questions and concerns were address.

## 2019-01-27 NOTE — Telephone Encounter (Signed)
Follow Up:; ° ° °Returning your call. °

## 2019-01-31 ENCOUNTER — Telehealth (INDEPENDENT_AMBULATORY_CARE_PROVIDER_SITE_OTHER): Payer: Medicare Other | Admitting: Internal Medicine

## 2019-01-31 ENCOUNTER — Other Ambulatory Visit: Payer: Self-pay

## 2019-01-31 VITALS — BP 128/54 | HR 62

## 2019-01-31 DIAGNOSIS — I480 Paroxysmal atrial fibrillation: Secondary | ICD-10-CM

## 2019-01-31 DIAGNOSIS — R002 Palpitations: Secondary | ICD-10-CM | POA: Diagnosis not present

## 2019-01-31 NOTE — Progress Notes (Signed)
Electrophysiology TeleHealth Note  Due to national recommendations of social distancing due to [place] 19, an audio  telehealth visit is felt to be most appropriate for this patient at this time.  Verbal consent was obtained from the patient today.  Unfortunately, she could not get her smartphone to connect with mychart video, google hangout, or with doximity.  A virtual visit could therefore not be performed today.  Date:  01/31/2019   ID:  Kathryn Booker, Kathryn Booker 1951-03-14, MRN [id number]  Location: patient's home  Provider location: [address], [place]  Evaluation Performed: Follow-up visit  PCP:  Vicie Mutters, PA-C  Cardiologist:  Dr Johnsie Cancel Electrophysiologist:  Dr Rayann Heman  Chief Complaint:  afib  History of Present Illness:    Kathryn Booker is a 68 y.o. female who presents via audio conferencing for a telehealth visit today.  Since last being seen in our clinic, the patient reports doing very well. She has rare palpitations but is not convinced that it is afib.  These occur every frew months and are concerning to her.  Today, she denies symptoms of chest pain, shortness of breath,  lower extremity edema, dizziness, presyncope, or syncope.  The patient is otherwise without complaint today.  The patient denies symptoms of fevers, chills, cough, or new SOB worrisome for COVID 19.  Past Medical History:  Diagnosis Date  . Anxiety   . Asthma   . COPD (chronic obstructive pulmonary disease) ([place])   . Coronary artery calcification seen on CT scan   . Depression   . Fatigue   . Former tobacco use   . Hyperlipidemia   . Paroxysmal atrial fibrillation ([facility])   . Pre-diabetes   . RBBB (right bundle branch block with left anterior fascicular block)   . Scoliosis   . Serum calcium elevated   . Thyroid disease     Past Surgical History:  Procedure Laterality Date  . APPENDECTOMY    . BREAST EXCISIONAL BIOPSY Right 2017   benign  . LAPROSCOPIC     X 2 IN  EARLY 30-40'S  . OVARIAN CYST REMOVAL    . RADIOACTIVE SEED GUIDED EXCISIONAL BREAST BIOPSY Right 05/15/2016   Procedure: RIGHT RADIOACTIVE SEED GUIDED EXCISIONAL BREAST BIOPSY;  Surgeon: Rolm Bookbinder, MD;  Location: [facility];  Service: General;  Laterality: Right;  RIGHT RADIOACTIVE SEED GUIDED EXCISIONAL BREAST BIOPSY  . TONSILECTOMY, ADENOIDECTOMY, BILATERAL MYRINGOTOMY AND TUBES      Current Outpatient Medications  Medication Sig Dispense Refill  . albuterol (PROVENTIL HFA;VENTOLIN HFA) 108 (90 Base) MCG/ACT inhaler Use 2 inhalations 15 minutes apart every 4 hours to rescue Asthma 3 Inhaler 3  . azelastine (ASTELIN) 0.1 % nasal spray SPRAY 2 SPRAYS INTO EACH NOSTRIL TWICE A DAY AS DIRECTED 90 mL 3  . buPROPion (WELLBUTRIN XL) 300 MG 24 hr tablet Take 1 tablet (300 mg total) by mouth daily. 90 tablet 1  . Cholecalciferol (D-3-5) 125 MCG (5000 UT) capsule Take 5,000 Units by mouth daily.    . citalopram (CELEXA) 40 MG tablet Take 20 mg by mouth daily.    [name] diltiazem (CARDIZEM CD) 120 MG 24 hr capsule TAKE 1 CAPSULE BY MOUTH DAILY. PLEASE KEEP UPCOMING APPT FOR FUTURE REFILLS. THANK YOU 90 capsule 0  . diltiazem (CARDIZEM) 30 MG tablet TAKE 1 TABLET BY MOUTH AS NEEDED (FAST PALPITATIONS) 90 tablet 1  . ezetimibe (ZETIA) 10 MG tablet Take 1 tablet (10 mg total) by mouth daily. 90 tablet  1  . fluticasone (FLONASE) 50 MCG/ACT nasal spray PLACE 1 SPRAY INTO EACH NOSTRIL ONCE DAILY 48 g 3  . Fluticasone-Salmeterol (ADVAIR DISKUS) 250-50 MCG/DOSE AEPB INHALE 1 PUFF INTO THE LUNGS TWICE A DAY**RINSE MOUTH AFTER INHALATION 180 each 0  . guaiFENesin (MUCINEX) 600 MG 12 hr tablet Take 800 mg by mouth 3 (three) times daily.     . magnesium oxide (MAG-OX) 400 MG tablet Take 400 mg by mouth daily.    . montelukast (SINGULAIR) 10 MG tablet TAKE 1 TABLET BY MOUTH EVERYDAY AT BEDTIME 90 tablet 1  . OVER THE COUNTER MEDICATION Eye drop 1 drop each eye daily.    .  promethazine-dextromethorphan (PROMETHAZINE-DM) 6.25-15 MG/5ML syrup Take 5 mLs by mouth 4 (four) times daily as needed for cough. 240 mL 1  . rivaroxaban (XARELTO) 20 MG TABS tablet TAKE 1 TABLET BY MOUTH DAILY WITH SUPPER 90 tablet 1  . valACYclovir (VALTREX) 500 MG tablet Take 1 tablet (500 mg total) by mouth daily. 90 tablet 1   Current Facility-Administered Medications  Medication Dose Route Frequency Provider Last Rate Last Dose  . ipratropium-albuterol (DUONEB) 0.5-2.5 (3) MG/3ML nebulizer solution 3 mL  3 mL Nebulization Once Liane Comber, NP        Allergies:   Lipitor [atorvastatin] and Tetanus toxoids   Social History:  The patient  reports that she quit smoking about 5 years ago. Her smoking use included cigarettes. She has a 70.00 pack-year smoking history. She has never used smokeless tobacco. She reports current alcohol use. She reports that she does not use drugs.   Family History:  The patient's  family history includes Alcohol abuse in her father; Heart disease in her mother; Heart failure in her mother; Hyperlipidemia in her mother; Hypertension in her mother; Liver disease in her father.   ROS:  Please see the history of present illness.   All other systems are personally reviewed and negative.   Exam:    Vital Signs:  BP (!) 128/54   Pulse 62   Well sounding  Labs/Other Tests and Data Reviewed:    Recent Labs: 05/27/2018: ALT 35; Hemoglobin 14.6; Platelets 249; TSH 2.09 08/25/2018: BUN 19; Creat 0.83; Potassium 4.9; Sodium 137 08/26/2018: Magnesium 2.1   Wt Readings from Last 3 Encounters:  10/26/18 148 lb (67.1 kg)  08/19/18 148 lb (67.1 kg)  05/27/18 148 lb 6.4 oz (67.3 kg)     Other studies personally reviewed: Additional studies/ records that were reviewed today include: my prior notes, EP NPs not from 04/21/18  Review of the above records today demonstrates: as above     ASSESSMENT & PLAN:    1.  Paroxysmal atrial fibrillation Doing very well  She has rare palpitations for which are of unclear etiology.  These are infrequent and unlikely to be captured on 30 day monitor.  We discussed loop recorder for afib management and for further characterization of her palpitations. On xarelto for chads2vasc score of 2  2. COVID 19 screen The patient denies symptoms of COVID 19 at this time.  The importance of social distancing was discussed today.  Follow-up:  We will contact after COVID 19 for ILR implant  Current medicines are reviewed at length with the patient today.   The patient does not have concerns regarding her medicines.  The following changes were made today:  none  Labs/ tests ordered today include:  No orders of the defined types were placed in this encounter.  Patient Risk:  after  full review of this patients clinical status, I feel that they are at moderate risk at this time.  Today, I have spent 15 minutes with the patient with telehealth technology discussing afib and palpitations .    Army Fossa, MD  01/31/2019 12:21 PM     [address] [place] [address] [phone] (office) [phone] (fax)

## 2019-01-31 NOTE — Telephone Encounter (Signed)
New Message:    Pt is having problem getting on for her appt.

## 2019-02-01 NOTE — Progress Notes (Signed)
Wellness visit THIS ENCOUNTER IS A VIRTUAL/TELEPHONE VISIT DUE TO COVID-19 - PATIENT WAS NOT SEEN IN THE OFFICE.  PATIENT HAS CONSENTED TO VIRTUAL VISIT / TELEMEDICINE VISIT  This provider placed a call to [place] using telephone, her appointment was changed to a virtual office visit to reduce the risk of exposure to the COVID-19 virus and to help Kathryn Booker remain healthy and safe. The virtual visit will also provide continuity of care. She verbalizes understanding.   Assessment and Plan:  Encounter for Medicare annual wellness exam 1 year  Paroxysmal A-fib ([name]) Continue follow up cardio  Atherosclerosis of aorta (HCC) Control blood pressure, cholesterol, glucose, increase exercise.   Hypothyroidism, unspecified type Hypothyroidism-check TSH level, continue medications the same, reminded to take on an empty stomach 30-52mins before food.   Hyperlipidemia check lipids decrease fatty foods increase activity.   Recurrent major depressive disorder, in partial remission ([name]) - continue medications, stress management techniques discussed, increase water, good sleep hygiene discussed, increase exercise, and increase veggies.  Continue meds for now, can discuss decreasing/stopping next sprink  Abnormal glucose Discussed disease progression and risks Discussed diet/exercise, weight management and risk modification  Hypercalcemia Continue follow up endocrin  Vitamin D deficiency Continue supplement  RBBB (right bundle branch block with left anterior fascicular block) Monitor  Coronary artery calcification seen on CT scan Monitor  Uncomplicated asthma, unspecified asthma severity, unspecified whether persistent Continue meds  Non-seasonal allergic rhinitis due to pollen Continue meds  Osteoporosis, unspecified location Monitor Declines meds at this time  Screen for colon cancer -     Cologuard  Discussed med's effects and SE's. Screening labs and  tests as requested with regular follow-up as recommended. Over 30 minutes of exam, counseling, chart review, and complex, high level critical decision making was performed this visit.  Future Appointments  Date Time Provider [place]  06/01/2019  9:00 AM Vicie Mutters, PA-C [facility] None  02/05/2021  2:00 PM Vicie Mutters, PA-C [facility] None    HPI  68 y.o. female  presents for a complete physical.  Her blood pressure has been controlled at home, today their BP is BP: 116/71 She does not workout, trying to walk. She denies chest pain, shortness of breath, dizziness.   BMI is Body mass index is 25.51 kg/m., she is working on diet and exercise. Wt Readings from Last 3 Encounters:  02/02/19 144 lb (65.3 kg)  10/26/18 148 lb (67.1 kg)  08/19/18 148 lb (67.1 kg)   She has osteoporosis, following with [name] for elevated calcium/PTH, had negative parathyroid scan, had a referral surgeon but is not having surgery at this time.  Lab Results  Component Value Date   PTH 37 08/26/2018   CALCIUM 11.0 (H) 08/25/2018   PHOS 2.9 08/26/2018   She has COPD, is on advair, She quit smoking Feb 2015, she smoked for 40 + years, last CXR 10/2018  She has Afib recently, following with Dr. [name], on xarelto and cardizem.  Has not had sleep study, insurance will not cover it and she states she does not wake up fatigued and declines.   She is on cholesterol medication due to myalgias with statins, she is on zetia, could not tolerate livalo.  Her cholesterol is at goal. The cholesterol last visit was:   Lab Results  Component Value Date   CHOL 227 (H) 05/27/2018   HDL 60 05/27/2018   LDLCALC 137 (H) 05/27/2018   TRIG 160 (H) 05/27/2018   CHOLHDL 3.8  05/27/2018   She has been working on diet and exercise for prediabetes,  and denies paresthesia of the feet, polydipsia, polyuria and visual disturbances. Last A1C in the office was:  Lab Results  Component Value Date   HGBA1C 5.9 (H)  05/27/2018   Patient is on Vitamin D supplement, 1000 IU daily   Lab Results  Component Value Date   VD25OH 62.69 08/26/2018     She is on celexa and wellbutrin for anxiety that helps.    Current Medications:  Current Outpatient Medications on File Prior to Visit  Medication Sig Dispense Refill  . albuterol (PROVENTIL HFA;VENTOLIN HFA) 108 (90 Base) MCG/ACT inhaler Use 2 inhalations 15 minutes apart every 4 hours to rescue Asthma 3 Inhaler 3  . azelastine (ASTELIN) 0.1 % nasal spray SPRAY 2 SPRAYS INTO EACH NOSTRIL TWICE A DAY AS DIRECTED 90 mL 3  . buPROPion (WELLBUTRIN XL) 300 MG 24 hr tablet Take 1 tablet (300 mg total) by mouth daily. 90 tablet 1  . Cholecalciferol (D-3-5) 125 MCG (5000 UT) capsule Take 5,000 Units by mouth daily.    . citalopram (CELEXA) 40 MG tablet Take 20 mg by mouth daily.    [name] diltiazem (CARDIZEM CD) 120 MG 24 hr capsule TAKE 1 CAPSULE BY MOUTH DAILY. PLEASE KEEP UPCOMING APPT FOR FUTURE REFILLS. THANK YOU 90 capsule 0  . diltiazem (CARDIZEM) 30 MG tablet TAKE 1 TABLET BY MOUTH AS NEEDED (FAST PALPITATIONS) 90 tablet 1  . ezetimibe (ZETIA) 10 MG tablet Take 1 tablet (10 mg total) by mouth daily. 90 tablet 1  . fluticasone (FLONASE) 50 MCG/ACT nasal spray PLACE 1 SPRAY INTO EACH NOSTRIL ONCE DAILY 48 g 3  . Fluticasone-Salmeterol (ADVAIR DISKUS) 250-50 MCG/DOSE AEPB INHALE 1 PUFF INTO THE LUNGS TWICE A DAY**RINSE MOUTH AFTER INHALATION 180 each 0  . guaiFENesin (MUCINEX) 600 MG 12 hr tablet Take 800 mg by mouth 3 (three) times daily.     . magnesium oxide (MAG-OX) 400 MG tablet Take 400 mg by mouth daily.    . montelukast (SINGULAIR) 10 MG tablet TAKE 1 TABLET BY MOUTH EVERYDAY AT BEDTIME 90 tablet 1  . OVER THE COUNTER MEDICATION Eye drop 1 drop each eye daily.    . rivaroxaban (XARELTO) 20 MG TABS tablet TAKE 1 TABLET BY MOUTH DAILY WITH SUPPER 90 tablet 1  . valACYclovir (VALTREX) 500 MG tablet Take 1 tablet (500 mg total) by mouth daily. 90 tablet 1    Current Facility-Administered Medications on File Prior to Visit  Medication Dose Route Frequency Provider Last Rate Last Dose  . ipratropium-albuterol (DUONEB) 0.5-2.5 (3) MG/3ML nebulizer solution 3 mL  3 mL Nebulization Once Liane Comber, NP       Health Maintenance:   Immunization History  Administered Date(s) Administered  . Influenza, High Dose Seasonal PF 08/10/2017  . Influenza,inj,Quad PF,6+ Mos 07/06/2018  . Influenza-Unspecified 08/10/2017, 07/06/2018  . PPD Test 01/26/2014  . Pneumococcal Conjugate-13 05/06/2017  . Pneumococcal Polysaccharide-23 08/19/2018  . Pneumococcal-Unspecified 12/25/2011  . Td 07/22/2006  . Tdap 05/02/2016  . Zoster 01/26/2014   Tdap:07/22/2016 Pneumovax: 2019 Prevnar 13 2018 Zostavax:01/26/14 Influenza: 2019  Colonoscopy: 2008 due 2018 wants to get cologuard Mammo: 12/2018 DIAGNOSITIC CAT C, needle Right BX Aug 2018  BMD: 09/2018 osteoporosis  Pap/ Pelvic: Nov 2015   CXR: 07/2015 Ct cardiac score 2013 CT lumbar 2011 Echo 2017 PFTs 09/2016  [id number] Dentist:q 6 months Patient Care Team: Vicie Mutters, Hershal Coria as PCP - General (Physician Assistant) Josue Hector,  MD as Consulting Physician (Cardiology) Inda Castle, MD (Inactive) as Consulting Physician (Gastroenterology) Lavonna Monarch, MD as Consulting Physician (Dermatology) Marygrace Drought, MD as Consulting Physician (Ophthalmology) Rolm Bookbinder, MD as Consulting Physician (General Surgery)  Medical History:  Past Medical History:  Diagnosis Date  . Anxiety   . Asthma   . COPD (chronic obstructive pulmonary disease) ([place])   . Coronary artery calcification seen on CT scan   . Depression   . Fatigue   . Former tobacco use   . Hyperlipidemia   . Paroxysmal atrial fibrillation (HCC)   . Pre-diabetes   . RBBB (right bundle branch block with left anterior fascicular block)   . Scoliosis   . Serum calcium elevated   . Thyroid disease    Allergies Allergies   Allergen Reactions  . Lipitor [Atorvastatin]     Myalgias   . Tetanus Toxoids Itching    SURGICAL HISTORY She  has a past surgical history that includes Tonsilectomy, adenoidectomy, bilateral myringotomy and tubes; Ovarian cyst removal; Appendectomy; LAPROSCOPIC; Radioactive seed guided excisional breast biopsy (Right, 05/15/2016); and Breast excisional biopsy (Right, 2017). FAMILY HISTORY Her family history includes Alcohol abuse in her father; Heart disease in her mother; Heart failure in her mother; Hyperlipidemia in her mother; Hypertension in her mother; Liver disease in her father. SOCIAL HISTORY  She  reports that she quit smoking about 5 years ago. Her smoking use included cigarettes. She has a 70.00 pack-year smoking history. She has never used smokeless tobacco. She reports current alcohol use. She reports that she does not use drugs.  MEDICARE WELLNESS OBJECTIVES: Physical activity: Current Exercise Habits: Home exercise routine, Type of exercise: walking, Time (Minutes): 30, Frequency (Times/Week): 4, Weekly Exercise (Minutes/Week): 120, Intensity: Mild Cardiac risk factors: Cardiac Risk Factors include: advanced age (>78men, >36 women);dyslipidemia;hypertension;sedentary lifestyle Depression/mood screen:   Depression screen [facility] 2/9 02/02/2019  Decreased Interest 0  Down, Depressed, Hopeless 0  PHQ - 2 Score 0    ADLs:  In your present state of health, do you have any difficulty performing the following activities: 02/02/2019  Hearing? N  Vision? N  Difficulty concentrating or making decisions? N  Walking or climbing stairs? N  Dressing or bathing? N  Doing errands, shopping? N  Some recent data might be hidden     Cognitive Testing  Alert? Yes  Normal Appearance?Yes  Oriented to person? Yes  Place? Yes   Time? Yes  Recall of three objects?  Yes  Can perform simple calculations? Yes  Displays appropriate judgment?Yes  Can read the correct time from a watch  face?Yes  EOL planning: Does Patient Have a Medical Advance Directive?: Yes Type of Advance Directive: [place] in Chart?: No - copy requested    Review of Systems: Review of Systems  Constitutional: Negative.   HENT: Positive for congestion. Negative for ear discharge, ear pain, hearing loss, nosebleeds, sore throat and tinnitus.   Respiratory: Positive for cough. Negative for hemoptysis, sputum production, shortness of breath, wheezing and stridor.   Gastrointestinal: Positive for heartburn. Negative for abdominal pain, blood in stool, constipation, diarrhea, melena, nausea and vomiting.  Genitourinary: Negative for dysuria, flank pain, frequency, hematuria and urgency.       + incontinence  Musculoskeletal: Negative.   Skin: Negative.   Neurological: Negative.  Negative for headaches.  Psychiatric/Behavioral: Negative.     Physical Exam: Estimated body mass index is 25.51 kg/m as calculated from the following:  Height as of this encounter: 5\' 3"  (1.6 m).   Weight as of this encounter: 144 lb (65.3 kg). BP 116/71   Pulse 66   Ht 5\' 3"  (1.6 m)   Wt 144 lb (65.3 kg)   BMI 25.51 kg/m  General Appearance:Well sounding, in no apparent distress.  ENT/Mouth: No hoarseness, No cough for duration of visit.  Respiratory: completing full sentences without distress, without audible wheeze Neuro: Awake and oriented X 3,  Psych:  Insight and Judgment appropriate.   Vicie Mutters 4:05 PM [facility]

## 2019-02-02 ENCOUNTER — Other Ambulatory Visit: Payer: Self-pay

## 2019-02-02 ENCOUNTER — Encounter: Payer: Self-pay | Admitting: Physician Assistant

## 2019-02-02 ENCOUNTER — Ambulatory Visit: Payer: Medicare Other | Admitting: Physician Assistant

## 2019-02-02 VITALS — BP 116/71 | HR 66 | Ht 63.0 in | Wt 144.0 lb

## 2019-02-02 DIAGNOSIS — I2510 Atherosclerotic heart disease of native coronary artery without angina pectoris: Secondary | ICD-10-CM

## 2019-02-02 DIAGNOSIS — J301 Allergic rhinitis due to pollen: Secondary | ICD-10-CM

## 2019-02-02 DIAGNOSIS — F3341 Major depressive disorder, recurrent, in partial remission: Secondary | ICD-10-CM | POA: Diagnosis not present

## 2019-02-02 DIAGNOSIS — Z1211 Encounter for screening for malignant neoplasm of colon: Secondary | ICD-10-CM

## 2019-02-02 DIAGNOSIS — R7309 Other abnormal glucose: Secondary | ICD-10-CM

## 2019-02-02 DIAGNOSIS — Z9189 Other specified personal risk factors, not elsewhere classified: Secondary | ICD-10-CM

## 2019-02-02 DIAGNOSIS — I700 Atherosclerosis of aorta: Secondary | ICD-10-CM

## 2019-02-02 DIAGNOSIS — E039 Hypothyroidism, unspecified: Secondary | ICD-10-CM | POA: Diagnosis not present

## 2019-02-02 DIAGNOSIS — Z0001 Encounter for general adult medical examination with abnormal findings: Secondary | ICD-10-CM

## 2019-02-02 DIAGNOSIS — E782 Mixed hyperlipidemia: Secondary | ICD-10-CM | POA: Diagnosis not present

## 2019-02-02 DIAGNOSIS — F419 Anxiety disorder, unspecified: Secondary | ICD-10-CM

## 2019-02-02 DIAGNOSIS — M8580 Other specified disorders of bone density and structure, unspecified site: Secondary | ICD-10-CM

## 2019-02-02 DIAGNOSIS — R6889 Other general symptoms and signs: Secondary | ICD-10-CM | POA: Diagnosis not present

## 2019-02-02 DIAGNOSIS — E559 Vitamin D deficiency, unspecified: Secondary | ICD-10-CM

## 2019-02-02 DIAGNOSIS — I452 Bifascicular block: Secondary | ICD-10-CM

## 2019-02-02 DIAGNOSIS — J45909 Unspecified asthma, uncomplicated: Secondary | ICD-10-CM

## 2019-02-02 DIAGNOSIS — Z0000 Encounter for general adult medical examination without abnormal findings: Secondary | ICD-10-CM

## 2019-02-02 DIAGNOSIS — I480 Paroxysmal atrial fibrillation: Secondary | ICD-10-CM

## 2019-02-13 ENCOUNTER — Other Ambulatory Visit: Payer: Self-pay | Admitting: Physician Assistant

## 2019-02-24 LAB — COLOGUARD

## 2019-02-25 ENCOUNTER — Other Ambulatory Visit: Payer: Self-pay | Admitting: Physician Assistant

## 2019-03-07 ENCOUNTER — Telehealth: Payer: Self-pay

## 2019-03-07 LAB — COLOGUARD: Cologuard: POSITIVE — AB

## 2019-03-07 NOTE — Telephone Encounter (Signed)
Call placed to Pt.  Pt is working with a new PCP at this time and currently wearing a heart monitor.  Pt wants to give this a try before she reconsiders the loop implant.  Pt and nurse agreed would be best to put in a 3 month recall to rediscuss at that time.

## 2019-03-16 ENCOUNTER — Telehealth: Payer: Self-pay | Admitting: Physician Assistant

## 2019-03-16 DIAGNOSIS — R195 Other fecal abnormalities: Secondary | ICD-10-CM

## 2019-03-16 NOTE — Telephone Encounter (Signed)
Diagnoses and all orders for this visit:  Positive colorectal cancer screening using Cologuard test -     Ambulatory referral to Gastroenterology   Last colonoscopy 2008 with Dr. Deatra Ina No family history of colon cancer No symptoms at this time.   Discussed false positive rate with cologuard but now that it is positive we will have to refer her to GI for further evaluation.   Family History  Problem Relation Age of Onset  . Heart failure Mother   . Hypertension Mother   . Hyperlipidemia Mother   . Heart disease Mother   . Liver disease Father   . Alcohol abuse Father

## 2019-03-20 ENCOUNTER — Other Ambulatory Visit: Payer: Self-pay | Admitting: Adult Health

## 2019-03-28 ENCOUNTER — Telehealth: Payer: Self-pay | Admitting: Internal Medicine

## 2019-03-28 ENCOUNTER — Other Ambulatory Visit: Payer: Self-pay | Admitting: Internal Medicine

## 2019-03-28 NOTE — Telephone Encounter (Signed)
PCP sent over a referral for patient to see Dr Rayann Heman due to heart monitor results. She is not a new patient to [facility] but would like an appointment to come into office and see him.

## 2019-03-29 ENCOUNTER — Other Ambulatory Visit: Payer: Self-pay | Admitting: Adult Health

## 2019-04-05 ENCOUNTER — Telehealth: Payer: Self-pay

## 2019-04-05 NOTE — Telephone Encounter (Addendum)
Spoke with pt regarding appt on 04/06/19. Pt stated she will check vitals prior to appt. Pt questions and concerns were address.

## 2019-04-06 ENCOUNTER — Telehealth (INDEPENDENT_AMBULATORY_CARE_PROVIDER_SITE_OTHER): Payer: Medicare Other | Admitting: Internal Medicine

## 2019-04-06 ENCOUNTER — Encounter: Payer: Self-pay | Admitting: Internal Medicine

## 2019-04-06 VITALS — BP 124/61 | HR 70 | Ht 63.0 in | Wt 147.0 lb

## 2019-04-06 DIAGNOSIS — I480 Paroxysmal atrial fibrillation: Secondary | ICD-10-CM | POA: Diagnosis not present

## 2019-04-06 NOTE — Progress Notes (Signed)
[name]     Electrophysiology TeleHealth Note   Due to national recommendations of social distancing due to COVID 19, an audio/video telehealth visit is felt to be most appropriate for this patient at this time.  See MyChart message from today for the patient's consent to telehealth for [facility].   Date:  04/06/2019   ID:  Kathryn Booker, Kathryn Booker August 13, 1951, MRN [id number]  Location: patient's home  Provider location:  [facility]  Evaluation Performed: Follow-up visit  PCP:  Deland Pretty, MD   Electrophysiologist:  Dr Rayann Heman  Chief Complaint:  palpitations  History of Present Illness:    Kathryn Booker is a 68 y.o. female who presents via telehealth conferencing today.  Since last being seen in our clinic, the patient reports doing very well.  She recently had a monitor placed by Dr Shelia Media.  This has documented afib daily.  She reports symptoms of palpitations and fatigue.  She has mild lightheadedness with this.  Today, she denies symptoms of chest pain, shortness of breath,  lower extremity edema, dizziness, presyncope, or syncope.  The patient is otherwise without complaint today.  The patient denies symptoms of fevers, chills, cough, or new SOB worrisome for COVID 19.  Past Medical History:  Diagnosis Date  . Anxiety   . Asthma   . COPD (chronic obstructive pulmonary disease) ([place])   . Coronary artery calcification seen on CT scan   . Depression   . Fatigue   . Former tobacco use   . Hyperlipidemia   . Paroxysmal atrial fibrillation (HCC)   . Pre-diabetes   . RBBB (right bundle branch block with left anterior fascicular block)   . Scoliosis   . Serum calcium elevated   . Thyroid disease     Past Surgical History:  Procedure Laterality Date  . APPENDECTOMY    . BREAST EXCISIONAL BIOPSY Right 2017   benign  . LAPROSCOPIC     X 2 IN EARLY 30-40'S  . OVARIAN CYST REMOVAL    . RADIOACTIVE SEED GUIDED EXCISIONAL BREAST BIOPSY Right 05/15/2016   Procedure:  RIGHT RADIOACTIVE SEED GUIDED EXCISIONAL BREAST BIOPSY;  Surgeon: Rolm Bookbinder, MD;  Location: [place];  Service: General;  Laterality: Right;  RIGHT RADIOACTIVE SEED GUIDED EXCISIONAL BREAST BIOPSY  . TONSILECTOMY, ADENOIDECTOMY, BILATERAL MYRINGOTOMY AND TUBES      Current Outpatient Medications  Medication Sig Dispense Refill  . albuterol (PROVENTIL HFA;VENTOLIN HFA) 108 (90 Base) MCG/ACT inhaler Use 2 inhalations 15 minutes apart every 4 hours to rescue Asthma 3 Inhaler 3  . azelastine (ASTELIN) 0.1 % nasal spray SPRAY 2 SPRAYS INTO EACH NOSTRIL TWICE A DAY AS DIRECTED 90 mL 3  . buPROPion (WELLBUTRIN XL) 300 MG 24 hr tablet Take 1 tablet (300 mg total) by mouth daily. 90 tablet 1  . citalopram (CELEXA) 40 MG tablet Take 20 mg by mouth daily.    [name] diltiazem (CARDIZEM CD) 120 MG 24 hr capsule TAKE 1 CAPSULE BY MOUTH DAILY. PLEASE KEEP UPCOMING APPT FOR FUTURE REFILLS. THANK YOU 90 capsule 0  . diltiazem (CARDIZEM) 30 MG tablet TAKE 1 TABLET BY MOUTH AS NEEDED (FAST PALPITATIONS) 90 tablet 1  . ezetimibe (ZETIA) 10 MG tablet TAKE 1 TABLET BY MOUTH EVERY DAY 90 tablet 1  . fluticasone (FLONASE) 50 MCG/ACT nasal spray PLACE 1 SPRAY INTO EACH NOSTRIL ONCE DAILY 48 g 3  . Fluticasone-Salmeterol (ADVAIR DISKUS) 250-50 MCG/DOSE AEPB INHALE 1 PUFF INTO THE LUNGS TWICE A DAY**RINSE MOUTH AFTER INHALATION  180 each 0  . magnesium oxide (MAG-OX) 400 MG tablet Take 400 mg by mouth daily.    . montelukast (SINGULAIR) 10 MG tablet TAKE 1 TABLET BY MOUTH EVERYDAY AT BEDTIME 90 tablet 1  . OVER THE COUNTER MEDICATION Eye drop 1 drop each eye daily.    . valACYclovir (VALTREX) 500 MG tablet TAKE 1 TABLET BY MOUTH EVERY DAY 90 tablet 1  . XARELTO 20 MG TABS tablet TAKE 1 TABLET BY MOUTH DAILY WITH SUPPER 90 tablet 1   Current Facility-Administered Medications  Medication Dose Route Frequency Provider Last Rate Last Dose  . ipratropium-albuterol (DUONEB) 0.5-2.5 (3) MG/3ML nebulizer  solution 3 mL  3 mL Nebulization Once Liane Comber, NP        Allergies:   Lipitor [atorvastatin] and Tetanus toxoids   Social History:  The patient  reports that she quit smoking about 5 years ago. Her smoking use included cigarettes. She has a 70.00 pack-year smoking history. She has never used smokeless tobacco. She reports current alcohol use. She reports that she does not use drugs.   Family History:  The patient's  family history includes Alcohol abuse in her father; Heart disease in her mother; Heart failure in her mother; Hyperlipidemia in her mother; Hypertension in her mother; Liver disease in her father.   ROS:  Please see the history of present illness.   All other systems are personally reviewed and negative.    Exam:    Vital Signs:  BP 124/61   Pulse 70   Ht 5\' 3"  (1.6 m)   Wt 147 lb (66.7 kg)   BMI 26.04 kg/m   Well appearing today, OP clear, normal WOB   Labs/Other Tests and Data Reviewed:    Recent Labs: 05/27/2018: ALT 35; Hemoglobin 14.6; Platelets 249; TSH 2.09 08/25/2018: BUN 19; Creat 0.83; Potassium 4.9; Sodium 137 08/26/2018: Magnesium 2.1   Wt Readings from Last 3 Encounters:  04/06/19 147 lb (66.7 kg)  02/02/19 144 lb (65.3 kg)  10/26/18 148 lb (67.1 kg)      ASSESSMENT & PLAN:    1.  Paroxysmal atrial fibrillation Recently documented on short term monitor documented afib We had talked about the benefits of long term monitor with an implanted loop recorder.  Unfortunately, she wore a short term monitor instead.  I do think that a better option for characterization her response to medical therapy and long term management of her AF would be better with an ILR.  She will continue to think about this option. The patient has symptomatic, recurrent paroxysmal atrial fibrillation. she has failed medical therapy with multaq previously Chads2vasc score is 2.  she is anticoagulated with xarelto . Therapeutic strategies for afib including medicine and  ablation were discussed in detail with the patient today. Risk, benefits, and alternatives to each approach were discussed.  I think ablation would be best.  Her medicine options would be tikosyn or amiodarone.  She did not tolerate multaq due to concerns of alopecia.  I would not advise ICs given her conduction system disease.  For now, she wishes to continue her current strategy.   Follow-up:  2-3 months with EP PA  Patient Risk:  after full review of this patients clinical status, I feel that they are at moderate risk at this time.  Today, I have spent 15 minutes with the patient with telehealth technology discussing arrhythmia management .    Signed, Thompson Grayer, MD  04/06/2019 11:10 AM     [facility]  [address] [place] [address] [phone] (office) [phone] (fax)

## 2019-04-12 ENCOUNTER — Other Ambulatory Visit: Payer: Self-pay | Admitting: Physician Assistant

## 2019-04-13 NOTE — Progress Notes (Signed)
Prescreened pt for 7-9 visit. 

## 2019-04-14 ENCOUNTER — Telehealth: Payer: Self-pay

## 2019-04-14 ENCOUNTER — Ambulatory Visit (INDEPENDENT_AMBULATORY_CARE_PROVIDER_SITE_OTHER): Payer: Medicare Other | Admitting: Gastroenterology

## 2019-04-14 ENCOUNTER — Other Ambulatory Visit: Payer: Self-pay

## 2019-04-14 ENCOUNTER — Encounter: Payer: Self-pay | Admitting: Gastroenterology

## 2019-04-14 DIAGNOSIS — R195 Other fecal abnormalities: Secondary | ICD-10-CM | POA: Diagnosis not present

## 2019-04-14 DIAGNOSIS — Z7901 Long term (current) use of anticoagulants: Secondary | ICD-10-CM | POA: Diagnosis not present

## 2019-04-14 MED ORDER — SUPREP BOWEL PREP KIT 17.5-3.13-1.6 GM/177ML PO SOLN: ORAL | 0 refills | Status: DC

## 2019-04-14 NOTE — Telephone Encounter (Signed)
Pt takes Xarelto for afib with CHADS2VASc score of 3 (age, sex, CAD). Renal function is normal. Ok to hold Xarelto 2 days prior as requested.

## 2019-04-14 NOTE — Telephone Encounter (Signed)
PharmD to review how long to hold Xarelto

## 2019-04-14 NOTE — Telephone Encounter (Signed)
[facility] Pre-operative Risk Assessment     Request for surgical clearance:     Endoscopy Procedure  What type of surgery is being performed?     COLONOSCOPY  When is this surgery scheduled?     05-02-2019  What type of clearance is required ?   Pharmacy  Are there any medications that need to be held prior to surgery and how long? [place] 2 DAYS  Practice name and name of physician performing surgery?  [facility]  What is your office phone and fax number?      Phone- [phone]  Fax- [phone] Please contact Lemar Lofty, CMA  Anesthesia type (None, local, MAC, general) ?       MAC  Thank you!

## 2019-04-14 NOTE — Telephone Encounter (Signed)
   Primary Cardiologist: Dr. Rayann Heman  Chart reviewed as part of pre-operative protocol coverage. Given past medical history and time since last visit, based on ACC/AHA guidelines, Kathryn Booker would be at acceptable risk for the planned procedure without further cardiovascular testing.   I will route this recommendation to the requesting party via Epic fax function and remove from pre-op pool.  Please call with questions. Per clinical pharmacist, patient may hold Xarelto for 2 days prior to the procedure and restart it as soon as possible after the procedure.   [place], [place] 04/14/2019, 4:56 PM

## 2019-04-14 NOTE — Progress Notes (Signed)
THIS ENCOUNTER IS A VIRTUAL VISIT DUE TO COVID-19 - PATIENT WAS NOT SEEN IN THE OFFICE. PATIENT HAS CONSENTED TO VIRTUAL VISIT / TELEMEDICINE VISIT. WE ATTEMPTED DOXIMITY HOWEVER THE PATIENT COULD NOT GET HER PHONE TO WORK WITH THIS AND VISUAL CAPABILITY NOT POSSIBLE, WE PROCEEDED AS PHONE ONLY VISIT   Location of patient: home Location of provider: office Name of referring provider: Vicie Mutters PA  Persons participating: myself, patient Time spent: 21 minutes  HPI :  68 y/o female with a history of COPD, atrial fibrillation on Xarelto, referred by Vicie Mutters PA for a positive Cologuard  Her last colonoscopy was in 2008, no polyps noted. She had a positive Cologuard earlier this month.  No trouble with bowels currently. She is not seeing any blood in the stools. No trouble with diarrhea or constipation at baseline. No abdominal pains. No family history of known colon cancer. Feeling well otherwise, no other complaints. She takes Xarelto of atrial fibrillation, no history of stroke. No history of CAD or CHF. Last GFR in 70s.  Last colonoscopy in 2008, no polyps noted.  Echocardiogram 09/26/2016 - EF normal, mild AS  (+) Cologuard - 03/07/19  Colonoscopy 09/07/2007 - diverticulosis, no polyps   Past Medical History:  Diagnosis Date  . Allergies   . Anxiety   . Asthma   . COPD (chronic obstructive pulmonary disease) ([facility])   . Coronary artery calcification seen on CT scan   . Depression   . Fatigue   . Former tobacco use   . Hyperlipidemia   . Paroxysmal atrial fibrillation ([facility])   . Pre-diabetes   . RBBB (right bundle branch block with left anterior fascicular block)   . Scoliosis   . Serum calcium elevated   . Thyroid disease    "questionable" per patient     Past Surgical History:  Procedure Laterality Date  . APPENDECTOMY    . BREAST EXCISIONAL BIOPSY Right 2017   benign  . LAPROSCOPIC     X 2 IN EARLY 30-40'S  . OVARIAN CYST REMOVAL    . RADIOACTIVE SEED  GUIDED EXCISIONAL BREAST BIOPSY Right 05/15/2016   Procedure: RIGHT RADIOACTIVE SEED GUIDED EXCISIONAL BREAST BIOPSY;  Surgeon: Rolm Bookbinder, MD;  Location: [facility];  Service: General;  Laterality: Right;  RIGHT RADIOACTIVE SEED GUIDED EXCISIONAL BREAST BIOPSY  . TONSILECTOMY, ADENOIDECTOMY, BILATERAL MYRINGOTOMY AND TUBES     Family History  Problem Relation Age of Onset  . Heart failure Mother   . Hypertension Mother   . Hyperlipidemia Mother   . Heart disease Mother   . Liver disease Father   . Alcohol abuse Father   . Colon cancer Neg Hx   . Esophageal cancer Neg Hx   . Stomach cancer Neg Hx    Social History   Tobacco Use  . Smoking status: Former Smoker    Packs/day: 2.00    Years: 35.00    Pack years: 70.00    Types: Cigarettes    Quit date: 11/19/2013    Years since quitting: 5.4  . Smokeless tobacco: Never Used  Substance Use Topics  . Alcohol use: Yes    Alcohol/week: 0.0 standard drinks    Comment: social  . Drug use: No   Current Outpatient Medications  Medication Sig Dispense Refill  . albuterol (PROVENTIL HFA;VENTOLIN HFA) 108 (90 Base) MCG/ACT inhaler Use 2 inhalations 15 minutes apart every 4 hours to rescue Asthma 3 Inhaler 3  . azelastine (ASTELIN) 0.1 % nasal spray  SPRAY 2 SPRAYS INTO EACH NOSTRIL TWICE A DAY AS DIRECTED 90 mL 3  . buPROPion (WELLBUTRIN XL) 300 MG 24 hr tablet Take 1 tablet (300 mg total) by mouth daily. 90 tablet 1  . cetirizine (ZYRTEC) 10 MG tablet Take 10 mg by mouth daily.    . citalopram (CELEXA) 40 MG tablet Take 20 mg by mouth daily.    [name] diltiazem (CARDIZEM CD) 120 MG 24 hr capsule TAKE 1 CAPSULE BY MOUTH DAILY. PLEASE KEEP UPCOMING APPT FOR FUTURE REFILLS. THANK YOU 90 capsule 0  . diltiazem (CARDIZEM) 30 MG tablet TAKE 1 TABLET BY MOUTH AS NEEDED (FAST PALPITATIONS) 90 tablet 1  . ezetimibe (ZETIA) 10 MG tablet TAKE 1 TABLET BY MOUTH EVERY DAY 90 tablet 1  . fluticasone (FLONASE) 50 MCG/ACT nasal spray  PLACE 1 SPRAY INTO EACH NOSTRIL ONCE DAILY 48 g 3  . ipratropium (ATROVENT) 0.06 % nasal spray Place 2 sprays into both nostrils 2 (two) times daily.    . magnesium oxide (MAG-OX) 400 MG tablet Take 400 mg by mouth daily.    . montelukast (SINGULAIR) 10 MG tablet TAKE 1 TABLET BY MOUTH EVERYDAY AT BEDTIME 90 tablet 1  . OVER THE COUNTER MEDICATION Eye drop 1 drop each eye daily.    . valACYclovir (VALTREX) 500 MG tablet TAKE 1 TABLET BY MOUTH EVERY DAY 90 tablet 1  . WIXELA INHUB 250-50 MCG/DOSE AEPB INHALE 1 PUFF INTO THE LUNGS TWICE A DAY**RINSE MOUTH AFTER INHALATION 180 each 1  . XARELTO 20 MG TABS tablet TAKE 1 TABLET BY MOUTH DAILY WITH SUPPER 90 tablet 1   No current facility-administered medications for this visit.    Allergies  Allergen Reactions  . Lipitor [Atorvastatin]     Myalgias   . Tetanus Toxoids Itching     Review of Systems: All systems reviewed and negative except where noted in HPI.   Lab Results  Component Value Date   WBC 6.3 05/27/2018   HGB 14.6 05/27/2018   HCT 42.9 05/27/2018   MCV 95.1 05/27/2018   PLT 249 05/27/2018    Lab Results  Component Value Date   CREATININE 0.83 08/25/2018   BUN 19 08/25/2018   NA 137 08/25/2018   K 4.9 08/25/2018   CL 103 08/25/2018   CO2 24 08/25/2018    Lab Results  Component Value Date   ALT 35 (H) 05/27/2018   AST 23 05/27/2018   ALKPHOS 74 05/06/2017   BILITOT 0.8 05/27/2018      Physical Exam: Constitutional: Pleasant,well-developed, female in no acute distress. Psychiatric: Normal mood and affect. Behavior is normal.   ASSESSMENT AND PLAN: 68 y/o female here for a new patient assessment of the following:  Positive Cologuard / Anticoagulated - prior negative colonoscopy in 2008, she is asymptomatic from lower GI tract perspective. On Xarelto for AF, presenting with a positive Cologuard. I discussed ddx for positive cologuard with her, and recommend an optical colonoscopy to further evaluate. I  discussed risks / benefits of colonoscopy and anesthesia with her, and increased risk for bleeding if on Xarelto. Given her GFR in the 70s will need to hold the Xarelto for 2 days prior to the procedure, will reach out to her Cardiologist for approval. She wanted to proceed following discussion of the issue, further recommendations pending the results.   Total time spent 21 minutes.   Cellar, MD [facility]  CC: Vicie Mutters, PA-C

## 2019-04-14 NOTE — Patient Instructions (Signed)
If you are age 67 or older, your body mass index should be between 23-30. Your There is no height or weight on file to calculate BMI. If this is out of the aforementioned range listed, please consider follow up with your Primary Care Provider.  If you are age 17 or younger, your body mass index should be between 19-25. Your There is no height or weight on file to calculate BMI. If this is out of the aformentioned range listed, please consider follow up with your Primary Care Provider.   To help prevent the possible spread of infection to our patients, communities, and staff; we will be implementing the following measures:  As of now we are not allowing any visitors/family members to accompany you to any upcoming appointments with [facility]. If you have any concerns about this please contact our office to discuss prior to the appointment.   You have been scheduled for a colonoscopy. Please follow written instructions given to you at your visit today.  Please pick up your prep supplies at the pharmacy within the next 1-3 days. If you use inhalers (even only as needed), please bring them with you on the day of your procedure. Your physician has requested that you go to www.startemmi.com and enter the access code given to you at your visit today. This web site gives a general overview about your procedure. However, you should still follow specific instructions given to you by our office regarding your preparation for the procedure.   You will be contacted by our office prior to your procedure for directions on holding your Xarelto.  If you do not hear from our office 1 week prior to your scheduled procedure, please call [phone] to discuss.   Thank you for entrusting me with your care and for choosing [facility], Dr. Quapaw Cellar

## 2019-04-19 ENCOUNTER — Other Ambulatory Visit: Payer: Self-pay

## 2019-04-19 NOTE — Telephone Encounter (Signed)
Called and LM for pt to hold Xarelto on July 25th and 26th for procedure on July 27th. Asked her to call back to confirm receipt of message and understanding. Called and spoke to pt on her cell phone.  She confirmed understanding.

## 2019-04-29 ENCOUNTER — Telehealth: Payer: Self-pay | Admitting: Gastroenterology

## 2019-04-29 NOTE — Telephone Encounter (Signed)
Patient called back this evening and has procedure Monday. I have completed the [place] screening questions as on call MD  Do you now or have you had a fever in the last 14 days? NO  Do you have any respiratory symptoms of shortness of breath or cough now or in the last 14 days? Chronic asthma and allergy symptoms, occasional dyspnea which she will use inhalers.  Post-nasal drip.  Do you have any family members or close contacts with diagnosed or suspected Covid-19 in the past 14 days? NO  Have you been tested for Covid-19 and found to be positive? NO  --I have completed the survey and patient deemed appropriate for colonoscopy as scheduled with Dr. Havery Moros on Monday. She has already received instruction on how to hold her Xarelto

## 2019-04-29 NOTE — Telephone Encounter (Signed)

## 2019-04-29 NOTE — Telephone Encounter (Signed)
Thanks Jay!

## 2019-05-02 ENCOUNTER — Ambulatory Visit (AMBULATORY_SURGERY_CENTER): Payer: Medicare Other | Admitting: Gastroenterology

## 2019-05-02 ENCOUNTER — Other Ambulatory Visit: Payer: Self-pay

## 2019-05-02 ENCOUNTER — Encounter: Payer: Self-pay | Admitting: Gastroenterology

## 2019-05-02 VITALS — BP 117/60 | HR 61 | Temp 98.7°F | Resp 18 | Ht 63.0 in | Wt 144.0 lb

## 2019-05-02 DIAGNOSIS — D123 Benign neoplasm of transverse colon: Secondary | ICD-10-CM

## 2019-05-02 DIAGNOSIS — K5730 Diverticulosis of large intestine without perforation or abscess without bleeding: Secondary | ICD-10-CM

## 2019-05-02 DIAGNOSIS — D171 Benign lipomatous neoplasm of skin and subcutaneous tissue of trunk: Secondary | ICD-10-CM | POA: Diagnosis not present

## 2019-05-02 DIAGNOSIS — K648 Other hemorrhoids: Secondary | ICD-10-CM

## 2019-05-02 DIAGNOSIS — D125 Benign neoplasm of sigmoid colon: Secondary | ICD-10-CM | POA: Diagnosis not present

## 2019-05-02 DIAGNOSIS — R195 Other fecal abnormalities: Secondary | ICD-10-CM | POA: Diagnosis not present

## 2019-05-02 MED ORDER — SODIUM CHLORIDE 0.9 % IV SOLN: 500.0000 mL | Freq: Once | INTRAVENOUS | Status: DC

## 2019-05-02 NOTE — Patient Instructions (Signed)
Thank you for allowing [place] to care for you today!  Await pathology results by mail, approximately 2 weeks.  Recommendation for next colonoscopy will be made at that time.  Resume previous diet and medications today.  Resume Xarelto tomorrow.  Return to your normal activities tomorrow.    YOU HAD AN ENDOSCOPIC PROCEDURE TODAY AT [facility]:   Refer to the procedure report that was given to you for any specific questions about what was found during the examination.  If the procedure report does not answer your questions, please call your gastroenterologist to clarify.  If you requested that your care partner not be given the details of your procedure findings, then the procedure report has been included in a sealed envelope for you to review at your convenience later.  YOU SHOULD EXPECT: Some feelings of bloating in the abdomen. Passage of more gas than usual.  Walking can help get rid of the air that was put into your GI tract during the procedure and reduce the bloating. If you had a lower endoscopy (such as a colonoscopy or flexible sigmoidoscopy) you may notice spotting of blood in your stool or on the toilet paper. If you underwent a bowel prep for your procedure, you may not have a normal bowel movement for a few days.  Please Note:  You might notice some irritation and congestion in your nose or some drainage.  This is from the oxygen used during your procedure.  There is no need for concern and it should clear up in a day or so.  SYMPTOMS TO REPORT IMMEDIATELY:   Following lower endoscopy (colonoscopy or flexible sigmoidoscopy):  Excessive amounts of blood in the stool  Significant tenderness or worsening of abdominal pains  Swelling of the abdomen that is new, acute  Fever of 100F or higher       For urgent or emergent issues, a gastroenterologist can be reached at any hour by calling [phone].   DIET:  We do recommend a small meal at first, but then you  may proceed to your regular diet.  Drink plenty of fluids but you should avoid alcoholic beverages for 24 hours.  ACTIVITY:  You should plan to take it easy for the rest of today and you should NOT DRIVE or use heavy machinery until tomorrow (because of the sedation medicines used during the test).    FOLLOW UP: Our staff will call the number listed on your records 48-72 hours following your procedure to check on you and address any questions or concerns that you may have regarding the information given to you following your procedure. If we do not reach you, we will leave a message.  We will attempt to reach you two times.  During this call, we will ask if you have developed any symptoms of COVID 19. If you develop any symptoms (ie: fever, flu-like symptoms, shortness of breath, cough etc.) before then, please call [phone].  If you test positive for Covid 19 in the 2 weeks post procedure, please call and report this information to [place].    If any biopsies were taken you will be contacted by phone or by letter within the next 1-3 weeks.  Please call us at [phone] if you have not heard about the biopsies in 3 weeks.    SIGNATURES/CONFIDENTIALITY: You and/or your care partner have signed paperwork which will be entered into your electronic medical record.  These signatures attest to the fact that that the information  above on your After Visit Summary has been reviewed and is understood.  Full responsibility of the confidentiality of this discharge information lies with you and/or your care-partner. 

## 2019-05-02 NOTE — Progress Notes (Signed)
Called to room to assist during endoscopic procedure.  Patient ID and intended procedure confirmed with present staff. Received instructions for my participation in the procedure from the performing physician.  

## 2019-05-02 NOTE — Op Note (Signed)
[facility] Patient Name: Kathryn Booker Procedure Date: 05/02/2019 9:15 AM MRN: [id number] Endoscopist: Remo Lipps P. Havery Moros , MD Age: 68 Referring MD:  Date of Birth: 1950-11-03 Gender: Female Account #: [id number] Procedure:                Colonoscopy Indications:              Positive Cologuard test Medicines:                Monitored Anesthesia Care Procedure:                Pre-Anesthesia Assessment:                           - Prior to the procedure, a History and Physical                            was performed, and patient medications and                            allergies were reviewed. The patient's tolerance of                            previous anesthesia was also reviewed. The risks                            and benefits of the procedure and the sedation                            options and risks were discussed with the patient.                            All questions were answered, and informed consent                            was obtained. Prior Anticoagulants: The patient has                            taken Xarelto (rivaroxaban), last dose was 2 days                            prior to procedure. ASA Grade Assessment: III - A                            patient with severe systemic disease. After                            reviewing the risks and benefits, the patient was                            deemed in satisfactory condition to undergo the                            procedure.  After obtaining informed consent, the colonoscope                            was passed under direct vision. Throughout the                            procedure, the patient's blood pressure, pulse, and                            oxygen saturations were monitored continuously. The                            Colonoscope was introduced through the anus and                            advanced to the the cecum, identified by      appendiceal orifice and ileocecal valve. The                            colonoscopy was performed without difficulty. The                            patient tolerated the procedure well. The quality                            of the bowel preparation was adequate. The                            ileocecal valve, appendiceal orifice, and rectum                            were photographed. Scope In: 9:20:56 AM Scope Out: 9:48:11 AM Scope Withdrawal Time: 0 hours 22 minutes 36 seconds  Total Procedure Duration: 0 hours 27 minutes 15 seconds  Findings:                 Hemorrhoids were found on perianal exam.                           The terminal ileum appeared normal.                           A 6 mm polyp was found in the transverse colon. The                            polyp was sessile. The polyp was removed with a                            cold snare. Resection and retrieval were complete.                           A suspected benign pedunculated pseudopolyp was                            found in the  transverse colon. The polyp was                            removed with a cold snare. Resection and retrieval                            were complete. The site was observed for a few                            minutes post polypectomy and no obvious bleeding                            occured.                           A diminutive polyp was found in the sigmoid colon.                            The polyp was sessile. The polyp was removed with a                            cold snare. Resection and retrieval were complete.                           There was a medium-sized lipoma, in the ascending                            colon.                           Internal hemorrhoids were found during retroflexion.                           Multiple medium-mouthed diverticula were found in                            the entire colon.                           The exam was otherwise without  abnormality. Complications:            No immediate complications. Estimated blood loss:                            Minimal. Estimated Blood Loss:     Estimated blood loss was minimal. Impression:               - Hemorrhoids found on perianal exam.                           - The examined portion of the ileum was normal.                           - One 6 mm polyp in the transverse colon, removed  with a cold snare. Resected and retrieved.                           - One suspected pseudopolyp in the transverse                            colon, removed with a cold snare. Resected and                            retrieved.                           - One diminutive polyp in the sigmoid colon,                            removed with a cold snare. Resected and retrieved.                           - Medium-sized lipoma in the ascending colon.                           - Internal hemorrhoids.                           - Diverticulosis in the entire examined colon.                           - The examination was otherwise normal. Recommendation:           - Patient has a contact number available for                            emergencies. The signs and symptoms of potential                            delayed complications were discussed with the                            patient. Return to normal activities tomorrow.                            Written discharge instructions were provided to the                            patient.                           - Resume previous diet.                           - Continue present medications.                           - Resume Xarelto tomorrow                           - Await pathology results.  Remo Lipps P. Ellery Meroney, MD 05/02/2019 9:55:13 AM This report has been signed electronically.

## 2019-05-02 NOTE — Progress Notes (Signed)
PT taken to PACU. Monitors in place. VSS. Report given to RN. 

## 2019-05-02 NOTE — Progress Notes (Signed)
Pt's states no medical or surgical changes since previsit or office visit.  JM temps CW vitals

## 2019-05-04 ENCOUNTER — Telehealth: Payer: Self-pay

## 2019-05-04 NOTE — Telephone Encounter (Signed)
  Follow up Call-  Call back number 05/02/2019  Post procedure Call Back phone  # [phone]  Permission to leave phone message Yes  Some recent data might be hidden     Patient questions:  Do you have a fever, pain , or abdominal swelling? No. Pain Score  0 *  Have you tolerated food without any problems? Yes.    Have you been able to return to your normal activities? Yes.    Do you have any questions about your discharge instructions: Diet   No. Medications  No. Follow up visit  No.  Do you have questions or concerns about your Care? No.  Actions: * If pain score is 4 or above: No action needed, pain <4.  1. Have you developed a fever since your procedure? no  2.   Have you had an respiratory symptoms (SOB or cough) since your procedure? no  3.   Have you tested positive for COVID 19 since your procedure no  4.   Have you had any family members/close contacts diagnosed with the COVID 19 since your procedure?  no   If yes to any of these questions please route to Joylene John, RN and Alphonsa Gin, Therapist, sports.

## 2019-05-05 ENCOUNTER — Other Ambulatory Visit: Payer: Self-pay | Admitting: Physician Assistant

## 2019-05-31 ENCOUNTER — Encounter: Payer: Self-pay | Admitting: Physician Assistant

## 2019-06-01 ENCOUNTER — Encounter: Payer: Self-pay | Admitting: Physician Assistant

## 2019-06-23 ENCOUNTER — Other Ambulatory Visit: Payer: Self-pay | Admitting: Physician Assistant

## 2019-06-29 ENCOUNTER — Other Ambulatory Visit: Payer: Self-pay | Admitting: Physician Assistant

## 2019-06-30 ENCOUNTER — Other Ambulatory Visit: Payer: Self-pay | Admitting: Internal Medicine

## 2019-07-03 NOTE — Progress Notes (Signed)
Electrophysiology Office Note Date: 07/04/2019  ID:  Kathryn Booker, Kathryn Booker 06-11-1951, MRN [id number]  PCP: Deland Pretty, MD Electrophysiologist: Rayann Heman  CC: AF Follow up  Kathryn Booker is a 68 y.o. female seen today for Dr Rayann Heman.  She presents today for routine electrophysiology followup.  Since last being seen in our clinic, the patient reports doing relatively well.  Per last holter monitor, she had around 7% AF burden. She feels since wearing that monitor, she has had some increase in AF and RVR.  She denies chest pain, dyspnea, PND, orthopnea, nausea, vomiting, dizziness, syncope, edema, weight gain, or early satiety.  Past Medical History:  Diagnosis Date  . Allergies   . Anxiety   . Asthma   . COPD (chronic obstructive pulmonary disease) ([place])   . Coronary artery calcification seen on CT scan   . Depression   . Fatigue   . Former tobacco use   . Hyperlipidemia   . Paroxysmal atrial fibrillation (H[facility])   . Pre-diabetes   . RBBB (right bundle branch block with left anterior fascicular block)   . Scoliosis   . Serum calcium elevated   . Thyroid disease    "questionable" per patient   Past Surgical History:  Procedure Laterality Date  . APPENDECTOMY    . BREAST EXCISIONAL BIOPSY Right 2017   benign  . LAPROSCOPIC     X 2 IN EARLY 30-40'S  . OVARIAN CYST REMOVAL    . RADIOACTIVE SEED GUIDED EXCISIONAL BREAST BIOPSY Right 05/15/2016   Procedure: RIGHT RADIOACTIVE SEED GUIDED EXCISIONAL BREAST BIOPSY;  Surgeon: Rolm Bookbinder, MD;  Location: [facility];  Service: General;  Laterality: Right;  RIGHT RADIOACTIVE SEED GUIDED EXCISIONAL BREAST BIOPSY  . TONSILECTOMY, ADENOIDECTOMY, BILATERAL MYRINGOTOMY AND TUBES      Current Outpatient Medications  Medication Sig Dispense Refill  . albuterol (PROVENTIL HFA;VENTOLIN HFA) 108 (90 Base) MCG/ACT inhaler Use 2 inhalations 15 minutes apart every 4 hours to rescue Asthma 3 Inhaler 3  . azelastine  (ASTELIN) 0.1 % nasal spray SPRAY 2 SPRAYS INTO EACH NOSTRIL TWICE A DAY AS DIRECTED 90 mL 3  . buPROPion (WELLBUTRIN XL) 300 MG 24 hr tablet TAKE 1 TABLET BY MOUTH EVERY DAY 90 tablet 0  . cetirizine (ZYRTEC) 10 MG tablet Take 10 mg by mouth daily.    . citalopram (CELEXA) 40 MG tablet Take 20 mg by mouth daily.    [name] diltiazem (CARDIZEM CD) 120 MG 24 hr capsule Take 1 capsule (120 mg total) by mouth daily. 90 capsule 2  . diltiazem (CARDIZEM) 30 MG tablet TAKE 1 TABLET BY MOUTH AS NEEDED (FAST PALPITATIONS) 90 tablet 1  . ezetimibe (ZETIA) 10 MG tablet TAKE 1 TABLET BY MOUTH EVERY DAY 90 tablet 1  . fluticasone (FLONASE) 50 MCG/ACT nasal spray PLACE 1 SPRAY INTO EACH NOSTRIL ONCE DAILY 48 g 3  . ipratropium (ATROVENT) 0.06 % nasal spray Place 2 sprays into both nostrils 2 (two) times daily.    . magnesium oxide (MAG-OX) 400 MG tablet Take 400 mg by mouth daily.    . montelukast (SINGULAIR) 10 MG tablet TAKE 1 TABLET BY MOUTH EVERYDAY AT BEDTIME 90 tablet 1  . OVER THE COUNTER MEDICATION Eye drop 1 drop each eye daily.    . valACYclovir (VALTREX) 500 MG tablet TAKE 1 TABLET BY MOUTH EVERY DAY 90 tablet 1  . WIXELA INHUB 250-50 MCG/DOSE AEPB INHALE 1 PUFF INTO THE LUNGS TWICE A DAY**RINSE MOUTH AFTER INHALATION  180 each 1  . XARELTO 20 MG TABS tablet TAKE 1 TABLET BY MOUTH DAILY WITH SUPPER 90 tablet 1   No current facility-administered medications for this visit.     Allergies:   Lipitor [atorvastatin] and Tetanus toxoids   Social History: Social History   Socioeconomic History  . Marital status: Divorced    Spouse name: Not on file  . Number of children: Not on file  . Years of education: Not on file  . Highest education level: Not on file  Occupational History  . Not on file  Social Needs  . Financial resource strain: Not on file  . Food insecurity    Worry: Not on file    Inability: Not on file  . Transportation needs    Medical: Not on file    Non-medical: Not on file   Tobacco Use  . Smoking status: Former Smoker    Packs/day: 2.00    Years: 35.00    Pack years: 70.00    Types: Cigarettes    Quit date: 11/19/2013    Years since quitting: 5.6  . Smokeless tobacco: Never Used  Substance and Sexual Activity  . Alcohol use: Yes    Alcohol/week: 0.0 standard drinks    Comment: social  . Drug use: No  . Sexual activity: Not on file  Lifestyle  . Physical activity    Days per week: Not on file    Minutes per session: Not on file  . Stress: Not on file  Relationships  . Social Herbalist on phone: Not on file    Gets together: Not on file    Attends religious service: Not on file    Active member of club or organization: Not on file    Attends meetings of clubs or organizations: Not on file    Relationship status: Not on file  . Intimate partner violence    Fear of current or ex partner: Not on file    Emotionally abused: Not on file    Physically abused: Not on file    Forced sexual activity: Not on file  Other Topics Concern  . Not on file  Social History Narrative   Divorced   No family   Works at [employer] more   Smokes 1.5 ppd   Sedentary   Stress in life    Family History: Family History  Problem Relation Age of Onset  . Heart failure Mother   . Hypertension Mother   . Hyperlipidemia Mother   . Heart disease Mother   . Liver disease Father   . Alcohol abuse Father   . Colon cancer Neg Hx   . Esophageal cancer Neg Hx   . Stomach cancer Neg Hx     Review of Systems: All other systems reviewed and are otherwise negative except as noted above.   Physical Exam: VS:  BP 102/62   Pulse 63   Ht 5\' 3"  (1.6 m)   Wt 150 lb 3.2 oz (68.1 kg)   SpO2 97%   BMI 26.61 kg/m  , BMI Body mass index is 26.61 kg/m. Wt Readings from Last 3 Encounters:  07/04/19 150 lb 3.2 oz (68.1 kg)  05/02/19 144 lb (65.3 kg)  04/06/19 147 lb (66.7 kg)    GEN- The patient is well appearing, alert and oriented x 3  today.   HEENT: normocephalic, atraumatic; sclera clear, conjunctiva pink; hearing intact; oropharynx clear; neck supple  Lungs- Clear to  ausculation bilaterally, normal work of breathing.  No wheezes, rales, rhonchi Heart- Regular rate and rhythm  GI- soft, non-tender, non-distended, bowel sounds present  Extremities- no clubbing, cyanosis, or edema  MS- no significant deformity or atrophy Skin- warm and dry, no rash or lesion  Psych- euthymic mood, full affect Neuro- strength and sensation are intact   EKG:  EKG is ordered today. The ekg ordered today shows sinus rhythm, rate 63, RBBB, QRS 186msec  Recent Labs: 08/25/2018: BUN 19; Creat 0.83; Potassium 4.9; Sodium 137 08/26/2018: Magnesium 2.1    Other studies Reviewed: Additional studies/ records that were reviewed today include: Dr Jackalyn Lombard office notes  Assessment and Plan:  1.  Paroxysmal atrial fibrillation She has had some increasing AF burden which is symptomatic. Reviewed again potential benefits of ILR, she would like to proceed but is changing insurance in January and would like to wait until then. Continue Xarelto for CHADS2VASC of 2 BMET, CBC checked by Dr Shelia Media Per Dr Jackalyn Lombard last note, she is a reasonable candidate for ablation, her AAD options are limited with underlying conduction system disease.  We discussed Tikosyn and ablation today. She would like to hold off for now.  Lifestyle modification reviewed. She snores but has never had a sleep study. She is willing to have home sleep study. With allergies, she is concerned about her ability to wear a mask. If positive, would refer to ENT for evaluation.    Current medicines are reviewed at length with the patient today.   The patient does not have concerns regarding her medicines.  The following changes were made today:  none  Labs/ tests ordered today include: none Orders Placed This Encounter  Procedures  . Split night study     Disposition:   Follow  up with Dr Rayann Heman in 4 months with planned ILR implant in office at that time     Signed, [name], NP 07/04/2019 10:49 AM   [facility] [address] [place] [address] [phone] (office) [phone] (fax)

## 2019-07-04 ENCOUNTER — Other Ambulatory Visit: Payer: Self-pay

## 2019-07-04 ENCOUNTER — Telehealth: Payer: Self-pay | Admitting: *Deleted

## 2019-07-04 ENCOUNTER — Encounter: Payer: Self-pay | Admitting: Nurse Practitioner

## 2019-07-04 ENCOUNTER — Ambulatory Visit (INDEPENDENT_AMBULATORY_CARE_PROVIDER_SITE_OTHER): Payer: Medicare Other | Admitting: Nurse Practitioner

## 2019-07-04 VITALS — BP 102/62 | HR 63 | Ht 63.0 in | Wt 150.2 lb

## 2019-07-04 DIAGNOSIS — R0683 Snoring: Secondary | ICD-10-CM

## 2019-07-04 DIAGNOSIS — I480 Paroxysmal atrial fibrillation: Secondary | ICD-10-CM | POA: Diagnosis not present

## 2019-07-04 NOTE — Addendum Note (Signed)
Addended by: Jeremy Johann on: 07/04/2019 11:13 AM   Modules accepted: Orders

## 2019-07-04 NOTE — Patient Instructions (Signed)
Medication Instructions:  none If you need a refill on your cardiac medications before your next appointment, please call your pharmacy.   Lab work: none If you have labs (blood work) drawn today and your tests are completely normal, you will receive your results only by: [name] MyChart Message (if you have MyChart) OR . A paper copy in the mail If you have any lab test that is abnormal or we need to change your treatment, we will call you to review the results.  Testing/Procedures: Will call to schedule Your physician has recommended that you have a sleep study. This test records several body functions during sleep, including: brain activity, eye movement, oxygen and carbon dioxide blood levels, heart rate and rhythm, breathing rate and rhythm, the flow of air through your mouth and nose, snoring, body muscle movements, and chest and belly movement.    Follow-Up: 4 months with Dr Rayann Heman in [place], you and your health needs are our priority.  As part of our continuing mission to provide you with exceptional heart care, we have created designated Provider Care Teams.  These Care Teams include your primary Cardiologist (physician) and Advanced Practice Providers (APPs -  Physician Assistants and Nurse Practitioners) who all work together to provide you with the care you need, when you need it. .   Any Other Special Instructions Will Be Listed Below (If Applicable).

## 2019-07-04 NOTE — Telephone Encounter (Signed)
-----   Message from Frederik Schmidt, RN sent at 07/04/2019 10:45 AM EDT ----- Regarding: Sleep Study Please schedule per [name], NP  Snoring   Thank you

## 2019-07-04 NOTE — Telephone Encounter (Signed)
Staff message sent to Kathryn Booker per [facility] web portal no PA is required for sleep study. Decision [id number].

## 2019-07-05 ENCOUNTER — Telehealth: Payer: Self-pay | Admitting: *Deleted

## 2019-07-05 NOTE — Telephone Encounter (Signed)
RE: precert Lauralee Evener, CMA  Freada Bergeron, CMA        Per [facility] no PA is required. Ok to schedule sleep study. Decision [id number].

## 2019-07-05 NOTE — Telephone Encounter (Signed)
Staff message sent to [name] ok to schedule sleep study. Per [facility] web portal no PA is required. Decision [id number].

## 2019-07-07 NOTE — Telephone Encounter (Addendum)
RE: Home sleep  Kathryn Berthold, NP  Freada Bergeron, CMA        yes     ----- Message -----  From: Freada Bergeron, CMA  Sent: 07/05/2019  3:58 PM EDT  To: Freada Bergeron, CMA, Amber Sena Slate, NP  Subject: Home sleep                    Patient got approved for in lab study but she wants a home sleep study IS THIS OK?   Thanks,  [name]

## 2019-07-07 NOTE — Addendum Note (Signed)
Addended by: Freada Bergeron on: 07/07/2019 11:07 AM   Modules accepted: Orders

## 2019-07-11 NOTE — Telephone Encounter (Signed)
Patient is aware and agreeable to Home Sleep Study through [facility]. Patient is scheduled for 08/29/19 at 1:15 P to pick up home sleep kit and meet with Respiratory therapist at [facility]. Patient is aware that if this appointment date and time does not work for them they should contact Artis Delay directly at [phone]. Patient is aware that a sleep packet will be sent from [facility] in week. Left detailed message on voicemail with date and time of titration and informed patient to call back to confirm or reschedule.

## 2019-07-15 ENCOUNTER — Ambulatory Visit (INDEPENDENT_AMBULATORY_CARE_PROVIDER_SITE_OTHER)
Admission: RE | Admit: 2019-07-15 | Discharge: 2019-07-15 | Disposition: A | Discharge status: Home / Self Care | Payer: Medicare Other | Source: Ambulatory Visit | Attending: Acute Care | Admitting: Acute Care

## 2019-07-15 ENCOUNTER — Other Ambulatory Visit: Payer: Self-pay

## 2019-07-15 DIAGNOSIS — Z122 Encounter for screening for malignant neoplasm of respiratory organs: Secondary | ICD-10-CM

## 2019-07-15 DIAGNOSIS — Z87891 Personal history of nicotine dependence: Secondary | ICD-10-CM | POA: Diagnosis not present

## 2019-07-16 ENCOUNTER — Other Ambulatory Visit (HOSPITAL_COMMUNITY): Payer: Medicare Other

## 2019-07-18 ENCOUNTER — Other Ambulatory Visit: Payer: Self-pay | Admitting: *Deleted

## 2019-07-18 DIAGNOSIS — Z87891 Personal history of nicotine dependence: Secondary | ICD-10-CM

## 2019-07-18 DIAGNOSIS — Z122 Encounter for screening for malignant neoplasm of respiratory organs: Secondary | ICD-10-CM

## 2019-07-19 ENCOUNTER — Encounter (HOSPITAL_BASED_OUTPATIENT_CLINIC_OR_DEPARTMENT_OTHER): Payer: Medicare Other | Admitting: Cardiology

## 2019-07-19 ENCOUNTER — Ambulatory Visit: Payer: Medicare Other | Admitting: Nurse Practitioner

## 2019-07-26 ENCOUNTER — Other Ambulatory Visit: Payer: Self-pay | Admitting: Physician Assistant

## 2019-07-29 ENCOUNTER — Other Ambulatory Visit: Payer: Self-pay | Admitting: Physician Assistant

## 2019-08-12 ENCOUNTER — Other Ambulatory Visit: Payer: Self-pay | Admitting: Adult Health

## 2019-08-29 ENCOUNTER — Other Ambulatory Visit: Payer: Self-pay

## 2019-08-29 ENCOUNTER — Ambulatory Visit (HOSPITAL_BASED_OUTPATIENT_CLINIC_OR_DEPARTMENT_OTHER): Payer: Medicare Other | Attending: Nurse Practitioner | Admitting: Cardiology

## 2019-08-29 DIAGNOSIS — I480 Paroxysmal atrial fibrillation: Secondary | ICD-10-CM

## 2019-08-29 DIAGNOSIS — R0683 Snoring: Secondary | ICD-10-CM | POA: Diagnosis not present

## 2019-08-29 DIAGNOSIS — G4733 Obstructive sleep apnea (adult) (pediatric): Secondary | ICD-10-CM | POA: Diagnosis present

## 2019-09-02 NOTE — Procedures (Signed)
   Patient Name: Kathryn Booker, Kathryn Booker Date: 08/29/2019 Gender: Female D.O.B: [birth year] Age (years): 68 Referring Provider: Sherran Needs Height (inches): 37 Interpreting Physician: Fransico Him MD, ABSM Weight (lbs): 149 RPSGT: Jacolyn Reedy BMI: 26 MRN: [id number] Neck Size: 14.50  CLINICAL INFORMATION Sleep Study Type: HST  Indication for sleep study: Snoring (786.09)  Epworth Sleepiness Score: 5  SLEEP STUDY TECHNIQUE A multi-channel overnight portable sleep study was performed. The channels recorded were: nasal airflow, thoracic respiratory movement, and oxygen saturation with a pulse oximetry. Snoring was also monitored.  MEDICATIONS Patient self administered medications include: N/A.  SLEEP ARCHITECTURE Patient was studied for 597.5 minutes. The sleep efficiency was 100.0 % and the patient was supine for 73.9%. The arousal index was 0.0 per hour.  RESPIRATORY PARAMETERS The overall AHI was 58.5 per hour, with a central apnea index of 0.0 per hour.  The oxygen nadir was 87% during sleep.  CARDIAC DATA Mean heart rate during sleep was 59.7 bpm.  IMPRESSIONS - Severe obstructive sleep apnea occurred during this study (AHI = 58.5/h). - No significant central sleep apnea occurred during this study (CAI = 0.0/h). - Mild oxygen desaturation was noted during this study (Min O2 = 87%). - Patient snored 26.4% during the sleep.  DIAGNOSIS - Obstructive Sleep Apnea (327.23 [G47.33 ICD-10])  RECOMMENDATIONS - Recommend in lab CPAP titration due to severity of OSA.  - Positional therapy avoiding supine position during sleep. - Avoid alcohol, sedatives and other CNS depressants that may worsen sleep apnea and disrupt normal sleep architecture. - Sleep hygiene should be reviewed to assess factors that may improve sleep quality. - Weight management and regular exercise should be initiated or continued.  Signed: Fransico Him, MD ABSM 09/02/2019

## 2019-09-07 ENCOUNTER — Telehealth: Payer: Self-pay | Admitting: *Deleted

## 2019-09-07 ENCOUNTER — Other Ambulatory Visit: Payer: Self-pay | Admitting: Adult Health

## 2019-09-07 DIAGNOSIS — G4733 Obstructive sleep apnea (adult) (pediatric): Secondary | ICD-10-CM

## 2019-09-07 NOTE — Telephone Encounter (Signed)
-----   Message from Sueanne Margarita, MD sent at 09/02/2019  2:48 PM EST ----- Please let patient know that they have sleep apnea and recommend CPAP titration. Please set up titration in the sleep lab.

## 2019-09-07 NOTE — Telephone Encounter (Signed)
Informed patient of sleep study results and patient understanding was verbalized. Patient understands her sleep study showed they have sleep apnea and recommend CPAP titration. Please set up titration in the sleep lab.  Patient is aware and agreeable to cpap titration through [facility].  titration sent to sleep pool

## 2019-09-16 ENCOUNTER — Telehealth: Payer: Self-pay | Admitting: *Deleted

## 2019-09-16 NOTE — Telephone Encounter (Signed)
Staff message sent to [facility] per [facility] no PA is required. Ok to schedule. Decision [id number].

## 2019-09-22 ENCOUNTER — Telehealth: Payer: Self-pay | Admitting: Cardiology

## 2019-09-22 NOTE — Telephone Encounter (Signed)
Patient is scheduled for CPAP Titration on 10/11/19. Pt  is scheduled for COVID screening on 10/08/19 11:35 prior to titration.   Patient understands her titration study will be done at [facility] sleep lab. Patient understands she will receive a letter in a week or so detailing appointment, date, time, and location. Patient understands to call if she does not receive the letter  in a timely manner. Patient agrees with treatment and thanked me for call.

## 2019-09-22 NOTE — Telephone Encounter (Signed)
New message   Pt is calling stating she is schedule for a sleep study 10/11/19 but her insurance company is changing on come January. Her insurance now has approved it but she needs to make sure her new insurance company is going to approve this test.   [place] Member [phone]

## 2019-09-22 NOTE — Telephone Encounter (Signed)
Message forwarded to Barry Brunner for pre-authorization.

## 2019-09-22 NOTE — Addendum Note (Signed)
Addended by: Freada Bergeron on: 09/22/2019 10:07 AM   Modules accepted: Orders

## 2019-09-24 ENCOUNTER — Other Ambulatory Visit: Payer: Self-pay | Admitting: Adult Health

## 2019-09-28 ENCOUNTER — Telehealth: Payer: Self-pay | Admitting: *Deleted

## 2019-09-28 NOTE — Telephone Encounter (Signed)
Staff message sent to [name] that the patient's sleep study may need to be rescheduled. If [employer] requires her to get a PA it will not be completed by the 5th. I am out of the office until the 4th, and the precert will not be received from the insurance by the 5th.

## 2019-09-28 NOTE — Telephone Encounter (Signed)
-----   Message from Freada Bergeron, [place] sent at 09/22/2019  4:16 PM EST ----- Regarding: NEW INSURANCE Pt is calling stating she is schedule for a sleep study 10/11/19 but her insurance company is changing on come January. Her insurance now has approved it but she needs to make sure her new insurance company is going to approve this test.   [place] Member [id number]   Patient wants to know her patient portion what number to call and the code to give.

## 2019-10-04 ENCOUNTER — Telehealth: Payer: Self-pay | Admitting: Cardiology

## 2019-10-04 NOTE — Telephone Encounter (Signed)
Spoke to patient and informed her that her sleep study was moved out a week to give her new insurance time to be precerted. Patient was grateful for the call.

## 2019-10-04 NOTE — Telephone Encounter (Signed)
New message   Patient is calling to follow up about her insurance covering the sleep study and what she will have to pay of it.

## 2019-10-06 NOTE — Telephone Encounter (Signed)
Patient is RE-scheduled for CPAP Titration on 10/25/19. Pt  is scheduled for COVID screening on 10/22/19 11:30 prior to titration.   Patient understands her titration study will be done at [facility] sleep lab. Patient understands she will receive a letter in a week or so detailing appointment, date, time, and location. Patient understands to call if she does not receive the letter  in a timely manner. Patient agrees with treatment and thanked me for call.

## 2019-10-06 NOTE — Telephone Encounter (Signed)
RE: NEW INSURANCE Lauralee Evener, [place]  Freada Bergeron, CMA  She may need to be rescheduled. I will not return before January 4th and will not have PA back in one day if it needs a PA. Please inform the patient and reschedule if needed.

## 2019-10-08 ENCOUNTER — Other Ambulatory Visit (HOSPITAL_COMMUNITY): Payer: Medicare Other

## 2019-10-11 ENCOUNTER — Encounter (HOSPITAL_BASED_OUTPATIENT_CLINIC_OR_DEPARTMENT_OTHER): Payer: Medicare Other | Admitting: Cardiology

## 2019-10-17 ENCOUNTER — Telehealth: Payer: Self-pay | Admitting: *Deleted

## 2019-10-17 NOTE — Telephone Encounter (Signed)
-----   Message from Lauralee Evener, [place] sent at 10/11/2019  2:43 PM EST ----- Regarding: RE: Combine received. Josem Kaufmann [id number] Valid 10/25/19 TO 11/24/19. ----- Message ----- From: Freada Bergeron, CMA Sent: 10/06/2019   2:52 PM EST To: Lauralee Evener, CMA Subject: NEW INSURANCE                                  Patient is RE-scheduled for CPAP Titration on1/19/21. Ptis scheduled for COVID screening on 10/22/19 11:30prior to titration.

## 2019-10-17 NOTE — Telephone Encounter (Signed)
Patient is scheduled for lab study on 10/25/19. Pt is scheduled for COVID screening on 10/22/19 11:30 prior to titration.   Patient understands her sleep study will be done at [facility] sleep lab. Patient understands she will receive a sleep packet in a week or so. Patient understands to call if she does not receive the sleep packet in a timely manner.  Left detailed message on voicemail with date and time of titration and informed patient to call back to confirm or reschedule.

## 2019-10-22 ENCOUNTER — Other Ambulatory Visit (HOSPITAL_COMMUNITY)
Admission: RE | Admit: 2019-10-22 | Discharge: 2019-10-22 | Disposition: A | Discharge status: Home / Self Care | Payer: Medicare PPO | Source: Ambulatory Visit | Attending: Cardiology | Admitting: Cardiology

## 2019-10-22 DIAGNOSIS — Z20822 Contact with and (suspected) exposure to covid-19: Secondary | ICD-10-CM | POA: Insufficient documentation

## 2019-10-22 DIAGNOSIS — Z01812 Encounter for preprocedural laboratory examination: Secondary | ICD-10-CM | POA: Insufficient documentation

## 2019-10-22 LAB — SARS CORONAVIRUS 2 (TAT 6-24 HRS): SARS Coronavirus 2: NEGATIVE

## 2019-10-25 ENCOUNTER — Other Ambulatory Visit: Payer: Self-pay

## 2019-10-25 ENCOUNTER — Ambulatory Visit (HOSPITAL_BASED_OUTPATIENT_CLINIC_OR_DEPARTMENT_OTHER): Payer: Medicare PPO | Attending: Cardiology | Admitting: Cardiology

## 2019-10-25 DIAGNOSIS — I493 Ventricular premature depolarization: Secondary | ICD-10-CM | POA: Insufficient documentation

## 2019-10-25 DIAGNOSIS — G4733 Obstructive sleep apnea (adult) (pediatric): Secondary | ICD-10-CM | POA: Diagnosis not present

## 2019-10-26 ENCOUNTER — Other Ambulatory Visit (HOSPITAL_BASED_OUTPATIENT_CLINIC_OR_DEPARTMENT_OTHER): Payer: Self-pay

## 2019-10-26 DIAGNOSIS — G4733 Obstructive sleep apnea (adult) (pediatric): Secondary | ICD-10-CM

## 2019-10-26 NOTE — Procedures (Signed)
    Patient Name: Kathryn Booker, [place] Date: 10/25/2019 Gender: Female D.O.B: [birth year] Age (years): 83 Referring Provider: Fransico Him MD, ABSM Height (inches): 63 Interpreting Physician: Fransico Him MD, ABSM Weight (lbs): 149 RPSGT: Laren Everts BMI: 26 MRN: [id number] Neck Size: 14.50  CLINICAL INFORMATION The patient is referred for a CPAP titration to treat sleep apnea.  SLEEP STUDY TECHNIQUE As per the AASM Manual for the Scoring of Sleep and Associated Events v2.3 (April 2016) with a hypopnea requiring 4% desaturations.  The channels recorded and monitored were frontal, central and occipital EEG, electrooculogram (EOG), submentalis EMG (chin), nasal and oral airflow, thoracic and abdominal wall motion, anterior tibialis EMG, snore microphone, electrocardiogram, and pulse oximetry. Continuous positive airway pressure (CPAP) was initiated at the beginning of the study and titrated to treat sleep-disordered breathing.  MEDICATIONS Medications self-administered by patient taken the night of the study : N/A  TECHNICIAN COMMENTS Comments added by technician: Patient had difficulty initiating sleep. Comments added by scorer: N/A  RESPIRATORY PARAMETERS Optimal PAP Pressure (cm): 12  AHI at Optimal Pressure (/hr):0.0 Overall Minimal O2 (%):87.0  Supine % at Optimal Pressure (%):100 Minimal O2 at Optimal Pressure (%): 92.0   SLEEP ARCHITECTURE The study was initiated at 10:46:51 PM and ended at 5:00:30 AM.  minutes and the sleep efficiency was 58.1%. The total sleep time was 217 minutes.  The patient spent 20.0% of the night in stage N1 sleep, 80.0%% in stage N2 sleep, 0.0% in stage N3 and 0% in REM.Stage REM latency was N/A minutes  Wake after sleep onset was 123.4. Alpha intrusion was absent. Supine sleep was 84.10%.    The 2 lead EKG demonstrated sinus rhythm. The mean heart rate was 64.2 beats per minute. Other EKG findings include: Frequent PVCs.  LEG  MOVEMENT DATA The total Periodic Limb Movements of Sleep (PLMS) were 0. The PLMS index was 0.0. A PLMS index of <15 is considered normal in adults.  IMPRESSIONS - The optimal PAP pressure was 12 cm of water. - Central sleep apnea was not noted during this titration (CAI = 2.8/h). - Mild oxygen desaturations were observed during this titration (min O2 = 87.0%). - No snoring was audible during this study. - Frequent PVCs were observed during this study. - Clinically significant periodic limb movements were not noted during this study. Arousals associated with PLMs were rare.  DIAGNOSIS - Obstructive Sleep Apnea (327.23 [G47.33 ICD-10]) - PVCs  RECOMMENDATIONS - Trial of CPAP therapy on 12 cm H2O with a Small size Fisher&Paykel Full Face Mask F&P Vitera (new) mask and heated humidification. - Avoid alcohol, sedatives and other CNS depressants that may worsen sleep apnea and disrupt normal sleep architecture. - Sleep hygiene should be reviewed to assess factors that may improve sleep quality. - Weight management and regular exercise should be initiated or continued. - Return to Sleep Center for re-evaluation after 10 weeks of therapy  [Electronically signed] 10/26/2019 09:40 PM  Fransico Him MD, ABSM Diplomate, American Board of Sleep Medicine

## 2019-10-27 ENCOUNTER — Telehealth: Payer: Self-pay

## 2019-10-27 NOTE — Telephone Encounter (Signed)
I spoke to the patient in regards to sleep study results.  She would like to meet with Dr Radford Pax to discuss results and options.  I told her that we would reach out to schedule.

## 2019-10-27 NOTE — Telephone Encounter (Signed)
lpmtcb regarding sleep study results 1/21.

## 2019-10-27 NOTE — Telephone Encounter (Signed)
-----   Message from Patsey Berthold, NP sent at 10/27/2019  9:17 AM EST ----- Please call patient and let her know that sleep study was positive for sleep apnea. She was worried about CPAP with allergies. Options are to let her see Dr Radford Pax first to see if there are options she could tolerate or refer to ENT for evaluation Sonia Baller knows the name of ENT that Dr Rayann Heman refers his patients to).  Thanks! Amber ----- Message ----- From: Sherran Needs, NP Sent: 10/27/2019   8:42 AM EST To: Patsey Berthold, NP  I think you ordered this? I haven't seen since 2018. Butch Penny  ----- Message ----- From: Sueanne Margarita, MD Sent: 10/26/2019   9:44 PM EST To: Thompson Grayer, MD, Sherran Needs, NP

## 2019-10-27 NOTE — Telephone Encounter (Signed)
lpmtcb regarding sleep results. 1/21

## 2019-10-28 ENCOUNTER — Telehealth: Payer: Self-pay | Admitting: *Deleted

## 2019-10-28 NOTE — Telephone Encounter (Signed)
-----   Message from Sueanne Margarita, MD sent at 10/26/2019  9:43 PM EST ----- Please let patient know that they had a successful PAP titration and let DME know that orders are in EPIC.  Please set up 10 week OV with me.

## 2019-10-28 NOTE — Telephone Encounter (Signed)
Informed patient of sleep study results and patient understanding was verbalized. Patient understands her sleep study showed they had a successful PAP titration and let DME know that orders are in EPIC. Please set up 10 week OV with me.   Upon patient request DME selection is [facility]. Patient understands she will be contacted by [facility] to set up her cpap. Patient understands to call if [facility] does not contact her with new setup in a timely manner. Patient understands they will be called once confirmation has been received from [facility] that they have received their new machine to schedule 10 week follow up appointment.  [facility] notified of new cpap order  Please add to airview Patient was grateful for the call and thanked me.

## 2019-10-31 NOTE — Telephone Encounter (Signed)
Scheduled patient for 11/14/19 with JA for ILR.

## 2019-10-31 NOTE — Telephone Encounter (Signed)
Patient called back to say she would like to talk to Amber before moving forward with getting her cpap unit. I have put a hold on her order.

## 2019-11-03 NOTE — Telephone Encounter (Signed)
Spoke with patient at length.  Plan for now, see Dr Rayann Heman as scheduled and move forward with ILR. Track AF burden for 1 month then start using CPAP to see if there is a difference in AF burden.  [name] - can you please help Kathryn Booker get set up with CPAP mid-March?  Thanks!  [name], NP 11/03/2019 9:11 AM

## 2019-11-04 DIAGNOSIS — J310 Chronic rhinitis: Secondary | ICD-10-CM | POA: Diagnosis not present

## 2019-11-04 DIAGNOSIS — M26622 Arthralgia of left temporomandibular joint: Secondary | ICD-10-CM | POA: Diagnosis not present

## 2019-11-04 DIAGNOSIS — H9209 Otalgia, unspecified ear: Secondary | ICD-10-CM | POA: Diagnosis not present

## 2019-11-04 DIAGNOSIS — J342 Deviated nasal septum: Secondary | ICD-10-CM | POA: Diagnosis not present

## 2019-11-04 NOTE — Telephone Encounter (Signed)
I have informed the dme she will have to hold her order until we contact her in mid March to set the patient up.

## 2019-11-14 ENCOUNTER — Other Ambulatory Visit: Payer: Self-pay

## 2019-11-14 ENCOUNTER — Ambulatory Visit: Payer: Medicare PPO | Admitting: Internal Medicine

## 2019-11-14 ENCOUNTER — Encounter: Payer: Self-pay | Admitting: Internal Medicine

## 2019-11-14 VITALS — BP 162/84 | HR 59 | Ht 63.0 in | Wt 149.0 lb

## 2019-11-14 DIAGNOSIS — G4733 Obstructive sleep apnea (adult) (pediatric): Secondary | ICD-10-CM

## 2019-11-14 DIAGNOSIS — I480 Paroxysmal atrial fibrillation: Secondary | ICD-10-CM | POA: Diagnosis not present

## 2019-11-14 DIAGNOSIS — R002 Palpitations: Secondary | ICD-10-CM

## 2019-11-14 DIAGNOSIS — E7801 Familial hypercholesterolemia: Secondary | ICD-10-CM

## 2019-11-14 DIAGNOSIS — I2510 Atherosclerotic heart disease of native coronary artery without angina pectoris: Secondary | ICD-10-CM

## 2019-11-14 DIAGNOSIS — I10 Essential (primary) hypertension: Secondary | ICD-10-CM

## 2019-11-14 DIAGNOSIS — I2584 Coronary atherosclerosis due to calcified coronary lesion: Secondary | ICD-10-CM | POA: Diagnosis not present

## 2019-11-14 HISTORY — PX: OTHER SURGICAL HISTORY: SHX169

## 2019-11-14 NOTE — Patient Instructions (Addendum)
Medication Instructions:  Your physician recommends that you continue on your current medications as directed. Please refer to the Current Medication list given to you today.  Labwork: None ordered.  Testing/Procedures: None ordered.  Follow-Up:  Your physician wants you to follow-up in: 1 month with [name], NP.  December 23, 2019 at 9:40 am at the [facility] office      Any Other Special Instructions Will Be Listed Below (If Applicable).  If you need a refill on your cardiac medications before your next appointment, please call your pharmacy.    Implantable Loop Recorder Placement, Care After This sheet gives you information about how to care for yourself after your procedure. Your health care provider may also give you more specific instructions. If you have problems or questions, contact your health care provider. What can I expect after the procedure? After the procedure, it is common to have:  Soreness or discomfort near the incision.  Some swelling or bruising near the incision. Follow these instructions at home: Incision care   Follow instructions from your health care provider about how to take care of your incision. Make sure you: ? Leave your outer dressing on for 24 hours.  After 24 hours you can remove that and shower. ? Leave adhesive strips in place. These skin closures may need to stay in place for 2 weeks or longer. If adhesive strip edges start to loosen and curl up, you may trim the loose edges. Do not remove adhesive strips completely unless your health care provider tells you to do that.  Check your incision area every day for signs of infection. Check for: ? Redness, swelling, or pain. ? Fluid or blood. ? Warmth. ? Pus or a bad smell. Do not take baths, swim, or use a hot tub until your incision is completely healed.  Activity  Return to your normal activities.  General instructions  Follow instructions from your health care provider about how  to manage your implantable loop recorder and transmit the information. Learn how to activate a recording if this is necessary for your type of device.  Do not go through a metal detection gate, and do not let someone hold a metal detector over your chest. Show your ID card.  Do not have an MRI unless you check with your health care provider first.  Take over-the-counter and prescription medicines only as told by your health care provider.  Keep all follow-up visits as told by your health care provider. This is important. Contact a health care provider if:  You have redness, swelling, or pain around your incision.  You have a fever.  You have pain that is not relieved by your pain medicine.  You have triggered your device because of fainting (syncope) or because of a heartbeat that feels like it is racing, slow, fluttering, or skipping (palpitations). Get help right away if you have:  Chest pain.  Difficulty breathing. Summary  After the procedure, it is common to have soreness or discomfort near the incision.  Change your dressing as told by your health care provider.  Follow instructions from your health care provider about how to manage your implantable loop recorder and transmit the information.  Keep all follow-up visits as told by your health care provider. This is important. This information is not intended to replace advice given to you by your health care provider. Make sure you discuss any questions you have with your health care provider. Document Released: 09/03/2015 Document Revised: 11/07/2017 Document  Reviewed: 11/07/2017 Elsevier Patient Education  [employer].

## 2019-11-14 NOTE — Progress Notes (Signed)
PCP: Deland Pretty, MD   Primary EP: Dr Malva Cogan Merisa Kathryn Booker is a 69 y.o. female who presents today for routine electrophysiology followup.  Since last being seen in our clinic, the patient reports doing very well.  She has occasional palpitations. Today, she denies symptoms of chest pain, shortness of breath,  lower extremity edema, dizziness, presyncope, or syncope.  The patient is otherwise without complaint today.   Past Medical History:  Diagnosis Date  . Allergies   . Anxiety   . Asthma   . COPD (chronic obstructive pulmonary disease) ([place])   . Coronary artery calcification seen on CT scan   . Depression   . Fatigue   . Former tobacco use   . Hyperlipidemia   . Paroxysmal atrial fibrillation (HCC)   . Pre-diabetes   . RBBB (right bundle branch block with left anterior fascicular block)   . Scoliosis   . Serum calcium elevated   . Thyroid disease    "questionable" per patient   Past Surgical History:  Procedure Laterality Date  . APPENDECTOMY    . BREAST EXCISIONAL BIOPSY Right 2017   benign  . LAPROSCOPIC     X 2 IN EARLY 30-40'S  . OVARIAN CYST REMOVAL    . RADIOACTIVE SEED GUIDED EXCISIONAL BREAST BIOPSY Right 05/15/2016   Procedure: RIGHT RADIOACTIVE SEED GUIDED EXCISIONAL BREAST BIOPSY;  Surgeon: Rolm Bookbinder, MD;  Location: [facility];  Service: General;  Laterality: Right;  RIGHT RADIOACTIVE SEED GUIDED EXCISIONAL BREAST BIOPSY  . TONSILECTOMY, ADENOIDECTOMY, BILATERAL MYRINGOTOMY AND TUBES      ROS- all systems are reviewed and negatives except as per HPI above  Current Outpatient Medications  Medication Sig Dispense Refill  . albuterol (PROVENTIL HFA;VENTOLIN HFA) 108 (90 Base) MCG/ACT inhaler Use 2 inhalations 15 minutes apart every 4 hours to rescue Asthma 3 Inhaler 3  . azelastine (ASTELIN) 0.1 % nasal spray SPRAY 2 SPRAYS INTO EACH NOSTRIL TWICE A DAY AS DIRECTED 90 mL 3  . buPROPion (WELLBUTRIN XL) 300 MG 24 hr tablet Take 1  tablet every Morning or Mood, Focus & Concentration 90 tablet 1  . cetirizine (ZYRTEC) 10 MG tablet Take 10 mg by mouth daily.    . citalopram (CELEXA) 40 MG tablet Take 1 tablet Daily for Mood 90 tablet 1  . diltiazem (CARDIZEM CD) 120 MG 24 hr capsule Take 1 capsule (120 mg total) by mouth daily. 90 capsule 2  . diltiazem (CARDIZEM) 30 MG tablet TAKE 1 TABLET BY MOUTH AS NEEDED (FAST PALPITATIONS) 90 tablet 1  . ezetimibe (ZETIA) 10 MG tablet TAKE 1 TABLET BY MOUTH EVERY DAY 90 tablet 1  . fluticasone (FLONASE) 50 MCG/ACT nasal spray PLACE 1 SPRAY INTO EACH NOSTRIL ONCE DAILY 48 g 3  . ipratropium (ATROVENT) 0.06 % nasal spray Place 2 sprays into both nostrils 2 (two) times daily.    . magnesium oxide (MAG-OX) 400 MG tablet Take 400 mg by mouth daily.    . montelukast (SINGULAIR) 10 MG tablet TAKE 1 TABLET BY MOUTH EVERYDAY AT BEDTIME 90 tablet 1  . OVER THE COUNTER MEDICATION Eye drop 1 drop each eye daily.    . rivaroxaban (XARELTO) 20 MG TABS tablet Take 1 tablet Daily to Prevent Blood Clots 90 tablet 1  . valACYclovir (VALTREX) 500 MG tablet Take 1 tablet Daily for Prevention Fever Blisters 90 tablet 3  . WIXELA INHUB 250-50 MCG/DOSE AEPB INHALE 1 PUFF INTO THE LUNGS TWICE A DAY**RINSE MOUTH AFTER INHALATION  180 each 1   No current facility-administered medications for this visit.    Physical Exam: Vitals:   11/14/19 1052  BP: (!) 162/84  Pulse: (!) 59  SpO2: 97%  Weight: 149 lb (67.6 kg)  Height: 5\' 3"  (1.6 m)    GEN- The patient is well appearing, alert and oriented x 3 today.   Head- normocephalic, atraumatic Eyes-  Sclera clear, conjunctiva pink Ears- hearing intact Oropharynx- clear Lungs- Clear to ausculation bilaterally, normal work of breathing Heart- Regular rate and rhythm, no murmurs, rubs or gallops, PMI not laterally displaced GI- soft, NT, ND, + BS Extremities- no clubbing, cyanosis, or edema  Wt Readings from Last 3 Encounters:  11/14/19 149 lb (67.6 kg)   10/25/19 149 lb (67.6 kg)  08/29/19 149 lb (67.6 kg)    EKG tracing ordered today is personally reviewed and shows sinus with PACs, RBBB, LAHB Prior chest CT showing atherosclerosis discussed at length,  Prior echo (2017) discussed at length.  Prior lipids discussed at length.  Assessment and Plan:  1. Paroxysmal atrial fibrillation She has a h/o afib.  She also has palpitations of unclear significance. I have advised ILR for further evaluation of her palpitations and also for afib management. Her chads2vasc score is 3.  She is on xarelto.  She has failed medical therapy with multaq.  Continue diltiazem  I would therefore advise implantation of an implantable loop recorder for long term arrhythmia monitoring, afib management, and further evaluation of her palpitations.  She has PACs with aberrance and PVCs which may be the source for her symptoms.  I would therefore advise [id number] with PVC detections.  Risks and benefits to ILR were discussed at length with the patient today, including but not limited to risks of bleeding and infection.  Extensive device education was performed.  Remote monitoring was also discussed at length today.  The patient understands and wishes to proceed.  We will proceed at this time with ILR implantation.  2. Sleep apnea We discussed at length today Compliance with therapy is encouraged She wishes to further evaluate her afib burden and then make a decision about anticoagulation thereafter.  3. HTN Elevated today She does not wish to make medicine changes. She feels that her BP is typically controlled though she does not check it regularly.  4. HL She has longstanding elevation in lipids as well as atherosclerotic changes I discussed statin today.  She did not previously tolerate a statin and is on zetia.  I would like to retry statin though she is not excited about this.  We should consider follow-up in the lipid clinic on return  5. CAD/ atherosclerosis We  discussed at  Length today with multiple questions answered We discussed stress testing for further risk stratification.  She has no ischemic symptoms currently.  We also discussed lipid management as above. As she is on xarelto, I would not advise ASA at this time.  Return in 4 weeks for follow-up on LINQ findings, OSA, HL, HTN, and atherosclerosis with EP NP Ultimately, general cardiology follow-up may also be beneficial  Thompson Grayer MD, [facility] 11/14/2019 10:52 AM      DESCRIPTION OF PROCEDURE:  Informed written consent was obtained.  The patient required no sedation for the procedure today.  The patients left chest was prepped and draped. Mapping over the patient's chest was performed to identify the appropriate ILR site.  This area was found to be the left inframammary region.  The skin overlying this region  was infiltrated with lidocaine for local analgesia.  A 0.5-cm incision was made at the implant site.  A subcutaneous ILR pocket was fashioned using a combination of sharp and blunt dissection.  A Medtronic Reveal Linq model LNQ 2 (SN [id number]) implantable loop recorder was then placed into the pocket R waves were very prominent and measured > 0.2 mV. EBL<1 ml.  Steri- Strips and a sterile dressing were then applied.  There were no early apparent complications.     CONCLUSIONS:   1. Successful implantation of a Medtronic Reveal LINQ implantable loop recorder for evaluation of palpitations, management of afib, and further characterization of her PVCs/ PACs  2. No early apparent complications.   Thompson Grayer MD, [facility] 11/14/2019 10:52 AM

## 2019-11-21 ENCOUNTER — Telehealth: Payer: Self-pay

## 2019-11-21 NOTE — Telephone Encounter (Signed)
LINQ alert received- AF alert 3 hours w/ RVR 150's. Burden 4.9%. Known AF on 120mg  Cardizem daily with PRN dose of 30mg , Mag Ox 400mg  daily and Xarelto. Linq implant placed for AF management and burden.    Spoke with pt, she reports she was sleeping at time of episode.  She denies any cardiac symptoms.  Pt confirmed current medications and has not taken PRN dose of Diltiazem.    Forwarding to MD for further review.

## 2019-11-24 NOTE — Telephone Encounter (Signed)
Increase diltiazem CD to 240mg  daily

## 2019-11-25 MED ORDER — DILTIAZEM HCL ER COATED BEADS 240 MG PO CP24: 240.0000 mg | ORAL_CAPSULE | Freq: Every day | ORAL | 1 refills | Status: DC

## 2019-11-25 NOTE — Telephone Encounter (Signed)
Attempted to reach pt.  No answer, left message requesting she callback, provided phone # for Device clinic to return call.

## 2019-11-25 NOTE — Telephone Encounter (Signed)
Pt returned phone call.  Advised of MD recommendation to increase Diltiazem.  Pt v/u and agreeable to change.  Updated Rx sent to pharmacy, pt to take two 120mg  capsules daily in meantime.

## 2019-11-25 NOTE — Addendum Note (Signed)
Addended by: Trena Platt E on: 11/25/2019 04:53 PM   Modules accepted: Orders

## 2019-11-27 ENCOUNTER — Ambulatory Visit: Payer: Medicare PPO | Attending: Internal Medicine

## 2019-11-27 DIAGNOSIS — Z23 Encounter for immunization: Secondary | ICD-10-CM | POA: Insufficient documentation

## 2019-11-27 NOTE — Progress Notes (Signed)
   Covid-19 Vaccination Clinic  Name:  Kathryn Booker    MRN: [id number] DOB: September 18, 1951  11/27/2019  [name] was observed post Covid-19 immunization for 15 minutes without incidence. She was provided with Vaccine Information Sheet and instruction to access the V-Safe system.   [name] was instructed to call 911 with any severe reactions post vaccine: [name] Difficulty breathing  . Swelling of your face and throat  . A fast heartbeat  . A bad rash all over your body  . Dizziness and weakness    Immunizations Administered    Name Date Dose VIS Date Route   Pfizer COVID-19 Vaccine 11/27/2019  3:15 PM 0.3 mL 09/16/2019 Intramuscular   Manufacturer: [place]   Lot: [id number]   [place]: [id number]

## 2019-11-28 ENCOUNTER — Telehealth: Payer: Self-pay

## 2019-11-28 NOTE — Telephone Encounter (Signed)
Spoke to patient

## 2019-11-28 NOTE — Telephone Encounter (Signed)
Spoke with patient. She was aware of AF episode on 2/19.  Average V rate 154bpm, max 176bpm, duration 1 hour. She has been taking increased dose of Diltiazem. Continue for now. Will continue to monitor trend. Keep appt with me next month.  [name], NP 11/28/2019 2:40 PM

## 2019-11-28 NOTE — Telephone Encounter (Signed)
I spoke to the patient and discussed upcoming March appointments.  She verbalized understanding.

## 2019-11-28 NOTE — Telephone Encounter (Signed)
The pt states on 11-25-2019 she felt palpitations and she had a fast Heart rate of 118 bpm. She states the episode happened around 8:55 pm. I told her I will have the nurse give her a call back to discuss her symptoms. The best phone number for the pt is [phone].

## 2019-12-17 LAB — CUP PACEART REMOTE DEVICE CHECK
Date Time Interrogation Session: 20210313075342
Implantable Pulse Generator Implant Date: 20210208

## 2019-12-19 ENCOUNTER — Ambulatory Visit (INDEPENDENT_AMBULATORY_CARE_PROVIDER_SITE_OTHER): Payer: Medicare PPO | Admitting: *Deleted

## 2019-12-19 DIAGNOSIS — I480 Paroxysmal atrial fibrillation: Secondary | ICD-10-CM

## 2019-12-19 NOTE — Progress Notes (Signed)
ILR Remote 

## 2019-12-21 ENCOUNTER — Ambulatory Visit: Payer: Medicare PPO | Attending: Internal Medicine

## 2019-12-21 DIAGNOSIS — Z23 Encounter for immunization: Secondary | ICD-10-CM

## 2019-12-21 NOTE — Progress Notes (Signed)
   Covid-19 Vaccination Clinic  Name:  Glendoria Servais    MRN: ID:[id number] DOB: 1951-05-16  12/21/2019  Ms. Bazen was observed post Covid-19 immunization for 15 minutes without incident. She was provided with Vaccine Information Sheet and instruction to access the V-Safe system.   Ms. Grech was instructed to call 911 with any severe reactions post vaccine: [name] Difficulty breathing  . Swelling of face and throat  . A fast heartbeat  . A bad rash all over body  . Dizziness and weakness   Immunizations Administered    Name Date Dose VIS Date Route   Pfizer COVID-19 Vaccine 12/21/2019 10:05 AM 0.3 mL 09/16/2019 Intramuscular   Manufacturer: [place]   Lot: [id number]   [place]: [id number]

## 2019-12-21 NOTE — Progress Notes (Signed)
Electrophysiology Office Note Date: 12/23/2019  ID:  Kathryn Booker 1951/04/04, MRN [id number]  PCP: Deland Pretty, MD Electrophysiologist: Rayann Heman  CC: AF follow up  Kathryn Booker is a 69 y.o. female seen today for Dr Rayann Heman.  She presents today for routine electrophysiology followup.  Since last being seen in our clinic, the patient reports doing relatively well.  She has ongoing episodes of AF with burden of 3.9% by ILR interrogation. Symptom episodes correlate with AF.  She has not yet gotten CPAP.  She denies chest pain, PND, orthopnea, nausea, vomiting, dizziness, syncope, edema, weight gain, or early satiety.  Past Medical History:  Diagnosis Date  . Allergies   . Anxiety   . Asthma   . COPD (chronic obstructive pulmonary disease) ([place])   . Coronary artery calcification seen on CT scan   . Depression   . Fatigue   . Former tobacco use   . Hyperlipidemia   . Paroxysmal atrial fibrillation (HCC)   . Pre-diabetes   . RBBB (right bundle branch block with left anterior fascicular block)   . Scoliosis   . Serum calcium elevated   . Thyroid disease    "questionable" per patient   Past Surgical History:  Procedure Laterality Date  . APPENDECTOMY    . BREAST EXCISIONAL BIOPSY Right 2017   benign  . implantable loop recorder placement  11/14/2019   Medtronic Reveal Linq model LNQ 2 (SN [id number]) implantable loop recorder   . LAPROSCOPIC     X 2 IN EARLY 30-40'S  . OVARIAN CYST REMOVAL    . RADIOACTIVE SEED GUIDED EXCISIONAL BREAST BIOPSY Right 05/15/2016   Procedure: RIGHT RADIOACTIVE SEED GUIDED EXCISIONAL BREAST BIOPSY;  Surgeon: Rolm Bookbinder, MD;  Location: [name];  Service: General;  Laterality: Right;  RIGHT RADIOACTIVE SEED GUIDED EXCISIONAL BREAST BIOPSY  . TONSILECTOMY, ADENOIDECTOMY, BILATERAL MYRINGOTOMY AND TUBES      Current Outpatient Medications  Medication Sig Dispense Refill  . albuterol (PROVENTIL HFA;VENTOLIN  HFA) 108 (90 Base) MCG/ACT inhaler Use 2 inhalations 15 minutes apart every 4 hours to rescue Asthma 3 Inhaler 3  . azelastine (ASTELIN) 0.1 % nasal spray SPRAY 2 SPRAYS INTO EACH NOSTRIL TWICE A DAY AS DIRECTED 90 mL 3  . buPROPion (WELLBUTRIN XL) 300 MG 24 hr tablet Take 1 tablet every Morning or Mood, Focus & Concentration 90 tablet 1  . cetirizine (ZYRTEC) 10 MG tablet Take 10 mg by mouth daily.    . cholecalciferol (VITAMIN D3) 25 MCG (1000 UNIT) tablet Take 1,000 Units by mouth daily.    . citalopram (CELEXA) 40 MG tablet Take 1 tablet Daily for Mood 90 tablet 1  . diltiazem (CARDIZEM CD) 240 MG 24 hr capsule Take 1 capsule (240 mg total) by mouth daily. 90 capsule 1  . diltiazem (CARDIZEM) 30 MG tablet TAKE 1 TABLET BY MOUTH AS NEEDED (FAST PALPITATIONS) 90 tablet 1  . ezetimibe (ZETIA) 10 MG tablet TAKE 1 TABLET BY MOUTH EVERY DAY 90 tablet 1  . fluticasone (FLONASE) 50 MCG/ACT nasal spray PLACE 1 SPRAY INTO EACH NOSTRIL ONCE DAILY 48 g 3  . ipratropium (ATROVENT) 0.06 % nasal spray Place 2 sprays into both nostrils 2 (two) times daily.    . magnesium oxide (MAG-OX) 400 MG tablet Take 400 mg by mouth daily.    . montelukast (SINGULAIR) 10 MG tablet TAKE 1 TABLET BY MOUTH EVERYDAY AT BEDTIME 90 tablet 1  . OVER THE COUNTER MEDICATION Eye drop  1 drop each eye daily.    . rivaroxaban (XARELTO) 20 MG TABS tablet Take 1 tablet Daily to Prevent Blood Clots 90 tablet 1  . valACYclovir (VALTREX) 500 MG tablet Take 1 tablet Daily for Prevention Fever Blisters 90 tablet 3  . WIXELA INHUB 250-50 MCG/DOSE AEPB INHALE 1 PUFF INTO THE LUNGS TWICE A DAY**RINSE MOUTH AFTER INHALATION 180 each 1   No current facility-administered medications for this visit.    Allergies:   Lipitor [atorvastatin] and Tetanus toxoids   Social History: Social History   Socioeconomic History  . Marital status: Divorced    Spouse name: Not on file  . Number of children: Not on file  . Years of education: Not on file   . Highest education level: Not on file  Occupational History  . Not on file  Tobacco Use  . Smoking status: Former Smoker    Packs/day: 2.00    Years: 35.00    Pack years: 70.00    Types: Cigarettes    Quit date: 11/19/2013    Years since quitting: 6.0  . Smokeless tobacco: Never Used  Substance and Sexual Activity  . Alcohol use: Yes    Alcohol/week: 0.0 standard drinks    Comment: social  . Drug use: No  . Sexual activity: Not on file  Other Topics Concern  . Not on file  Social History Narrative   Divorced   No family   Works at [employer] more   Smokes 1.5 ppd   Sedentary   Stress in life   Social Determinants of Health   Financial Resource Strain:   . Difficulty of Paying Living Expenses:   Food Insecurity:   . Worried About Charity fundraiser in the Last Year:   . Arboriculturist in the Last Year:   Transportation Needs:   . Film/video editor (Medical):   [name] Lack of Transportation (Non-Medical):   Physical Activity:   . Days of Exercise per Week:   . Minutes of Exercise per Session:   Stress:   . Feeling of Stress :   Social Connections:   . Frequency of Communication with Friends and Family:   . Frequency of Social Gatherings with Friends and Family:   . Attends Religious Services:   . Active Member of Clubs or Organizations:   . Attends Archivist Meetings:   [name] Marital Status:   Intimate Partner Violence:   . Fear of Current or Ex-Partner:   . Emotionally Abused:   [name] Physically Abused:   . Sexually Abused:     Family History: Family History  Problem Relation Age of Onset  . Heart failure Mother   . Hypertension Mother   . Hyperlipidemia Mother   . Heart disease Mother   . Liver disease Father   . Alcohol abuse Father   . Colon cancer Neg Hx   . Esophageal cancer Neg Hx   . Stomach cancer Neg Hx     Review of Systems: All other systems reviewed and are otherwise negative except as noted  above.   Physical Exam: VS:  BP 116/68   Pulse (!) 59   Ht 5\' 3"  (1.6 m)   Wt 146 lb 9.6 oz (66.5 kg)   SpO2 98%   BMI 25.97 kg/m  , BMI Body mass index is 25.97 kg/m. Wt Readings from Last 3 Encounters:  12/23/19 146 lb 9.6 oz (66.5 kg)  11/14/19 149 lb (67.6 kg)  10/25/19 149 lb (67.6 kg)    GEN- The patient is well appearing, alert and oriented x 3 today.   HEENT: normocephalic, atraumatic; sclera clear, conjunctiva pink; hearing intact; oropharynx clear; neck supple  Lungs- Clear to ausculation bilaterally, normal work of breathing.  No wheezes, rales, rhonchi Heart- Regular rate and rhythm  GI- soft, non-tender, non-distended, bowel sounds present, no hepatosplenomegaly Extremities- no clubbing, cyanosis, or edema  MS- no significant deformity or atrophy Skin- warm and dry, no rash or lesion  Psych- euthymic mood, full affect Neuro- strength and sensation are intact   EKG:  EKG is not ordered today.  Recent Labs: No results found for requested labs within last 8760 hours.    Other studies Reviewed: Additional studies/ records that were reviewed today include: Dr Jackalyn Lombard office notes    Assessment and Plan:  1.  Paroxysmal atrial fibrillation Burden by device interrogation 3.9% V rates controlled Continue Xarelto for CHADS2VASC of 3 We discussed treatment options for AF including alternate AAD therapy and ablation at length today as well as prior test results Will plan 4 week trial of CPAP. If AF burden persists, will plan to consider ablation at that time  2.  OSA She will trial CPAP. Dr Theodosia Blender team made aware today  3.  HTN Stable No change required today    Current medicines are reviewed at length with the patient today.   The patient does not have concerns regarding her medicines.  The following changes were made today:  none  Labs/ tests ordered today include: none No orders of the defined types were placed in this  encounter.    Disposition:   Follow up with me in 4 weeks.     Signed, [name], NP 12/23/2019 10:54 AM   [address] [place] [address] [phone] (office) [phone] (fax)

## 2019-12-22 ENCOUNTER — Telehealth: Payer: Self-pay | Admitting: Emergency Medicine

## 2019-12-22 NOTE — Telephone Encounter (Signed)
Pt returned [place] phone call. I told her Jenny Reichmann will call her back soon.

## 2019-12-22 NOTE — Telephone Encounter (Addendum)
LMOM to call office and device clinic # provided. Alert received for symptom activator use  on 12/21/19 which occured with episode of AF with RVR.

## 2019-12-22 NOTE — Telephone Encounter (Signed)
Patient reports she felt palpitations during the 12/21/19 episode. Patient denies CP, SOB, dizziness, or syncope. Patient did not have to take a prn dose of cardiazem. Reports episode resolved after a few seconds. Has appointment 12/23/19 with Lynnell Jude NP.

## 2019-12-23 ENCOUNTER — Ambulatory Visit: Payer: Medicare PPO | Admitting: Nurse Practitioner

## 2019-12-23 ENCOUNTER — Other Ambulatory Visit: Payer: Self-pay

## 2019-12-23 ENCOUNTER — Encounter: Payer: Self-pay | Admitting: Nurse Practitioner

## 2019-12-23 VITALS — BP 116/68 | HR 59 | Ht 63.0 in | Wt 146.6 lb

## 2019-12-23 DIAGNOSIS — I10 Essential (primary) hypertension: Secondary | ICD-10-CM

## 2019-12-23 DIAGNOSIS — D6869 Other thrombophilia: Secondary | ICD-10-CM

## 2019-12-23 DIAGNOSIS — G4733 Obstructive sleep apnea (adult) (pediatric): Secondary | ICD-10-CM

## 2019-12-23 DIAGNOSIS — I480 Paroxysmal atrial fibrillation: Secondary | ICD-10-CM | POA: Diagnosis not present

## 2019-12-23 NOTE — Patient Instructions (Addendum)
Medication Instructions:  none *If you need a refill on your cardiac medications before your next appointment, please call your pharmacy*   Lab Work: none If you have labs (blood work) drawn today and your tests are completely normal, you will receive your results only by: [name] MyChart Message (if you have MyChart) OR . A paper copy in the mail If you have any lab test that is abnormal or we need to change your treatment, we will call you to review the results.   Testing/Procedures: none   Follow-Up:  [name], our sleep specialist will call you regarding CPAP treatment At [facility], you and your health needs are our priority.  As part of our continuing mission to provide you with exceptional heart care, we have created designated Provider Care Teams.  These Care Teams include your primary Cardiologist (physician) and Advanced Practice Providers (APPs -  Physician Assistants and Nurse Practitioners) who all work together to provide you with the care you need, when you need it.  Your next appointment:  Someone will call you 4-6 weeks  The format for your next appointment:   In Person  Provider:   [name], NP   Other Instructions Remote monitoring is used to monitor your Pacemaker  from home. This monitoring reduces the number of office visits required to check your device to one time per year. It allows [place] to keep an eye on the functioning of your device to ensure it is working properly. You are scheduled for a device check from home on 01/19/20. You may send your transmission at any time that day. If you have a wireless device, the transmission will be sent automatically. After your physician reviews your transmission, you will receive a postcard with your next transmission date.

## 2020-01-10 DIAGNOSIS — K219 Gastro-esophageal reflux disease without esophagitis: Secondary | ICD-10-CM | POA: Diagnosis not present

## 2020-01-10 DIAGNOSIS — Z6827 Body mass index (BMI) 27.0-27.9, adult: Secondary | ICD-10-CM | POA: Diagnosis not present

## 2020-01-10 DIAGNOSIS — I480 Paroxysmal atrial fibrillation: Secondary | ICD-10-CM | POA: Diagnosis not present

## 2020-01-10 DIAGNOSIS — R7303 Prediabetes: Secondary | ICD-10-CM | POA: Diagnosis not present

## 2020-01-10 DIAGNOSIS — E213 Hyperparathyroidism, unspecified: Secondary | ICD-10-CM | POA: Diagnosis not present

## 2020-01-13 DIAGNOSIS — G4733 Obstructive sleep apnea (adult) (pediatric): Secondary | ICD-10-CM | POA: Diagnosis not present

## 2020-01-16 DIAGNOSIS — H2513 Age-related nuclear cataract, bilateral: Secondary | ICD-10-CM | POA: Diagnosis not present

## 2020-01-16 DIAGNOSIS — H43813 Vitreous degeneration, bilateral: Secondary | ICD-10-CM | POA: Diagnosis not present

## 2020-01-16 DIAGNOSIS — H524 Presbyopia: Secondary | ICD-10-CM | POA: Diagnosis not present

## 2020-01-16 DIAGNOSIS — R7303 Prediabetes: Secondary | ICD-10-CM | POA: Diagnosis not present

## 2020-01-17 LAB — CUP PACEART REMOTE DEVICE CHECK
Date Time Interrogation Session: 20210413075555
Implantable Pulse Generator Implant Date: 20210208

## 2020-01-19 ENCOUNTER — Ambulatory Visit (INDEPENDENT_AMBULATORY_CARE_PROVIDER_SITE_OTHER): Payer: Medicare PPO | Admitting: *Deleted

## 2020-01-19 DIAGNOSIS — I480 Paroxysmal atrial fibrillation: Secondary | ICD-10-CM

## 2020-01-19 NOTE — Progress Notes (Signed)
ILR Remote 

## 2020-01-20 ENCOUNTER — Encounter: Payer: Medicare PPO | Admitting: Nurse Practitioner

## 2020-02-07 ENCOUNTER — Other Ambulatory Visit: Payer: Self-pay | Admitting: Physician Assistant

## 2020-02-12 DIAGNOSIS — G4733 Obstructive sleep apnea (adult) (pediatric): Secondary | ICD-10-CM | POA: Diagnosis not present

## 2020-02-17 ENCOUNTER — Other Ambulatory Visit: Payer: Self-pay | Admitting: Internal Medicine

## 2020-02-20 ENCOUNTER — Ambulatory Visit (INDEPENDENT_AMBULATORY_CARE_PROVIDER_SITE_OTHER): Payer: Medicare PPO | Admitting: *Deleted

## 2020-02-20 DIAGNOSIS — I480 Paroxysmal atrial fibrillation: Secondary | ICD-10-CM | POA: Diagnosis not present

## 2020-02-20 LAB — CUP PACEART REMOTE DEVICE CHECK
Date Time Interrogation Session: 20210514075406
Implantable Pulse Generator Implant Date: 20210208

## 2020-02-21 NOTE — Progress Notes (Signed)
Carelink Summary Report / Loop Recorder 

## 2020-02-22 DIAGNOSIS — I480 Paroxysmal atrial fibrillation: Secondary | ICD-10-CM | POA: Diagnosis not present

## 2020-02-22 DIAGNOSIS — K219 Gastro-esophageal reflux disease without esophagitis: Secondary | ICD-10-CM | POA: Diagnosis not present

## 2020-02-22 DIAGNOSIS — R7303 Prediabetes: Secondary | ICD-10-CM | POA: Diagnosis not present

## 2020-02-22 DIAGNOSIS — E1165 Type 2 diabetes mellitus with hyperglycemia: Secondary | ICD-10-CM | POA: Diagnosis not present

## 2020-02-23 NOTE — Progress Notes (Signed)
Virtual Visit via Telephone Note   This visit type was conducted due to national recommendations for restrictions regarding the COVID-19 Pandemic (e.g. social distancing) in an effort to limit this patient's exposure and mitigate transmission in our community.  Due to her co-morbid illnesses, this patient is at least at moderate risk for complications without adequate follow up.  This format is felt to be most appropriate for this patient at this time.  The patient did not have access to video technology/had technical difficulties with video requiring transitioning to audio format only (telephone).  All issues noted in this document were discussed and addressed.  No physical exam could be performed with this format.  Please refer to the patient's chart for her  consent to telehealth for [facility].   Evaluation Performed:  Follow-up visit  This visit type was conducted due to national recommendations for restrictions regarding the COVID-19 Pandemic (e.g. social distancing).  This format is felt to be most appropriate for this patient at this time.  All issues noted in this document were discussed and addressed.  No physical exam was performed (except for noted visual exam findings with Video Visits).  Please refer to the patient's chart (MyChart message for video visits and phone note for telephone visits) for the patient's consent to telehealth for [facility].  Date:  02/24/2020   ID:  Kathryn, Booker 11/22/1950, MRN [id number]  Patient Location:  Home  Provider location:   [place]  PCP:  Deland Pretty, MD  Sleep Medicine:  Fransico Him, MD Electrophysiologist:  None   Chief Complaint:  OSA  History of Present Illness:    Kathryn Booker is a 69 y.o. female who presents via audio/video conferencing for a telehealth visit today.    This is a 69yo female with a hx of asthma, CAD, PAF, DM and COPD who was referred for sleep study by Dr. Rayann Heman due to PAF.  She was  found to have severe OSA with an AHI of 58.5/hr and mild O2 desats as low as 87%.  She underwent CPAP titraiton to 12cm H2O and is now here for followup evaluation.    She is doing well with her CPAP device and thinks that she has gotten used to it.  She tolerates the mask and feels the pressure is adequate.  Since going on CPAP she feels rested in the am and has no significant daytime sleepiness.  She feels like she is getting deeper sleep as she is dreaming more.  She denies any significant mouth or nasal dryness or nasal congestion.  She does not think that he snores.    The patient does not have symptoms concerning for COVID-19 infection (fever, chills, cough, or new shortness of breath).    Prior CV studies:   The following studies were reviewed today:  Sleep study, CPAP titration and PAP compliance download  Past Medical History:  Diagnosis Date  . Allergies   . Anxiety   . Asthma   . COPD (chronic obstructive pulmonary disease) ([place])   . Coronary artery calcification seen on CT scan   . Depression   . Fatigue   . Former tobacco use   . Hyperlipidemia   . Paroxysmal atrial fibrillation (HCC)   . Pre-diabetes   . RBBB (right bundle branch block with left anterior fascicular block)   . Scoliosis   . Serum calcium elevated   . Thyroid disease    "questionable" per patient   Past Surgical History:  Procedure Laterality Date  . APPENDECTOMY    . BREAST EXCISIONAL BIOPSY Right 2017   benign  . implantable loop recorder placement  11/14/2019   Medtronic Reveal Linq model LNQ 2 (SN [id number]) implantable loop recorder   . LAPROSCOPIC     X 2 IN EARLY 30-40'S  . OVARIAN CYST REMOVAL    . RADIOACTIVE SEED GUIDED EXCISIONAL BREAST BIOPSY Right 05/15/2016   Procedure: RIGHT RADIOACTIVE SEED GUIDED EXCISIONAL BREAST BIOPSY;  Surgeon: Rolm Bookbinder, MD;  Location: [facility];  Service: General;  Laterality: Right;  RIGHT RADIOACTIVE SEED GUIDED EXCISIONAL BREAST  BIOPSY  . TONSILECTOMY, ADENOIDECTOMY, BILATERAL MYRINGOTOMY AND TUBES       Current Meds  Medication Sig  . albuterol (PROVENTIL HFA;VENTOLIN HFA) 108 (90 Base) MCG/ACT inhaler Use 2 inhalations 15 minutes apart every 4 hours to rescue Asthma  . azelastine (ASTELIN) 0.1 % nasal spray SPRAY 2 SPRAYS INTO EACH NOSTRIL TWICE A DAY AS DIRECTED  . buPROPion (WELLBUTRIN XL) 300 MG 24 hr tablet Take 1 tablet every Morning or Mood, Focus & Concentration  . cetirizine (ZYRTEC) 10 MG tablet Take 10 mg by mouth daily.  . cholecalciferol (VITAMIN D3) 25 MCG (1000 UNIT) tablet Take 2,000 Units by mouth daily.   . citalopram (CELEXA) 40 MG tablet Take 1 tablet Daily for Mood  . diltiazem (CARDIZEM CD) 240 MG 24 hr capsule Take 1 capsule (240 mg total) by mouth daily.  [name] diltiazem (CARDIZEM) 30 MG tablet Take 1 tablet Daily for  rapid Heart Beat  . ezetimibe (ZETIA) 10 MG tablet TAKE 1 TABLET BY MOUTH EVERY DAY  . fluticasone (FLONASE) 50 MCG/ACT nasal spray PLACE 1 SPRAY INTO EACH NOSTRIL ONCE DAILY  . ipratropium (ATROVENT) 0.06 % nasal spray Place 2 sprays into both nostrils 2 (two) times daily.  . magnesium oxide (MAG-OX) 400 MG tablet Take 400 mg by mouth daily.  . montelukast (SINGULAIR) 10 MG tablet TAKE 1 TABLET BY MOUTH EVERYDAY AT BEDTIME  . Omega-3 Fatty Acids (FISH OIL) 1000 MG CAPS Take 1 capsule by mouth.  [name] OVER THE COUNTER MEDICATION Eye drop 1 drop each eye daily.  . rivaroxaban (XARELTO) 20 MG TABS tablet Take 1 tablet Daily to Prevent Blood Clots  . valACYclovir (VALTREX) 500 MG tablet Take 1 tablet Daily for Prevention Fever Blisters  . WIXELA INHUB 250-50 MCG/DOSE AEPB INHALE 1 PUFF INTO THE LUNGS TWICE A DAY**RINSE MOUTH AFTER INHALATION     Allergies:   Lipitor [atorvastatin] and Tetanus toxoids   Social History   Tobacco Use  . Smoking status: Former Smoker    Packs/day: 2.00    Years: 35.00    Pack years: 70.00    Types: Cigarettes    Quit date: 11/19/2013    Years  since quitting: 6.2  . Smokeless tobacco: Never Used  Substance Use Topics  . Alcohol use: Yes    Alcohol/week: 0.0 standard drinks    Comment: social  . Drug use: No     Family Hx: The patient's family history includes Alcohol abuse in her father; Heart disease in her mother; Heart failure in her mother; Hyperlipidemia in her mother; Hypertension in her mother; Liver disease in her father. There is no history of Colon cancer, Esophageal cancer, or Stomach cancer.  ROS:   Please see the history of present illness.     All other systems reviewed and are negative.   Labs/Other Tests and Data Reviewed:    Recent Labs: No results found  for requested labs within last 8760 hours.   Recent Lipid Panel Lab Results  Component Value Date/Time   CHOL 227 (H) 05/27/2018 12:01 PM   TRIG 160 (H) 05/27/2018 12:01 PM   HDL 60 05/27/2018 12:01 PM   CHOLHDL 3.8 05/27/2018 12:01 PM   LDLCALC 137 (H) 05/27/2018 12:01 PM    Wt Readings from Last 3 Encounters:  02/24/20 146 lb (66.2 kg)  12/23/19 146 lb 9.6 oz (66.5 kg)  11/14/19 149 lb (67.6 kg)     Objective:    Vital Signs:  BP 117/60   Pulse 63   Ht 5\' 3"  (1.6 m)   Wt 146 lb (66.2 kg)   BMI 25.86 kg/m    CONSTITUTIONAL:  Well nourished, well developed female in no acute distress.  EYES: anicteric MOUTH: oral mucosa is pink RESPIRATORY: Normal respiratory effort, symmetric expansion CARDIOVASCULAR: No peripheral edema SKIN: No rash, lesions or ulcers MUSCULOSKELETAL: no digital cyanosis NEURO: Cranial Nerves II-XII grossly intact, moves all extremities PSYCH: Intact judgement and insight.  A&O x 3, Mood/affect appropriate   ASSESSMENT & PLAN:    1.  OSA - The pathophysiology of obstructive sleep apnea , it's cardiovascular consequences & modes of treatment including CPAP were discused with the patient in detail & they evidenced understanding.  The patient is tolerating PAP therapy well without any problems. The PAP download  was reviewed today and showed an AHI of 8.3/hr on 12 cm H2O with 100% compliance in using more than 4 hours nightly.  The patient has been using and benefiting from PAP use and will continue to benefit from therapy.  -I will increase her PAP to 13cm H2O and get a download in 4 weeks  2.  HTN -BP controlled on exam -continue Cardizem CD 240mg  daily  3.  Obesity -I have encouraged her to get into a routine exercise program and cut back on carbs and portions.   COVID-19 Education: The signs and symptoms of COVID-19 were discussed with the patient and how to seek care for testing (follow up with PCP or arrange E-visit).  The importance of social distancing was discussed today.  Patient Risk:   After full review of this patient's clinical status, I feel that they are at least moderate risk at this time.  Time:   Today, I have spent 20 minutes on telemedicine discussing medical problems including OSA, HTN and obesity and reviewing patient's chart including PAP compliance download, PSG and CPAP titration.  Medication Adjustments/Labs and Tests Ordered: Current medicines are reviewed at length with the patient today.  Concerns regarding medicines are outlined above.  Tests Ordered: No orders of the defined types were placed in this encounter.  Medication Changes: No orders of the defined types were placed in this encounter.   Disposition:  Follow up in 1 year(s)  Signed, Fransico Him, MD  02/24/2020 9:09 AM    [place]

## 2020-02-24 ENCOUNTER — Telehealth (INDEPENDENT_AMBULATORY_CARE_PROVIDER_SITE_OTHER): Payer: Medicare PPO | Admitting: Cardiology

## 2020-02-24 ENCOUNTER — Other Ambulatory Visit: Payer: Self-pay

## 2020-02-24 ENCOUNTER — Telehealth: Payer: Self-pay | Admitting: *Deleted

## 2020-02-24 VITALS — BP 117/60 | HR 63 | Ht 63.0 in | Wt 146.0 lb

## 2020-02-24 DIAGNOSIS — I10 Essential (primary) hypertension: Secondary | ICD-10-CM | POA: Diagnosis not present

## 2020-02-24 DIAGNOSIS — G4733 Obstructive sleep apnea (adult) (pediatric): Secondary | ICD-10-CM

## 2020-02-24 DIAGNOSIS — E6609 Other obesity due to excess calories: Secondary | ICD-10-CM

## 2020-02-24 NOTE — Telephone Encounter (Signed)
Kathryn Margarita, MD  Freada Bergeron, CMA  Increase CPAP to 13cm H2O and get a download in 4 weeks. Followup with me in 1 year

## 2020-02-24 NOTE — Telephone Encounter (Signed)
  Patient Consent for Virtual Visit         Kathryn Booker has provided verbal consent on 02/24/2020 for a virtual visit (video or telephone).   CONSENT FOR VIRTUAL VISIT FOR:  Kathryn Booker  By participating in this virtual visit I agree to the following:  I hereby voluntarily request, consent and authorize [facility] and its employed or contracted physicians, physician assistants, nurse practitioners or other licensed health care professionals (the Practitioner), to provide me with telemedicine health care services (the "Services") as deemed necessary by the treating Practitioner. I acknowledge and consent to receive the Services by the Practitioner via telemedicine. I understand that the telemedicine visit will involve communicating with the Practitioner through live audiovisual communication technology and the disclosure of certain medical information by electronic transmission. I acknowledge that I have been given the opportunity to request an in-person assessment or other available alternative prior to the telemedicine visit and am voluntarily participating in the telemedicine visit.  I understand that I have the right to withhold or withdraw my consent to the use of telemedicine in the course of my care at any time, without affecting my right to future care or treatment, and that the Practitioner or I may terminate the telemedicine visit at any time. I understand that I have the right to inspect all information obtained and/or recorded in the course of the telemedicine visit and may receive copies of available information for a reasonable fee.  I understand that some of the potential risks of receiving the Services via telemedicine include:  [name] Delay or interruption in medical evaluation due to technological equipment failure or disruption; . Information transmitted may not be sufficient (e.g. poor resolution of images) to allow for appropriate medical decision making by the  Practitioner; and/or  . In rare instances, security protocols could fail, causing a breach of personal health information.  Furthermore, I acknowledge that it is my responsibility to provide information about my medical history, conditions and care that is complete and accurate to the best of my ability. I acknowledge that Practitioner's advice, recommendations, and/or decision may be based on factors not within their control, such as incomplete or inaccurate data provided by me or distortions of diagnostic images or specimens that may result from electronic transmissions. I understand that the practice of medicine is not an exact science and that Practitioner makes no warranties or guarantees regarding treatment outcomes. I acknowledge that a copy of this consent can be made available to me via my patient portal ([facility]), or I can request a printed copy by calling the office of [facility].    I understand that my insurance will be billed for this visit.   I have read or had this consent read to me. . I understand the contents of this consent, which adequately explains the benefits and risks of the Services being provided via telemedicine.  . I have been provided ample opportunity to ask questions regarding this consent and the Services and have had my questions answered to my satisfaction. . I give my informed consent for the services to be provided through the use of telemedicine in my medical care

## 2020-02-29 DIAGNOSIS — F339 Major depressive disorder, recurrent, unspecified: Secondary | ICD-10-CM | POA: Diagnosis not present

## 2020-02-29 DIAGNOSIS — I480 Paroxysmal atrial fibrillation: Secondary | ICD-10-CM | POA: Diagnosis not present

## 2020-02-29 DIAGNOSIS — G4733 Obstructive sleep apnea (adult) (pediatric): Secondary | ICD-10-CM | POA: Diagnosis not present

## 2020-02-29 DIAGNOSIS — J4520 Mild intermittent asthma, uncomplicated: Secondary | ICD-10-CM | POA: Diagnosis not present

## 2020-02-29 DIAGNOSIS — N814 Uterovaginal prolapse, unspecified: Secondary | ICD-10-CM | POA: Diagnosis not present

## 2020-02-29 DIAGNOSIS — Z0000 Encounter for general adult medical examination without abnormal findings: Secondary | ICD-10-CM | POA: Diagnosis not present

## 2020-02-29 DIAGNOSIS — R7303 Prediabetes: Secondary | ICD-10-CM | POA: Diagnosis not present

## 2020-02-29 DIAGNOSIS — Z9989 Dependence on other enabling machines and devices: Secondary | ICD-10-CM | POA: Diagnosis not present

## 2020-02-29 DIAGNOSIS — J302 Other seasonal allergic rhinitis: Secondary | ICD-10-CM | POA: Diagnosis not present

## 2020-03-06 ENCOUNTER — Telehealth: Payer: Self-pay

## 2020-03-06 NOTE — Telephone Encounter (Signed)
[facility] to [facility]. Reminder made to get d/l in 4 weeks. 1 yr follow up made

## 2020-03-06 NOTE — Telephone Encounter (Signed)
Carelink alert received- Alert 1 AF 5.14 hours w/ symptom event. EGM shows AF w/ VR 100-150's. Known PAF on Xarelto.   Current meds include Diltaizem 240mg  Daily, with PRN dose of 30mg  for rapid HR.    Spoke with pt, she was aware of symptoms but did not correlate it with being AF.  She said she felt her HR skipping beats.  She didn't feel that the HR was racing.  She does report that she was under increased stress at time of episode as she was caring for grandchildren.  D/w pt indications for taking PRN diltiazem.

## 2020-03-13 ENCOUNTER — Other Ambulatory Visit: Payer: Self-pay | Admitting: Internal Medicine

## 2020-03-14 DIAGNOSIS — G4733 Obstructive sleep apnea (adult) (pediatric): Secondary | ICD-10-CM | POA: Diagnosis not present

## 2020-03-22 ENCOUNTER — Other Ambulatory Visit: Payer: Self-pay

## 2020-03-22 ENCOUNTER — Encounter: Payer: Self-pay | Admitting: Nurse Practitioner

## 2020-03-22 ENCOUNTER — Ambulatory Visit: Payer: Medicare PPO | Admitting: Nurse Practitioner

## 2020-03-22 VITALS — BP 120/68 | HR 60 | Ht 63.0 in | Wt 147.0 lb

## 2020-03-22 DIAGNOSIS — G4733 Obstructive sleep apnea (adult) (pediatric): Secondary | ICD-10-CM

## 2020-03-22 DIAGNOSIS — I10 Essential (primary) hypertension: Secondary | ICD-10-CM

## 2020-03-22 DIAGNOSIS — I480 Paroxysmal atrial fibrillation: Secondary | ICD-10-CM

## 2020-03-22 NOTE — Progress Notes (Signed)
Electrophysiology Office Note Date: 03/22/2020  ID:  Kathryn Booker, Kathryn Booker 04-07-1951, MRN [id number]  PCP: Deland Pretty, MD Electrophysiologist: Rayann Heman Sleep: Radford Pax   CC: AF follow up  Kathryn Booker is a 69 y.o. female seen today for Dr Rayann Heman.  She presents today for routine electrophysiology followup.  Since last being seen in our clinic, the patient reports doing relatively well.  She is tolerating CPAP and dreaming more.  She denies chest pain,  dyspnea, PND, orthopnea, nausea, vomiting, dizziness, syncope, edema, weight gain, or early satiety.  Past Medical History:  Diagnosis Date  . Allergies   . Anxiety   . Asthma   . COPD (chronic obstructive pulmonary disease) ([place])   . Coronary artery calcification seen on CT scan   . Depression   . Fatigue   . Former tobacco use   . Hyperlipidemia   . Paroxysmal atrial fibrillation (HCC)   . Pre-diabetes   . RBBB (right bundle branch block with left anterior fascicular block)   . Scoliosis   . Serum calcium elevated   . Thyroid disease    "questionable" per patient   Past Surgical History:  Procedure Laterality Date  . APPENDECTOMY    . BREAST EXCISIONAL BIOPSY Right 2017   benign  . implantable loop recorder placement  11/14/2019   Medtronic Reveal Linq model LNQ 2 (SN [id number]) implantable loop recorder   . LAPROSCOPIC     X 2 IN EARLY 30-40'S  . OVARIAN CYST REMOVAL    . RADIOACTIVE SEED GUIDED EXCISIONAL BREAST BIOPSY Right 05/15/2016   Procedure: RIGHT RADIOACTIVE SEED GUIDED EXCISIONAL BREAST BIOPSY;  Surgeon: Rolm Bookbinder, MD;  Location: [facility];  Service: General;  Laterality: Right;  RIGHT RADIOACTIVE SEED GUIDED EXCISIONAL BREAST BIOPSY  . TONSILECTOMY, ADENOIDECTOMY, BILATERAL MYRINGOTOMY AND TUBES      Current Outpatient Medications  Medication Sig Dispense Refill  . albuterol (PROVENTIL HFA;VENTOLIN HFA) 108 (90 Base) MCG/ACT inhaler Use 2 inhalations 15 minutes apart  every 4 hours to rescue Asthma 3 Inhaler 3  . azelastine (ASTELIN) 0.1 % nasal spray SPRAY 2 SPRAYS INTO EACH NOSTRIL TWICE A DAY AS DIRECTED 90 mL 3  . buPROPion (WELLBUTRIN XL) 300 MG 24 hr tablet Take 1 tablet every Morning or Mood, Focus & Concentration 90 tablet 1  . cetirizine (ZYRTEC) 10 MG tablet Take 10 mg by mouth daily.    . cholecalciferol (VITAMIN D3) 25 MCG (1000 UNIT) tablet Take 2,000 Units by mouth daily.     . citalopram (CELEXA) 40 MG tablet Take 1 tablet Daily for Mood 90 tablet 1  . diltiazem (CARDIZEM CD) 240 MG 24 hr capsule Take 1 capsule (240 mg total) by mouth daily. 90 capsule 1  . diltiazem (CARDIZEM) 30 MG tablet Take 1 tablet Daily for  rapid Heart Beat (Patient taking differently: as needed. Take 1 tablet Daily for  rapid Heart Beat) 90 tablet 1  . ezetimibe (ZETIA) 10 MG tablet TAKE 1 TABLET BY MOUTH EVERY DAY 90 tablet 1  . fluticasone (FLONASE) 50 MCG/ACT nasal spray PLACE 1 SPRAY INTO EACH NOSTRIL ONCE DAILY 48 g 3  . ipratropium (ATROVENT) 0.06 % nasal spray Place 2 sprays into both nostrils 2 (two) times daily.    . magnesium oxide (MAG-OX) 400 MG tablet Take 400 mg by mouth daily.    . Omega-3 Fatty Acids (FISH OIL) 1000 MG CAPS Take 1 capsule by mouth.    [name] [place]  drop 1 drop each eye daily.    . valACYclovir (VALTREX) 500 MG tablet Take 1 tablet Daily for Prevention Fever Blisters 90 tablet 3  . WIXELA INHUB 250-50 MCG/DOSE AEPB INHALE 1 PUFF INTO THE LUNGS TWICE A DAY**RINSE MOUTH AFTER INHALATION 180 each 1  . XARELTO 20 MG TABS tablet TAKE 1 TABLET DAILY TO PREVENT BLOOD CLOTS 90 tablet 0   No current facility-administered medications for this visit.    Allergies:   Lipitor [atorvastatin] and Tetanus toxoids   Social History: Social History   Socioeconomic History  . Marital status: Divorced    Spouse name: Not on file  . Number of children: Not on file  . Years of education: Not on file  . Highest education level: Not  on file  Occupational History  . Not on file  Tobacco Use  . Smoking status: Former Smoker    Packs/day: 2.00    Years: 35.00    Pack years: 70.00    Types: Cigarettes    Quit date: 11/19/2013    Years since quitting: 6.3  . Smokeless tobacco: Never Used  Vaping Use  . Vaping Use: Never used  Substance and Sexual Activity  . Alcohol use: Yes    Alcohol/week: 0.0 standard drinks    Comment: social  . Drug use: No  . Sexual activity: Not on file  Other Topics Concern  . Not on file  Social History Narrative   Divorced   No family   Works at [employer] more   Smokes 1.5 ppd   Sedentary   Stress in life   Social Determinants of Health   Financial Resource Strain:   . Difficulty of Paying Living Expenses:   Food Insecurity:   . Worried About Charity fundraiser in the Last Year:   . Arboriculturist in the Last Year:   Transportation Needs:   . Film/video editor (Medical):   [name] Lack of Transportation (Non-Medical):   Physical Activity:   . Days of Exercise per Week:   . Minutes of Exercise per Session:   Stress:   . Feeling of Stress :   Social Connections:   . Frequency of Communication with Friends and Family:   . Frequency of Social Gatherings with Friends and Family:   . Attends Religious Services:   . Active Member of Clubs or Organizations:   . Attends Archivist Meetings:   [name] Marital Status:   Intimate Partner Violence:   . Fear of Current or Ex-Partner:   . Emotionally Abused:   [name] Physically Abused:   . Sexually Abused:     Family History: Family History  Problem Relation Age of Onset  . Heart failure Mother   . Hypertension Mother   . Hyperlipidemia Mother   . Heart disease Mother   . Liver disease Father   . Alcohol abuse Father   . Colon cancer Neg Hx   . Esophageal cancer Neg Hx   . Stomach cancer Neg Hx     Review of Systems: All other systems reviewed and are otherwise negative except as noted  above.   Physical Exam: VS:  BP 120/68   Pulse 60   Ht 5\' 3"  (1.6 m)   Wt 147 lb (66.7 kg)   SpO2 98%   BMI 26.04 kg/m  , BMI Body mass index is 26.04 kg/m. Wt Readings from Last 3 Encounters:  03/22/20 147 lb (66.7 kg)  02/24/20 146  lb (66.2 kg)  12/23/19 146 lb 9.6 oz (66.5 kg)    GEN- The patient is well appearing, alert and oriented x 3 today.   HEENT: normocephalic, atraumatic; sclera clear, conjunctiva pink; hearing intact; oropharynx clear; neck supple  Lungs- Clear to ausculation bilaterally, normal work of breathing.  No wheezes, rales, rhonchi Heart- Regular rate and rhythm  GI- soft, non-tender, non-distended, bowel sounds present  Extremities- no clubbing, cyanosis, or edema; DP/PT/radial pulses 2+ bilaterally MS- no significant deformity or atrophy Skin- warm and dry, no rash or lesion  Psych- euthymic mood, full affect Neuro- strength and sensation are intact   EKG:  EKG is not ordered today.  Recent Labs: No results found for requested labs within last 8760 hours.    Assessment and Plan:  1.  Paroxysmal atrial fibrillation Burden by device interrogation 5.4% V rates controlled Continue Xarelto for CHADS2VASC of 3 She continues to have episodes of AF with OSA therapy.  We discussed options today at length.  She would like to continue current plan for now. We reviewed that ablation is a good option for recurrent episodes.  2.  OSA She has seen Dr Radford Pax and has started CPAP Compliance encouraged  3.  HTN Stable No change required today    Current medicines are reviewed at length with the patient today.   The patient does not have concerns regarding her medicines.  The following changes were made today:  none  Labs/ tests ordered today include: none No orders of the defined types were placed in this encounter.    Disposition:   Follow up with Dr Rayann Heman in 6 months    Signed, [name], NP 03/22/2020 12:03 PM   [facility] [address] [place] [address] [phone] (office) [phone] (fax)

## 2020-03-22 NOTE — Patient Instructions (Signed)
Medication Instructions:  *If you need a refill on your cardiac medications before your next appointment, please call your pharmacy*  Lab Work: If you have labs (blood work) drawn today and your tests are completely normal, you will receive your results only by: . MyChart Message (if you have MyChart) OR . A paper copy in the mail If you have any lab test that is abnormal or we need to change your treatment, we will call you to review the results.  Testing/Procedures: None  Follow-Up: At [facility], you and your health needs are our priority.  As part of our continuing mission to provide you with exceptional heart care, we have created designated Provider Care Teams.  These Care Teams include your primary Cardiologist (physician) and Advanced Practice Providers (APPs -  Physician Assistants and Nurse Practitioners) who all work together to provide you with the care you need, when you need it.  We recommend signing up for the patient portal called "MyChart".  Sign up information is provided on this After Visit Summary.  MyChart is used to connect with patients for Virtual Visits (Telemedicine).  Patients are able to view lab/test results, encounter notes, upcoming appointments, etc.  Non-urgent messages can be sent to your provider as well.   To learn more about what you can do with MyChart, go to [email or web address].    Your next appointment:   Your physician wants you to follow-up in: 6 MONTHS with Dr. Allred. You will receive a reminder letter in the mail two months in advance. If you don't receive a letter, please call our office to schedule the follow-up appointment.   The format for your next appointment:   In Person  Provider:   James Allred, MD    

## 2020-03-26 ENCOUNTER — Ambulatory Visit (INDEPENDENT_AMBULATORY_CARE_PROVIDER_SITE_OTHER): Payer: Medicare PPO | Admitting: *Deleted

## 2020-03-26 DIAGNOSIS — I480 Paroxysmal atrial fibrillation: Secondary | ICD-10-CM

## 2020-03-26 LAB — CUP PACEART REMOTE DEVICE CHECK
Date Time Interrogation Session: 20210620230506
Implantable Pulse Generator Implant Date: 20210208

## 2020-03-26 NOTE — Progress Notes (Signed)
Carelink Summary Report / Loop Recorder 

## 2020-04-13 DIAGNOSIS — G4733 Obstructive sleep apnea (adult) (pediatric): Secondary | ICD-10-CM | POA: Diagnosis not present

## 2020-04-26 ENCOUNTER — Other Ambulatory Visit: Payer: Self-pay | Admitting: Internal Medicine

## 2020-04-28 LAB — CUP PACEART REMOTE DEVICE CHECK
Date Time Interrogation Session: 20210723230237
Implantable Pulse Generator Implant Date: 20210208

## 2020-05-03 ENCOUNTER — Other Ambulatory Visit: Payer: Self-pay | Admitting: Internal Medicine

## 2020-05-03 DIAGNOSIS — N6489 Other specified disorders of breast: Secondary | ICD-10-CM

## 2020-05-14 DIAGNOSIS — G4733 Obstructive sleep apnea (adult) (pediatric): Secondary | ICD-10-CM | POA: Diagnosis not present

## 2020-05-16 ENCOUNTER — Other Ambulatory Visit: Payer: Self-pay | Admitting: Internal Medicine

## 2020-05-16 ENCOUNTER — Other Ambulatory Visit: Payer: Self-pay

## 2020-05-16 ENCOUNTER — Ambulatory Visit
Admission: RE | Admit: 2020-05-16 | Discharge: 2020-05-16 | Disposition: A | Discharge status: Home / Self Care | Payer: Medicare PPO | Source: Ambulatory Visit | Attending: Internal Medicine | Admitting: Internal Medicine

## 2020-05-16 DIAGNOSIS — N6320 Unspecified lump in the left breast, unspecified quadrant: Secondary | ICD-10-CM

## 2020-05-16 DIAGNOSIS — R928 Other abnormal and inconclusive findings on diagnostic imaging of breast: Secondary | ICD-10-CM | POA: Diagnosis not present

## 2020-05-16 DIAGNOSIS — N6489 Other specified disorders of breast: Secondary | ICD-10-CM | POA: Diagnosis not present

## 2020-05-28 ENCOUNTER — Telehealth: Payer: Self-pay | Admitting: Emergency Medicine

## 2020-05-28 NOTE — Telephone Encounter (Signed)
Received alert for use of symptom activator . Patient had episodes of AF with RVR that corresponded with the use of her symptom activator on 05/26/20 and 05/27/20. She reports "feeling funny". Reports no HA, CP, SOB, dizziness or syncope during the episodes. She reports taking prn cardizem 30 mg tablet on Saturday during episode and on 05/27/20 she took cardizem 30 mg x 2 during the episode that lasted 2 hrs and 6 min. No missed doses of Cardizem CD . + Xarelto.

## 2020-05-29 DIAGNOSIS — G4733 Obstructive sleep apnea (adult) (pediatric): Secondary | ICD-10-CM | POA: Diagnosis not present

## 2020-05-31 ENCOUNTER — Ambulatory Visit (INDEPENDENT_AMBULATORY_CARE_PROVIDER_SITE_OTHER): Payer: Medicare PPO | Admitting: *Deleted

## 2020-05-31 DIAGNOSIS — I480 Paroxysmal atrial fibrillation: Secondary | ICD-10-CM | POA: Diagnosis not present

## 2020-05-31 LAB — CUP PACEART REMOTE DEVICE CHECK
Date Time Interrogation Session: 20210825230445
Date Time Interrogation Session: 20210825230445
Implantable Pulse Generator Implant Date: 20210208
Implantable Pulse Generator Implant Date: 20210208

## 2020-06-01 ENCOUNTER — Telehealth: Payer: Self-pay | Admitting: Internal Medicine

## 2020-06-01 MED ORDER — DILTIAZEM HCL ER COATED BEADS 240 MG PO CP24: 240.0000 mg | ORAL_CAPSULE | Freq: Every day | ORAL | 2 refills | Status: DC

## 2020-06-01 NOTE — Telephone Encounter (Signed)
New message    *STAT* If patient is at the pharmacy, call can be transferred to refill team.   1. Which medications need to be refilled? (please list name of each medication and dose if known) diltiaziem 240 mg  2. Which pharmacy/location (including street and city if local pharmacy) is medication to be sent to? [facility] on [address]   3. Do they need a 30 day or 90 day supply? 90 day

## 2020-06-04 ENCOUNTER — Ambulatory Visit (HOSPITAL_COMMUNITY)
Admission: RE | Admit: 2020-06-04 | Discharge: 2020-06-04 | Disposition: A | Discharge status: Home / Self Care | Payer: Medicare PPO | Source: Ambulatory Visit | Attending: Nurse Practitioner | Admitting: Nurse Practitioner

## 2020-06-04 ENCOUNTER — Other Ambulatory Visit: Payer: Self-pay

## 2020-06-04 ENCOUNTER — Encounter (HOSPITAL_COMMUNITY): Payer: Self-pay | Admitting: Nurse Practitioner

## 2020-06-04 VITALS — BP 124/54 | HR 61 | Ht 63.0 in | Wt 148.8 lb

## 2020-06-04 DIAGNOSIS — R0609 Other forms of dyspnea: Secondary | ICD-10-CM

## 2020-06-04 DIAGNOSIS — E785 Hyperlipidemia, unspecified: Secondary | ICD-10-CM | POA: Insufficient documentation

## 2020-06-04 DIAGNOSIS — Z9989 Dependence on other enabling machines and devices: Secondary | ICD-10-CM | POA: Diagnosis not present

## 2020-06-04 DIAGNOSIS — M419 Scoliosis, unspecified: Secondary | ICD-10-CM | POA: Insufficient documentation

## 2020-06-04 DIAGNOSIS — I480 Paroxysmal atrial fibrillation: Secondary | ICD-10-CM | POA: Diagnosis not present

## 2020-06-04 DIAGNOSIS — Z87891 Personal history of nicotine dependence: Secondary | ICD-10-CM | POA: Insufficient documentation

## 2020-06-04 DIAGNOSIS — R0600 Dyspnea, unspecified: Secondary | ICD-10-CM

## 2020-06-04 DIAGNOSIS — D6869 Other thrombophilia: Secondary | ICD-10-CM

## 2020-06-04 DIAGNOSIS — F419 Anxiety disorder, unspecified: Secondary | ICD-10-CM | POA: Insufficient documentation

## 2020-06-04 DIAGNOSIS — G4733 Obstructive sleep apnea (adult) (pediatric): Secondary | ICD-10-CM | POA: Diagnosis not present

## 2020-06-04 DIAGNOSIS — Z7901 Long term (current) use of anticoagulants: Secondary | ICD-10-CM | POA: Insufficient documentation

## 2020-06-04 DIAGNOSIS — I2510 Atherosclerotic heart disease of native coronary artery without angina pectoris: Secondary | ICD-10-CM | POA: Insufficient documentation

## 2020-06-04 DIAGNOSIS — R7303 Prediabetes: Secondary | ICD-10-CM | POA: Insufficient documentation

## 2020-06-04 DIAGNOSIS — J449 Chronic obstructive pulmonary disease, unspecified: Secondary | ICD-10-CM | POA: Insufficient documentation

## 2020-06-04 DIAGNOSIS — I4510 Unspecified right bundle-branch block: Secondary | ICD-10-CM | POA: Diagnosis not present

## 2020-06-04 DIAGNOSIS — Z8249 Family history of ischemic heart disease and other diseases of the circulatory system: Secondary | ICD-10-CM | POA: Insufficient documentation

## 2020-06-04 DIAGNOSIS — F329 Major depressive disorder, single episode, unspecified: Secondary | ICD-10-CM | POA: Diagnosis not present

## 2020-06-04 DIAGNOSIS — Z79899 Other long term (current) drug therapy: Secondary | ICD-10-CM | POA: Diagnosis not present

## 2020-06-04 DIAGNOSIS — Z888 Allergy status to other drugs, medicaments and biological substances status: Secondary | ICD-10-CM | POA: Diagnosis not present

## 2020-06-04 NOTE — Progress Notes (Signed)
Primary Care Physician: Deland Pretty, MD Referring Physician:Device clinic EP: Dr. Malva Cogan Kathryn Booker is a 69 y.o. 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 She is in SR today. 30 mg Cardizem helps to blunt the episodes.  Usually over in  2 hours.   She reports that she is using CPAP, no significant caffeine or alcohol. She did use Multaq in the past, stopped this drug as it made her hair fall out.  She has noted increase of afib over the last couple of weeks. She had been offered ablation  by Dr. Rayann Heman in the past but at the time was not ready.   She has noted increase of exertional dyspnea with activities over the last several months. This is when she is in SR. Denies any chest discomfort. She has had 3 vessel CAD noted on her Chest Ct's since 2019. She is on xarelto 20 mg daily for a CHA2DS2VASc score of 3.   Today, she denies symptoms of palpitations, chest pain, shortness of breath, orthopnea, PND, lower extremity edema, dizziness, presyncope, syncope, or neurologic sequela. The patient is tolerating medications without difficulties and is otherwise without complaint today.   Past Medical History:  Diagnosis Date  . Allergies   . Anxiety   . Asthma   . COPD (chronic obstructive pulmonary disease) ([place])   . Coronary artery calcification seen on CT scan   . Depression   . Fatigue   . Former tobacco use   . Hyperlipidemia   . Paroxysmal atrial fibrillation (HCC)   . Pre-diabetes   . RBBB (right bundle branch block with left anterior fascicular block)   . Scoliosis   . Serum calcium elevated   . Thyroid disease    "questionable" per patient   Past Surgical History:  Procedure Laterality Date  . APPENDECTOMY    . BREAST EXCISIONAL BIOPSY Right 2017   benign  . implantable loop recorder placement  11/14/2019    Medtronic Reveal Linq model LNQ 2 (SN [id number]) implantable loop recorder   . LAPROSCOPIC     X 2 IN EARLY 30-40'S  . OVARIAN CYST REMOVAL    . RADIOACTIVE SEED GUIDED EXCISIONAL BREAST BIOPSY Right 05/15/2016   Procedure: RIGHT RADIOACTIVE SEED GUIDED EXCISIONAL BREAST BIOPSY;  Surgeon: Rolm Bookbinder, MD;  Location: [place];  Service: General;  Laterality: Right;  RIGHT RADIOACTIVE SEED GUIDED EXCISIONAL BREAST BIOPSY  . TONSILECTOMY, ADENOIDECTOMY, BILATERAL MYRINGOTOMY AND TUBES      Current Outpatient Medications  Medication Sig Dispense Refill  . albuterol (PROVENTIL HFA;VENTOLIN HFA) 108 (90 Base) MCG/ACT inhaler Use 2 inhalations 15 minutes apart every 4 hours to rescue Asthma 3 Inhaler 3  . azelastine (ASTELIN) 0.1 % nasal spray SPRAY 2 SPRAYS INTO EACH NOSTRIL TWICE A DAY AS DIRECTED 90 mL 3  . buPROPion (WELLBUTRIN XL) 300 MG 24 hr tablet Take 1 tablet every Morning or Mood, Focus & Concentration 90 tablet 1  . cetirizine (ZYRTEC) 10 MG tablet Take 10 mg by mouth daily.    . cholecalciferol (VITAMIN D3) 25 MCG (1000 UNIT) tablet Take 1,000 Units by mouth daily.     . citalopram (CELEXA) 40 MG tablet Take 1 tablet Daily for Mood (Patient taking differently: Taking 1/2 tablet Daily for Mood- 20mg ) 90 tablet 0  . cyclobenzaprine (FLEXERIL) 10  MG tablet Take 10 mg by mouth at bedtime as needed.     . diltiazem (CARDIZEM CD) 240 MG 24 hr capsule Take 1 capsule (240 mg total) by mouth daily. 90 capsule 2  . diltiazem (CARDIZEM) 30 MG tablet Take 1 tablet Daily for  rapid Heart Beat (Patient taking differently: as needed. Take 1 tablet Daily for  rapid Heart Beat) 90 tablet 1  . ezetimibe (ZETIA) 10 MG tablet TAKE 1 TABLET BY MOUTH EVERY DAY 90 tablet 1  . fluticasone (FLONASE) 50 MCG/ACT nasal spray PLACE 1 SPRAY INTO EACH NOSTRIL ONCE DAILY 48 g 3  . ipratropium (ATROVENT) 0.06 % nasal spray Place 2 sprays into both nostrils 2 (two) times daily.    . magnesium oxide  (MAG-OX) 400 MG tablet Take 400 mg by mouth daily.    . montelukast (SINGULAIR) 10 MG tablet Take 10 mg by mouth at bedtime.     . Omega-3 Fatty Acids (FISH OIL) 1000 MG CAPS Take 1 capsule by mouth.    [name] OVER THE COUNTER MEDICATION Eye drop- 1 drop each eye as needed. Equate dry eye    . valACYclovir (VALTREX) 500 MG tablet Take 1 tablet Daily for Prevention Fever Blisters 90 tablet 3  . WIXELA INHUB 250-50 MCG/DOSE AEPB INHALE 1 PUFF INTO THE LUNGS TWICE A DAY**RINSE MOUTH AFTER INHALATION 180 each 1  . XARELTO 20 MG TABS tablet TAKE 1 TABLET DAILY TO PREVENT BLOOD CLOTS 90 tablet 0   No current facility-administered medications for this encounter.    Allergies  Allergen Reactions  . Lipitor [Atorvastatin]     Myalgias   . Tetanus Toxoids Itching    Social History   Socioeconomic History  . Marital status: Divorced    Spouse name: Not on file  . Number of children: Not on file  . Years of education: Not on file  . Highest education level: Not on file  Occupational History  . Not on file  Tobacco Use  . Smoking status: Former Smoker    Packs/day: 2.00    Years: 35.00    Pack years: 70.00    Types: Cigarettes    Quit date: 11/19/2013    Years since quitting: 6.5  . Smokeless tobacco: Never Used  Vaping Use  . Vaping Use: Never used  Substance and Sexual Activity  . Alcohol use: Yes    Alcohol/week: 0.0 standard drinks    Comment: social  . Drug use: No  . Sexual activity: Not on file  Other Topics Concern  . Not on file  Social History Narrative   Divorced   No family   Works at [employer] more   Smokes 1.5 ppd   Sedentary   Stress in life   Social Determinants of Health   Financial Resource Strain:   . Difficulty of Paying Living Expenses: Not on file  Food Insecurity:   . Worried About Charity fundraiser in the Last Year: Not on file  . Ran Out of Food in the Last Year: Not on file  Transportation Needs:   . Lack of Transportation  (Medical): Not on file  . Lack of Transportation (Non-Medical): Not on file  Physical Activity:   . Days of Exercise per Week: Not on file  . Minutes of Exercise per Session: Not on file  Stress:   . Feeling of Stress : Not on file  Social Connections:   . Frequency of Communication with Friends and Family: Not  on file  . Frequency of Social Gatherings with Friends and Family: Not on file  . Attends Religious Services: Not on file  . Active Member of Clubs or Organizations: Not on file  . Attends Archivist Meetings: Not on file  . Marital Status: Not on file  Intimate Partner Violence:   . Fear of Current or Ex-Partner: Not on file  . Emotionally Abused: Not on file  . Physically Abused: Not on file  . Sexually Abused: Not on file    Family History  Problem Relation Age of Onset  . Heart failure Mother   . Hypertension Mother   . Hyperlipidemia Mother   . Heart disease Mother   . Liver disease Father   . Alcohol abuse Father   . Colon cancer Neg Hx   . Esophageal cancer Neg Hx   . Stomach cancer Neg Hx     ROS- All systems are reviewed and negative except as per the HPI above  Physical Exam: Vitals:   06/04/20 1002  BP: (!) 124/54  Pulse: 61  Weight: 67.5 kg  Height: 5\' 3"  (1.6 m)   Wt Readings from Last 3 Encounters:  06/04/20 67.5 kg  03/22/20 66.7 kg  02/24/20 66.2 kg    Labs: Lab Results  Component Value Date   NA 137 08/25/2018   K 4.9 08/25/2018   CL 103 08/25/2018   CO2 24 08/25/2018   GLUCOSE 107 (H) 08/25/2018   BUN 19 08/25/2018   CREATININE 0.83 08/25/2018   CALCIUM 11.0 (H) 08/25/2018   PHOS 2.9 08/26/2018   MG 2.1 08/26/2018   No results found for: INR Lab Results  Component Value Date   CHOL 227 (H) 05/27/2018   HDL 60 05/27/2018   LDLCALC 137 (H) 05/27/2018   TRIG 160 (H) 05/27/2018     GEN- The patient is well appearing, alert and oriented x 3 today.   Head- normocephalic, atraumatic Eyes-  Sclera clear,  conjunctiva pink Ears- hearing intact Oropharynx- clear Neck- supple, no JVP Lymph- no cervical lymphadenopathy Lungs- Clear to ausculation bilaterally, normal work of breathing Heart- Regular rate and rhythm, no murmurs, rubs or gallops, PMI not laterally displaced GI- soft, NT, ND, + BS Extremities- no clubbing, cyanosis, or edema MS- no significant deformity or atrophy Skin- no rash or lesion Psych- euthymic mood, full affect Neuro- strength and sensation are intact  EKG-SR with PVC's,  RBBB, LAFB   Device clinic report-05/28/20-Received alert for use of symptom activator . Patient had episodes of AF with RVR that corresponded with the use of her symptom activator on 05/26/20 and 05/27/20. She reports "feeling funny". Reports no HA, CP, SOB, dizziness or syncope during the episodes. She reports taking prn cardizem 30 mg tablet on Saturday during episode and on 05/27/20 she took cardizem 30 mg x 2 during the episode that lasted 2 hrs and 6 min. No missed doses of Cardizem CD . + Xarelto  Assessment and Plan: 1. Paroxsymal afib Took Multaq in the past, stopped for hair loss Discussed pursing  ablation and she would like to discuss further with Dr. Rayann Heman, will request an appointment with him Continue Cardizem 240 mg daily Cardizem  30 mg as needed for breakthrough episodes    2. CHA2DS2VASc score of 3 Continue xarelto 20 mg daily  3. Exertional dyspnea( not associated with being in afib) Has felt progression of this for the last several months Noted 3 vessel disease on chest CT's in the past  Will  order a lexi [employer] C. Keyondre Hepburn, [facility] [address] [place], [place] [address] [phone]

## 2020-06-05 NOTE — Progress Notes (Signed)
Carelink Summary Report / Loop Recorder 

## 2020-06-06 ENCOUNTER — Telehealth (HOSPITAL_COMMUNITY): Payer: Self-pay | Admitting: *Deleted

## 2020-06-06 ENCOUNTER — Encounter (HOSPITAL_COMMUNITY): Payer: Self-pay | Admitting: *Deleted

## 2020-06-06 NOTE — Telephone Encounter (Signed)
Left message on voicemail per DPR in reference to upcoming appointment scheduled on 06/13/2020 at 1000 with detailed instructions given per Myocardial Perfusion Study Information Sheet for the test. LM to arrive 15 minutes early, and that it is imperative to arrive on time for appointment to keep from having the test rescheduled. If you need to cancel or reschedule your appointment, please call the office within 24 hours of your appointment. Failure to do so may result in a cancellation of your appointment, and a $50 no show fee. Phone number given for call back for any questions.  Mychart letter sent with instructions.Tanvi Gatling, [place]

## 2020-06-13 ENCOUNTER — Ambulatory Visit (HOSPITAL_COMMUNITY): Payer: Medicare PPO | Attending: Cardiovascular Disease

## 2020-06-13 ENCOUNTER — Other Ambulatory Visit: Payer: Self-pay

## 2020-06-13 DIAGNOSIS — R0600 Dyspnea, unspecified: Secondary | ICD-10-CM | POA: Insufficient documentation

## 2020-06-13 DIAGNOSIS — R0609 Other forms of dyspnea: Secondary | ICD-10-CM

## 2020-06-13 LAB — MYOCARDIAL PERFUSION IMAGING
LV dias vol: 72 mL (ref 46–106)
LV sys vol: 25 mL
Peak HR: 78 {beats}/min
Rest HR: 51 {beats}/min
SDS: 1
SRS: 2
SSS: 3
TID: 1.06

## 2020-06-13 MED ORDER — REGADENOSON 0.4 MG/5ML IV SOLN
0.4000 mg | Freq: Once | INTRAVENOUS | Status: AC
  Administered 2020-06-13: 0.4 mg via INTRAVENOUS

## 2020-06-13 MED ORDER — TECHNETIUM TC 99M TETROFOSMIN IV KIT
10.9000 | PACK | Freq: Once | INTRAVENOUS | Status: AC | PRN
  Administered 2020-06-13: 10.9 via INTRAVENOUS
  Filled 2020-06-13: qty 11

## 2020-06-13 MED ORDER — TECHNETIUM TC 99M TETROFOSMIN IV KIT
32.7000 | PACK | Freq: Once | INTRAVENOUS | Status: AC | PRN
  Administered 2020-06-13: 32.7 via INTRAVENOUS
  Filled 2020-06-13: qty 33

## 2020-06-14 DIAGNOSIS — G4733 Obstructive sleep apnea (adult) (pediatric): Secondary | ICD-10-CM | POA: Diagnosis not present

## 2020-06-17 ENCOUNTER — Other Ambulatory Visit: Payer: Self-pay | Admitting: Physician Assistant

## 2020-06-18 ENCOUNTER — Encounter (HOSPITAL_COMMUNITY): Payer: Self-pay | Admitting: *Deleted

## 2020-06-25 ENCOUNTER — Telehealth (INDEPENDENT_AMBULATORY_CARE_PROVIDER_SITE_OTHER): Payer: Medicare PPO | Admitting: Internal Medicine

## 2020-06-25 ENCOUNTER — Other Ambulatory Visit: Payer: Self-pay

## 2020-06-25 DIAGNOSIS — R0600 Dyspnea, unspecified: Secondary | ICD-10-CM

## 2020-06-25 NOTE — Progress Notes (Signed)
No show virtual visit.  VMs left for patient on home and cell numbers to reschedule.  Thompson Grayer MD, [place] 06/25/2020 12:18 PM

## 2020-07-03 ENCOUNTER — Ambulatory Visit (INDEPENDENT_AMBULATORY_CARE_PROVIDER_SITE_OTHER): Payer: Medicare PPO | Admitting: Emergency Medicine

## 2020-07-03 DIAGNOSIS — I480 Paroxysmal atrial fibrillation: Secondary | ICD-10-CM

## 2020-07-04 LAB — CUP PACEART REMOTE DEVICE CHECK
Date Time Interrogation Session: 20210927230219
Implantable Pulse Generator Implant Date: 20210208

## 2020-07-06 NOTE — Progress Notes (Signed)
Carelink Summary Report / Loop Recorder 

## 2020-07-09 ENCOUNTER — Ambulatory Visit (INDEPENDENT_AMBULATORY_CARE_PROVIDER_SITE_OTHER): Payer: Medicare PPO | Admitting: Internal Medicine

## 2020-07-09 ENCOUNTER — Encounter: Payer: Self-pay | Admitting: Internal Medicine

## 2020-07-09 ENCOUNTER — Other Ambulatory Visit: Payer: Self-pay

## 2020-07-09 VITALS — BP 112/72 | HR 60 | Ht 63.0 in | Wt 152.2 lb

## 2020-07-09 DIAGNOSIS — I480 Paroxysmal atrial fibrillation: Secondary | ICD-10-CM

## 2020-07-09 DIAGNOSIS — I10 Essential (primary) hypertension: Secondary | ICD-10-CM | POA: Diagnosis not present

## 2020-07-09 DIAGNOSIS — E782 Mixed hyperlipidemia: Secondary | ICD-10-CM | POA: Diagnosis not present

## 2020-07-09 DIAGNOSIS — G4733 Obstructive sleep apnea (adult) (pediatric): Secondary | ICD-10-CM | POA: Diagnosis not present

## 2020-07-09 NOTE — Progress Notes (Signed)
PCP: Deland Pretty, MD   Primary EP: Dr Malva Cogan Kathryn Booker is a 69 y.o. female who presents today for routine electrophysiology followup.  Since last being seen in our clinic, the patient reports doing very well.  Today, she denies symptoms of palpitations, chest pain, shortness of breath,  lower extremity edema, dizziness, presyncope, or syncope.  The patient is otherwise without complaint today.   Past Medical History:  Diagnosis Date  . Allergies   . Anxiety   . Asthma   . COPD (chronic obstructive pulmonary disease) ([place])   . Coronary artery calcification seen on CT scan   . Depression   . Fatigue   . Former tobacco use   . Hyperlipidemia   . Paroxysmal atrial fibrillation ([facility])   . Pre-diabetes   . RBBB (right bundle branch block with left anterior fascicular block)   . Scoliosis   . Serum calcium elevated   . Thyroid disease    "questionable" per patient   Past Surgical History:  Procedure Laterality Date  . APPENDECTOMY    . BREAST EXCISIONAL BIOPSY Right 2017   benign  . implantable loop recorder placement  11/14/2019   Medtronic Reveal Linq model LNQ 2 (SN [id number]) implantable loop recorder   . LAPROSCOPIC     X 2 IN EARLY 30-40'S  . OVARIAN CYST REMOVAL    . RADIOACTIVE SEED GUIDED EXCISIONAL BREAST BIOPSY Right 05/15/2016   Procedure: RIGHT RADIOACTIVE SEED GUIDED EXCISIONAL BREAST BIOPSY;  Surgeon: Rolm Bookbinder, MD;  Location: [place];  Service: General;  Laterality: Right;  RIGHT RADIOACTIVE SEED GUIDED EXCISIONAL BREAST BIOPSY  . TONSILECTOMY, ADENOIDECTOMY, BILATERAL MYRINGOTOMY AND TUBES      ROS- all systems are reviewed and negatives except as per HPI above  Current Outpatient Medications  Medication Sig Dispense Refill  . albuterol (PROVENTIL HFA;VENTOLIN HFA) 108 (90 Base) MCG/ACT inhaler Use 2 inhalations 15 minutes apart every 4 hours to rescue Asthma 3 Inhaler 3  . azelastine (ASTELIN) 0.1 % nasal spray SPRAY 2  SPRAYS INTO EACH NOSTRIL TWICE A DAY AS DIRECTED 90 mL 3  . buPROPion (WELLBUTRIN XL) 300 MG 24 hr tablet Take 1 tablet every Morning or Mood, Focus & Concentration 90 tablet 1  . cetirizine (ZYRTEC) 10 MG tablet Take 10 mg by mouth daily.    . cholecalciferol (VITAMIN D3) 25 MCG (1000 UNIT) tablet Take 1,000 Units by mouth daily.     . citalopram (CELEXA) 40 MG tablet Take 1 tablet Daily for Mood 90 tablet 0  . cyclobenzaprine (FLEXERIL) 10 MG tablet Take 10 mg by mouth at bedtime as needed.     . diltiazem (CARDIZEM CD) 240 MG 24 hr capsule Take 1 capsule (240 mg total) by mouth daily. 90 capsule 2  . diltiazem (CARDIZEM) 30 MG tablet Take 1 tablet Daily for  rapid Heart Beat 90 tablet 1  . ezetimibe (ZETIA) 10 MG tablet TAKE 1 TABLET BY MOUTH EVERY DAY 90 tablet 1  . fluticasone (FLONASE) 50 MCG/ACT nasal spray PLACE 1 SPRAY INTO EACH NOSTRIL ONCE DAILY 48 g 3  . ipratropium (ATROVENT) 0.06 % nasal spray Place 2 sprays into both nostrils 2 (two) times daily.    . magnesium oxide (MAG-OX) 400 MG tablet Take 400 mg by mouth daily.    . montelukast (SINGULAIR) 10 MG tablet Take 10 mg by mouth at bedtime.     . Omega-3 Fatty Acids (FISH OIL) 1000 MG CAPS Take 1 capsule by mouth.    [name]  OVER THE COUNTER MEDICATION Eye drop- 1 drop each eye as needed. Equate dry eye    . rivaroxaban (XARELTO) 20 MG TABS tablet Take 1 tablet Daily to Prevent Blood Clots 90 tablet 0  . valACYclovir (VALTREX) 500 MG tablet Take 1 tablet Daily for Prevention Fever Blisters 90 tablet 3  . WIXELA INHUB 250-50 MCG/DOSE AEPB INHALE 1 PUFF INTO THE LUNGS TWICE A DAY**RINSE MOUTH AFTER INHALATION 180 each 1   No current facility-administered medications for this visit.    Physical Exam: Vitals:   07/09/20 1207  BP: 112/72  Pulse: 60  SpO2: 96%  Weight: 152 lb 3.2 oz (69 kg)  Height: 5\' 3"  (1.6 m)    GEN- The patient is well appearing, alert and oriented x 3 today.   Head- normocephalic, atraumatic Eyes-  Sclera  clear, conjunctiva pink Ears- hearing intact Oropharynx- clear Lungs- Clear to ausculation bilaterally, normal work of breathing Heart- Regular rate and rhythm, no murmurs, rubs or gallops, PMI not laterally displaced GI- soft, NT, ND, + BS Extremities- no clubbing, cyanosis, or edema  Wt Readings from Last 3 Encounters:  07/09/20 152 lb 3.2 oz (69 kg)  06/13/20 148 lb (67.1 kg)  06/04/20 148 lb 12.8 oz (67.5 kg)    EKG tracing ordered today is personally reviewed and shows sinus with PVCs, short PR, RBBB, LAHB   Assessment and Plan:  1. Paroxysmal atrial fibrillation afib burden is 3.5 % by ILR She is symptomatic chads2vasc score is 3.  She is on xarelto She has failed medical therapy with multaq Therapeutic strategies for afib including medicine (flecainide, tikosyn, and amiodarone) and ablation were discussed in detail with the patient today.  At this time, she would prefer to continue watchful waiting.  She may be more willing to consider ablation if her AF burden increases. Update echo  2. HTN Stable No change required today Echo as above  3. OSA She is compliant with CPAP  4. CAD with atherosclerosis No ischemic symptoms She does not have known obstructive CAD, just atherosclerosis. Low risk myoview from 06/13/20 reviewed  Risks, benefits and potential toxicities for medications prescribed and/or refilled reviewed with patient today.   AF clinic in 3 months I will see in 6 months  Thompson Grayer MD, [facility] 07/09/2020 12:17 PM

## 2020-07-09 NOTE — Patient Instructions (Addendum)
Medication Instructions:  Your physician recommends that you continue on your current medications as directed. Please refer to the Current Medication list given to you today.  *If you need a refill on your cardiac medications before your next appointment, please call your pharmacy*  Lab Work: None ordered.  If you have labs (blood work) drawn today and your tests are completely normal, you will receive your results only by: [name] MyChart Message (if you have MyChart) OR . A paper copy in the mail If you have any lab test that is abnormal or we need to change your treatment, we will call you to review the results.  Testing/Procedures: please schedule ECHO Your physician has requested that you have an echocardiogram. Echocardiography is a painless test that uses sound waves to create images of your heart. It provides your doctor with information about the size and shape of your heart and how well your heart's chambers and valves are working. This procedure takes approximately one hour. There are no restrictions for this procedure.   Follow-Up: At [facility], you and your health needs are our priority.  As part of our continuing mission to provide you with exceptional heart care, we have created designated Provider Care Teams.  These Care Teams include your primary Cardiologist (physician) and Advanced Practice Providers (APPs -  Physician Assistants and Nurse Practitioners) who all work together to provide you with the care you need, when you need it.  We recommend signing up for the patient portal called "MyChart".  Sign up information is provided on this After Visit Summary.  MyChart is used to connect with patients for Virtual Visits (Telemedicine).  Patients are able to view lab/test results, encounter notes, upcoming appointments, etc.  Non-urgent messages can be sent to your provider as well.   To learn more about what you can do with MyChart, go to [email or web address].    Your next  appointment:   Your physician wants you to follow-up in: 3 months with the Afib clinic and 6 months with Dr. Rayann Heman.   Other Instructions:

## 2020-07-14 DIAGNOSIS — G4733 Obstructive sleep apnea (adult) (pediatric): Secondary | ICD-10-CM | POA: Diagnosis not present

## 2020-07-26 ENCOUNTER — Other Ambulatory Visit: Payer: Self-pay

## 2020-07-26 ENCOUNTER — Ambulatory Visit (HOSPITAL_COMMUNITY): Payer: Medicare PPO | Attending: Cardiology

## 2020-07-26 DIAGNOSIS — G4733 Obstructive sleep apnea (adult) (pediatric): Secondary | ICD-10-CM

## 2020-07-26 DIAGNOSIS — I10 Essential (primary) hypertension: Secondary | ICD-10-CM | POA: Insufficient documentation

## 2020-07-26 DIAGNOSIS — E782 Mixed hyperlipidemia: Secondary | ICD-10-CM

## 2020-07-26 DIAGNOSIS — I480 Paroxysmal atrial fibrillation: Secondary | ICD-10-CM | POA: Insufficient documentation

## 2020-07-26 LAB — ECHOCARDIOGRAM COMPLETE
AR max vel: 1.45 cm2
AV Area VTI: 1.74 cm2
AV Area mean vel: 1.58 cm2
AV Mean grad: 11 mmHg
AV Peak grad: 18.1 mmHg
Ao pk vel: 2.13 m/s
Area-P 1/2: 2.46 cm2
MV M vel: 5.47 m/s
MV Peak grad: 119.7 mmHg
Radius: 0.5 cm
S' Lateral: 3.3 cm

## 2020-08-02 ENCOUNTER — Other Ambulatory Visit: Payer: Self-pay

## 2020-08-02 ENCOUNTER — Ambulatory Visit (INDEPENDENT_AMBULATORY_CARE_PROVIDER_SITE_OTHER)
Admission: RE | Admit: 2020-08-02 | Discharge: 2020-08-02 | Disposition: A | Discharge status: Home / Self Care | Payer: Medicare PPO | Source: Ambulatory Visit | Attending: Acute Care | Admitting: Acute Care

## 2020-08-02 DIAGNOSIS — Z122 Encounter for screening for malignant neoplasm of respiratory organs: Secondary | ICD-10-CM

## 2020-08-02 DIAGNOSIS — Z87891 Personal history of nicotine dependence: Secondary | ICD-10-CM

## 2020-08-06 ENCOUNTER — Ambulatory Visit (INDEPENDENT_AMBULATORY_CARE_PROVIDER_SITE_OTHER): Payer: Medicare PPO

## 2020-08-06 DIAGNOSIS — I480 Paroxysmal atrial fibrillation: Secondary | ICD-10-CM | POA: Diagnosis not present

## 2020-08-06 LAB — CUP PACEART REMOTE DEVICE CHECK
Date Time Interrogation Session: 20211030230234
Implantable Pulse Generator Implant Date: 20210208

## 2020-08-06 NOTE — Progress Notes (Signed)
Please call patient and let them  know their  low dose Ct was read as a Lung RADS 1, negative study: no nodules or definitely benign nodules. Radiology recommendation is for a repeat LDCT in 12 months. .Please let them  know we will order and schedule their  annual screening scan for 07/2021. Please let them  know there was notation of CAD on their  scan.  Please remind the patient  that this is a non-gated exam therefore degree or severity of disease  cannot be determined. Please have them  follow up with their PCP regarding potential risk factor modification, dietary therapy or pharmacologic therapy if clinically indicated. Pt.  is  not currently on statin therapy. Please place order for annual  screening scan for  07/2021 and fax results to PCP. Thanks so much.  Kathryn Booker, she needs to follow up with PCP about hepatic steatosis. She is followed by cards. .Additionally  she has some findings on her CT that are concerning for possible pulmonary fibrosis. She needs referral to Dr. Vaughan Browner or Chase Caller to have that evaluated. ( Just let her know we will refer her to see one of them). Thanks so much.

## 2020-08-08 NOTE — Progress Notes (Signed)
Carelink Summary Report / Loop Recorder 

## 2020-08-10 ENCOUNTER — Ambulatory Visit: Payer: Medicare PPO | Attending: Internal Medicine

## 2020-08-10 ENCOUNTER — Other Ambulatory Visit: Payer: Self-pay | Admitting: Internal Medicine

## 2020-08-10 ENCOUNTER — Telehealth: Payer: Self-pay | Admitting: Acute Care

## 2020-08-10 DIAGNOSIS — Z23 Encounter for immunization: Secondary | ICD-10-CM

## 2020-08-10 DIAGNOSIS — Z87891 Personal history of nicotine dependence: Secondary | ICD-10-CM

## 2020-08-10 NOTE — Progress Notes (Signed)
   Covid-19 Vaccination Clinic  Name:  Shanita Kanan    MRN: [id number] DOB: 1951-05-10  08/10/2020  Ms. Okane was observed post Covid-19 immunization for 15 minutes without incident. She was provided with Vaccine Information Sheet and instruction to access the V-Safe system.   Ms. Drouillard was instructed to call 911 with any severe reactions post vaccine: [name] Difficulty breathing  . Swelling of face and throat  . A fast heartbeat  . A bad rash all over body  . Dizziness and weakness

## 2020-08-13 NOTE — Telephone Encounter (Signed)
Pt informed of CT results per Eric Form, NP.  PT verbalized understanding.  Copy sent to PCP.  Order placed for 1 yr f/u CT. Appt made with Dr Chase Caller 09/04/21 2:00.

## 2020-08-14 DIAGNOSIS — G4733 Obstructive sleep apnea (adult) (pediatric): Secondary | ICD-10-CM | POA: Diagnosis not present

## 2020-08-22 DIAGNOSIS — J069 Acute upper respiratory infection, unspecified: Secondary | ICD-10-CM | POA: Diagnosis not present

## 2020-08-22 DIAGNOSIS — I2510 Atherosclerotic heart disease of native coronary artery without angina pectoris: Secondary | ICD-10-CM | POA: Diagnosis not present

## 2020-08-22 DIAGNOSIS — J432 Centrilobular emphysema: Secondary | ICD-10-CM | POA: Diagnosis not present

## 2020-08-27 ENCOUNTER — Telehealth: Payer: Self-pay | Admitting: Student

## 2020-08-27 NOTE — Telephone Encounter (Signed)
Received alert due to symptom activator. Pt had occasional AF with RVR over the weekend. Known. Overall burden ~2%. She takes her prn diltiazem and feels better.  Discussed that she can continue to use the symptom activator as she has been, or with plan to follow burden and possibly discuss ablation, she can use it for any new or prolonged symptoms. Pt verbalizes understanding. Declines medication titration or visit at this time.   Will follow up as scheduled in AF clinic in January. She will alert [place] of any new or worsening symptoms.   Legrand Como [address] [place], PA-C  08/27/2020 10:07 AM

## 2020-09-04 ENCOUNTER — Other Ambulatory Visit: Payer: Self-pay

## 2020-09-04 ENCOUNTER — Ambulatory Visit: Payer: Medicare PPO | Admitting: Internal Medicine

## 2020-09-04 ENCOUNTER — Encounter: Payer: Self-pay | Admitting: Internal Medicine

## 2020-09-04 VITALS — BP 120/72 | HR 60 | Temp 98.5°F | Ht 63.0 in | Wt 153.0 lb

## 2020-09-04 DIAGNOSIS — R053 Chronic cough: Secondary | ICD-10-CM

## 2020-09-04 DIAGNOSIS — Z8709 Personal history of other diseases of the respiratory system: Secondary | ICD-10-CM | POA: Diagnosis not present

## 2020-09-04 DIAGNOSIS — Z87891 Personal history of nicotine dependence: Secondary | ICD-10-CM

## 2020-09-04 DIAGNOSIS — J849 Interstitial pulmonary disease, unspecified: Secondary | ICD-10-CM

## 2020-09-04 DIAGNOSIS — R0600 Dyspnea, unspecified: Secondary | ICD-10-CM | POA: Diagnosis not present

## 2020-09-04 DIAGNOSIS — R0609 Other forms of dyspnea: Secondary | ICD-10-CM

## 2020-09-04 DIAGNOSIS — J328 Other chronic sinusitis: Secondary | ICD-10-CM | POA: Diagnosis not present

## 2020-09-04 LAB — CBC WITH DIFFERENTIAL/PLATELET
Basophils Absolute: 0 10*3/uL (ref 0.0–0.1)
Basophils Relative: 0.6 % (ref 0.0–3.0)
Eosinophils Absolute: 0.2 10*3/uL (ref 0.0–0.7)
Eosinophils Relative: 2.6 % (ref 0.0–5.0)
HCT: 39.6 % (ref 36.0–46.0)
Hemoglobin: 13.5 g/dL (ref 12.0–15.0)
Lymphocytes Relative: 20.9 % (ref 12.0–46.0)
Lymphs Abs: 1.6 10*3/uL (ref 0.7–4.0)
MCHC: 34.1 g/dL (ref 30.0–36.0)
MCV: 95.1 fl (ref 78.0–100.0)
Monocytes Absolute: 0.5 10*3/uL (ref 0.1–1.0)
Monocytes Relative: 6 % (ref 3.0–12.0)
Neutro Abs: 5.4 10*3/uL (ref 1.4–7.7)
Neutrophils Relative %: 69.9 % (ref 43.0–77.0)
Platelets: 224 10*3/uL (ref 150.0–400.0)
RBC: 4.16 Mil/uL (ref 3.87–5.11)
RDW: 13.1 % (ref 11.5–15.5)
WBC: 7.7 10*3/uL (ref 4.0–10.5)

## 2020-09-04 NOTE — Patient Instructions (Addendum)
ICD-10-CM   1. Dyspnea on exertion  R06.00   2. Chronic cough  R05.3   3. Stopped smoking with greater than 40 pack year history  Z87.891   4. Other chronic sinusitis  J32.8   5. History of asthma  Z87.09       Get CT sinus without contrast Get HRCT chest supine and prone Get blood cbc with diff, IgE, ANA, DS-DNA, RF, CCP, ANCA, PR-3 and MPO Get full PFT Get FeNO breathing test  Fop[place]owup  -regroup after above with APP or DR Smita Lesh - 30 min slot

## 2020-09-04 NOTE — Progress Notes (Signed)
OV Feb 2018  Synopsis: First evaluated in December 2017 by [facility] pulmonary after heavy smoking history through 2015. She has a history of childhood asthma and has an adult took immunotherapy for 20 years.   HPI Chief Complaint  Patient presents with  . Follow-up    pt c/o pnd, prod cough.    Kathryn Booker says that her symptoms have been worsening lately. She had flu in January. Overall she's not improved.  Cough: > form a post nasal drip all the time > worse when she had influenza A in January  Influenza A in January > diagnosed in January > three rounds of antibiotics > guaifenesin helped  Asthma: > added [place], didn't help with her chest congestion and cough > she says that she can feel mucus in her chest, she just can't get it out  OV 09/04/2020  Subjective:  Patient ID: Kathryn Booker, female , DOB: Jul 29, 1951 , age 69 y.o. , MRN: [id number] , ADDRESS: [address] [place] [address] PCP Deland Pretty, MD Patient Care Team: Deland Pretty, MD as PCP - General (Internal Medicine) Sueanne Margarita, MD as PCP - Sleep Medicine (Cardiology) Josue Hector, MD as Consulting Physician (Cardiology) Inda Castle, MD (Inactive) as Consulting Physician (Gastroenterology) Lavonna Monarch, MD as Consulting Physician (Dermatology) Marygrace Drought, MD as Consulting Physician (Ophthalmology) Rolm Bookbinder, MD as Consulting Physician (General Surgery) Thompson Grayer, MD as Consulting Physician (Cardiology)  This Provider for this visit: Treatment Team:  Attending Provider: Brand Males, MD    09/04/2020 -   Chief Complaint  Patient presents with  . New Patient (Initial Visit)    Low dose CT screening follow up     HPI Kathryn Booker 69 y.o. -female former patient of Dr. Roselie Awkward.  Former 70 pack smoker last seen by Dr. [name] over 3 years ago.  Therefore this is a new visit.  She has a history of sleep apnea for which she uses CPAP.  She  also has a history of allergic asthma in the past.  She remembers seeing Dr. Caprice Red and apparently her skin test was extremely positive.  She feels that still some asthma but essentially this is not a major issue at this point.  In the last few to several years has been dealing with chronic sinus drainage and postnasal drip and a sensation of blocked nose from deviated nasal septum.  She is frustrated by this.  2018 CT sinuses was clear.  This really bothers her.  In 2018 she had pulmonary function test documented below.  After that she was told that she was more like an asthma patient as opposed to a COPD patient according to history.  However most recently she has had insidious onset of shortness of breath that is progressively worse this past year.  Symptom scores are listed below.  She says in the context of this she had a routine low-dose CT scan of the chest because of a previous history of 70 pack smoking.  This showed new onset interstitial abnormalities particularly in the lung base.  Therefore she is worried and therefore she has been referred here to the ILD center.  She has significant amount of sinus drainage, shortness of breath as below and cough with constant clearing of the throat these are her main symptoms.  Her medication list is reviewed.    SYMPTOM SCALE - ILD 09/04/2020   O2 use ra  Shortness of Breath 0 -> 5 scale with 5  being worst (score 6 If unable to do)  At rest 0.25  Simple tasks - showers, clothes change, eating, shaving 3.5  Household (dishes, doing bed, laundry) 3.5  Shopping 0.5  Walking level at own pace 1  Walking up Stairs 3.5  Total (30-36) Dyspnea Score 12.25  How bad is your cough? 5  How bad is your fatigue 1  How bad is nausea 0  How bad is vomiting?  00  How bad is diarrhea? 0  How bad is anxiety? 0  How bad is depression 0         PFT Results Latest Ref Rng & Units 09/16/2016  FVC-Pre L 2.53  FVC-Predicted Pre % 82  FVC-Post L 2.74   FVC-Predicted Post % 89  Pre FEV1/FVC % % 72  Post FEV1/FCV % % 74  FEV1-Pre L 1.82  FEV1-Predicted Pre % 77  FEV1-Post L 2.02  DLCO uncorrected ml/min/mmHg 18.86  DLCO UNC% % 82  DLVA Predicted % 93  TLC L 8.18  TLC % Predicted % 166  RV % Predicted % 276     ROS - per HPI   CT chest 08/02/2020  CLINICAL DATA:  69 year old asymptomatic female former smoker with 66 pack-year smoking history, quit smoking 6 years prior.  EXAM: CT CHEST WITHOUT CONTRAST LOW-DOSE FOR LUNG CANCER SCREENING  TECHNIQUE: Multidetector CT imaging of the chest was performed following the standard protocol without IV contrast.  COMPARISON:  07/15/2019 screening chest CT.  FINDINGS: Cardiovascular: Normal heart size. No significant pericardial effusion/thickening. Three-vessel coronary atherosclerosis. Atherosclerotic nonaneurysmal thoracic aorta. Normal caliber pulmonary arteries.  Mediastinum/Nodes: No discrete thyroid nodules. Unremarkable esophagus. No pathologically enlarged axillary, mediastinal or hilar lymph nodes, noting limited sensitivity for the detection of hilar adenopathy on this noncontrast study.  Lungs/Pleura: No pneumothorax. No pleural effusion. Mild centrilobular emphysema with mild diffuse bronchial wall thickening. No acute consolidative airspace disease or lung masses. No significant pulmonary nodules. Nonspecific mild patchy subpleural reticulation and ground-glass opacity in the lower lungs bilaterally, slightly increased from prior.  Upper abdomen: Diffuse hepatic steatosis.  Musculoskeletal: No aggressive appearing focal osseous lesions. Marked thoracic spondylosis. Subcutaneous loop recorder in the medial ventral lower left chest wall.  IMPRESSION: 1. Lung-RADS 1, negative. Continue annual screening with low-dose chest CT without contrast in 12 months. 2. Nonspecific patchy subpleural reticulation and ground-glass opacity in the lower lungs  bilaterally, slightly increased. If there is clinical concern for a developing interstitial lung disease, pulmonology consultation and high-resolution chest CT may be obtained for further evaluation. 3. Three-vessel coronary atherosclerosis. 4. Diffuse hepatic steatosis. 5. Aortic Atherosclerosis (ICD10-I70.0) and Emphysema (ICD10-J43.9).   Electronically Signed   By: Ilona Sorrel M.D.   On: 08/02/2020 16:08      has a past medical history of Allergies, Anxiety, Asthma, COPD (chronic obstructive pulmonary disease) ([place]), Coronary artery calcification seen on CT scan, Depression, Fatigue, Former tobacco use, Hyperlipidemia, Paroxysmal atrial fibrillation ([place]), Pre-diabetes, RBBB (right bundle branch block with left anterior fascicular block), Scoliosis, Serum calcium elevated, and Thyroid disease.   reports that she quit smoking about 6 years ago. Her smoking use included cigarettes. She has a 70.00 pack-year smoking history. She has never used smokeless tobacco.  Past Surgical History:  Procedure Laterality Date  . APPENDECTOMY    . BREAST EXCISIONAL BIOPSY Right 2017   benign  . implantable loop recorder placement  11/14/2019   Medtronic Reveal Linq model LNQ 2 (SN [id number]) implantable loop recorder   . LAPROSCOPIC  X 2 IN EARLY 30-40'S  . OVARIAN CYST REMOVAL    . RADIOACTIVE SEED GUIDED EXCISIONAL BREAST BIOPSY Right 05/15/2016   Procedure: RIGHT RADIOACTIVE SEED GUIDED EXCISIONAL BREAST BIOPSY;  Surgeon: Rolm Bookbinder, MD;  Location: [place];  Service: General;  Laterality: Right;  RIGHT RADIOACTIVE SEED GUIDED EXCISIONAL BREAST BIOPSY  . TONSILECTOMY, ADENOIDECTOMY, BILATERAL MYRINGOTOMY AND TUBES      Allergies  Allergen Reactions  . Lipitor [Atorvastatin]     Myalgias   . Tetanus Toxoids Itching    Immunization History  Administered Date(s) Administered  . Fluad Quad(high Dose 65+) 07/18/2019  . Influenza, High Dose Seasonal PF  08/10/2017  . Influenza,inj,Quad PF,6+ Mos 07/06/2018  . Influenza-Unspecified 08/10/2017, 07/06/2018, 07/18/2019, 08/07/2020  . PFIZER SARS-COV-2 Vaccination 11/27/2019, 12/21/2019, 08/10/2020  . PPD Test 01/26/2014  . Pneumococcal Conjugate-13 05/06/2017  . Pneumococcal Polysaccharide-23 08/19/2018  . Pneumococcal-Unspecified 12/25/2011  . Td 07/22/2006  . Tdap 05/02/2016  . Zoster 01/26/2014  . Zoster Recombinat (Shingrix) 02/23/2019, 05/25/2019    Family History  Problem Relation Age of Onset  . Heart failure Mother   . Hypertension Mother   . Hyperlipidemia Mother   . Heart disease Mother   . Liver disease Father   . Alcohol abuse Father   . Colon cancer Neg Hx   . Esophageal cancer Neg Hx   . Stomach cancer Neg Hx      Current Outpatient Medications:  .  albuterol (PROVENTIL HFA;VENTOLIN HFA) 108 (90 Base) MCG/ACT inhaler, Use 2 inhalations 15 minutes apart every 4 hours to rescue Asthma, Disp: 3 Inhaler, Rfl: 3 .  azelastine (ASTELIN) 0.1 % nasal spray, SPRAY 2 SPRAYS INTO EACH NOSTRIL TWICE A DAY AS DIRECTED, Disp: 90 mL, Rfl: 3 .  buPROPion (WELLBUTRIN XL) 300 MG 24 hr tablet, Take 1 tablet every Morning or Mood, Focus & Concentration, Disp: 90 tablet, Rfl: 1 .  cetirizine (ZYRTEC) 10 MG tablet, Take 10 mg by mouth daily., Disp: , Rfl:  .  cholecalciferol (VITAMIN D3) 25 MCG (1000 UNIT) tablet, Take 1,000 Units by mouth daily. , Disp: , Rfl:  .  citalopram (CELEXA) 40 MG tablet, Take 1 tablet Daily for Mood, Disp: 90 tablet, Rfl: 0 .  cyclobenzaprine (FLEXERIL) 10 MG tablet, Take 10 mg by mouth at bedtime as needed. , Disp: , Rfl:  .  diltiazem (CARDIZEM CD) 240 MG 24 hr capsule, Take 1 capsule (240 mg total) by mouth daily., Disp: 90 capsule, Rfl: 2 .  diltiazem (CARDIZEM) 30 MG tablet, Take 1 tablet Daily for  rapid Heart Beat, Disp: 90 tablet, Rfl: 1 .  ezetimibe (ZETIA) 10 MG tablet, TAKE 1 TABLET BY MOUTH EVERY DAY, Disp: 90 tablet, Rfl: 1 .  fluticasone (FLONASE)  50 MCG/ACT nasal spray, PLACE 1 SPRAY INTO EACH NOSTRIL ONCE DAILY, Disp: 48 g, Rfl: 3 .  ipratropium (ATROVENT) 0.06 % nasal spray, Place 2 sprays into both nostrils 2 (two) times daily., Disp: , Rfl:  .  magnesium oxide (MAG-OX) 400 MG tablet, Take 400 mg by mouth daily., Disp: , Rfl:  .  montelukast (SINGULAIR) 10 MG tablet, Take 10 mg by mouth at bedtime. , Disp: , Rfl:  .  Omega-3 Fatty Acids (FISH OIL) 1000 MG CAPS, Take 1 capsule by mouth., Disp: , Rfl:  .  OVER THE COUNTER MEDICATION, Eye drop- 1 drop each eye as needed. Equate dry eye, Disp: , Rfl:  .  rivaroxaban (XARELTO) 20 MG TABS tablet, Take 1 tablet Daily to Prevent  Blood Clots, Disp: 90 tablet, Rfl: 0 .  rosuvastatin (CRESTOR) 5 MG tablet, , Disp: , Rfl:  .  valACYclovir (VALTREX) 500 MG tablet, Take 1 tablet Daily for Prevention Fever Blisters, Disp: 90 tablet, Rfl: 3 .  WIXELA INHUB 250-50 MCG/DOSE AEPB, INHALE 1 PUFF INTO THE LUNGS TWICE A DAY**RINSE MOUTH AFTER INHALATION, Disp: 180 each, Rfl: 1      Objective:   Vitals:   09/04/20 1417  BP: 120/72  Pulse: 60  Temp: 98.5 F (36.9 C)  TempSrc: Oral  SpO2: 98%  Weight: 153 lb (69.4 kg)  Height: 5\' 3"  (1.6 m)    Estimated body mass index is 27.1 kg/m as calculated from the following:   Height as of this encounter: 5\' 3"  (1.6 m).   Weight as of this encounter: 153 lb (69.4 kg).  @WEIGHTCHANGE @  [employer]   09/04/20 1417  Weight: 153 lb (69.4 kg)     Physical Exam  General Appearance:    Alert, cooperative, no distress, appears stated age - yes , Deconditioned looking - no , OBESE  - no, Sitting on Wheelchair -  no  Head:    Normocephalic, without obvious abnormality, atraumatic  Eyes:    PERRL, conjunctiva/corneas clear,  Ears:    Normal TM's and external ear canals, both ears  Nose:   Nares normal, septum midline, mucosa normal, no drainage    or sinus tenderness. OXYGEN ON  - no . Patient is @ ra   Throat:   Lips, mucosa, and tongue normal; teeth  and gums normal. Cyanosis on lips - no  Neck:   Supple, symmetrical, trachea midline, no adenopathy;    thyroid:  no enlargement/tenderness/nodules; no carotid   bruit or JVD  Back:     Symmetric, no curvature, ROM normal, no CVA tenderness  Lungs:     Distress - no , Wheeze no, Barrell Chest - no, Purse lip breathing - no, Crackles - no   Chest Wall:    No tenderness or deformity.    Heart:    Regular rate and rhythm, S1 and S2 normal, no rub   or gallop, Murmur - no  Breast Exam:    NOT DONE  Abdomen:     Soft, non-tender, bowel sounds active all four quadrants,    no masses, no organomegaly. Visceral obesity - no  Genitalia:   NOT DONE  Rectal:   NOT DONE  Extremities:   Extremities - normal, Has Cane - no, Clubbing - no, Edema - no  Pulses:   2+ and symmetric all extremities  Skin:   Stigmata of Connective Tissue Disease - no  Lymph nodes:   Cervical, supraclavicular, and axillary nodes normal  Psychiatric:  Neurologic:   Pleasant - yes, Anxious - no, Flat affect - no  CAm-ICU - neg, Alert and Oriented x 3 - yes, Moves all 4s - yes, Speech - normal, Cognition - intact         Assessment:       ICD-10-CM   1. Dyspnea on exertion  R06.00   2. Chronic cough  R05.3   3. Stopped smoking with greater than 40 pack year history  Z87.891   4. Other chronic sinusitis  J32.8   5. History of asthma  Z87.09    She appears to have some interstitial lung abnormalities [ILA] on the CT scan but it is a low-dose CT scan.  This can be in part or in full related to her symptoms although  I think her symptoms especially cough are from other etiologies.  At this point in time we need to resolve whether she has true interstitial lung abnormalities or not.  Therefore it is best to get a high-resolution CT chest.  Anyway it has been more than 5 weeks since she had a most recent CT chest.  She is agreeable to this plan.  Then based on the degree of interstitial lung abnormalities and the nature of it we  can decide the next course of action  Shortness of breath: Might be tied to this but other reasons including asthma.  Therefore we will check current status of her asthma with CBC with differential blood IgE and also pulmonary function test including exhaled nitric oxide testing  Cough: Could be related to the above issues but also I think because of her severe clearing of the throat she might have cough neuropathy.  This can be addressed in the future  Chronic sinusitis with nasal blockage: Last CT scan 2018.  She is willing to have another CT scan we will get this      Plan:     Patient Instructions     ICD-10-CM   1. Dyspnea on exertion  R06.00   2. Chronic cough  R05.3   3. Stopped smoking with greater than 40 pack year history  Z87.891   4. Other chronic sinusitis  J32.8   5. History of asthma  Z87.09     Get CT sinus without contrast Get HRCT chest supine and prone Get blood cbc with diff, IgE, ANA, DS-DNA, RF, CCP, ANCA, PR-3 and MPO Get full PFT Get FeNO breathing test  [place]op[place]up  -regroup after above with APP or DR [place] Caller - 30 min slot     SIGNATURE    Dr. Brand Males, M.D., F.C.C.P,  Pulmonary and Critical Care Medicine Staff Physician, [place] Director - Interstitial Lung Disease  Program  P[facility] [place] [facility] [place], [place], [address]  Pager: [phone], If no answer or between  15:00h - 7:00h: call [phone] Telephone: [phone]  2:51 PM 09/04/2020

## 2020-09-04 NOTE — Addendum Note (Signed)
Addended by: Suzzanne Cloud E on: 09/04/2020 03:01 PM   Modules accepted: Orders

## 2020-09-06 LAB — IGE: IgE (Immunoglobulin E), Serum: 20 kU/L (ref <=114)

## 2020-09-06 LAB — ANA+ENA+DNA/DS+ANTICH+CENTR
ANA Titer 1: NEGATIVE
Anti JO-1: <0.2 AI (ref 0.0–0.9)
Centromere Ab Screen: <0.2 AI (ref 0.0–0.9)
Chromatin Ab SerPl-aCnc: <0.2 AI (ref 0.0–0.9)
ENA RNP Ab: <0.2 AI (ref 0.0–0.9)
ENA SM Ab Ser-aCnc: <0.2 AI (ref 0.0–0.9)
ENA SSA (RO) Ab: <0.2 AI (ref 0.0–0.9)
ENA SSB (LA) Ab: <0.2 AI (ref 0.0–0.9)
Scleroderma (Scl-70) (ENA) Antibody, IgG: <0.2 AI (ref 0.0–0.9)
dsDNA Ab: <1 IU/mL (ref 0–9)

## 2020-09-06 LAB — ANA: Anti Nuclear Antibody (ANA): POSITIVE — AB

## 2020-09-06 LAB — ANCA SCREEN W REFLEX TITER: ANCA Screen: NEGATIVE

## 2020-09-06 LAB — CYCLIC CITRUL PEPTIDE ANTIBODY, IGG: Cyclic Citrullin Peptide Ab: <16 UNITS

## 2020-09-06 LAB — MPO/PR-3 (ANCA) ANTIBODIES
Myeloperoxidase Abs: <1 AI
Serine Protease 3: <1 AI

## 2020-09-06 LAB — ANTI-NUCLEAR AB-TITER (ANA TITER): ANA Titer 1: 1:80 {titer} — ABNORMAL HIGH

## 2020-09-06 LAB — RHEUMATOID FACTOR: Rheumatoid fact SerPl-aCnc: 33 IU/mL — ABNORMAL HIGH (ref <14)

## 2020-09-10 ENCOUNTER — Ambulatory Visit (INDEPENDENT_AMBULATORY_CARE_PROVIDER_SITE_OTHER): Payer: Medicare PPO

## 2020-09-10 ENCOUNTER — Other Ambulatory Visit: Payer: Self-pay | Admitting: Internal Medicine

## 2020-09-10 DIAGNOSIS — I480 Paroxysmal atrial fibrillation: Secondary | ICD-10-CM

## 2020-09-12 ENCOUNTER — Ambulatory Visit (INDEPENDENT_AMBULATORY_CARE_PROVIDER_SITE_OTHER)
Admission: RE | Admit: 2020-09-12 | Discharge: 2020-09-12 | Disposition: A | Discharge status: Home / Self Care | Payer: Medicare PPO | Source: Ambulatory Visit | Attending: Internal Medicine | Admitting: Internal Medicine

## 2020-09-12 ENCOUNTER — Other Ambulatory Visit: Payer: Self-pay

## 2020-09-12 DIAGNOSIS — R918 Other nonspecific abnormal finding of lung field: Secondary | ICD-10-CM | POA: Diagnosis not present

## 2020-09-12 DIAGNOSIS — J328 Other chronic sinusitis: Secondary | ICD-10-CM

## 2020-09-12 DIAGNOSIS — J849 Interstitial pulmonary disease, unspecified: Secondary | ICD-10-CM | POA: Diagnosis not present

## 2020-09-12 DIAGNOSIS — R053 Chronic cough: Secondary | ICD-10-CM | POA: Diagnosis not present

## 2020-09-12 LAB — CUP PACEART REMOTE DEVICE CHECK
Date Time Interrogation Session: 20211206092909
Implantable Pulse Generator Implant Date: 20210208

## 2020-09-13 DIAGNOSIS — G4733 Obstructive sleep apnea (adult) (pediatric): Secondary | ICD-10-CM | POA: Diagnosis not present

## 2020-09-19 NOTE — Progress Notes (Signed)
Carelink Summary Report / Loop Recorder 

## 2020-09-24 DIAGNOSIS — M810 Age-related osteoporosis without current pathological fracture: Secondary | ICD-10-CM | POA: Diagnosis not present

## 2020-10-09 DIAGNOSIS — I2510 Atherosclerotic heart disease of native coronary artery without angina pectoris: Secondary | ICD-10-CM | POA: Diagnosis not present

## 2020-10-11 DIAGNOSIS — I2510 Atherosclerotic heart disease of native coronary artery without angina pectoris: Secondary | ICD-10-CM | POA: Diagnosis not present

## 2020-10-11 DIAGNOSIS — E7800 Pure hypercholesterolemia, unspecified: Secondary | ICD-10-CM | POA: Diagnosis not present

## 2020-10-11 DIAGNOSIS — M810 Age-related osteoporosis without current pathological fracture: Secondary | ICD-10-CM | POA: Diagnosis not present

## 2020-10-11 DIAGNOSIS — E1165 Type 2 diabetes mellitus with hyperglycemia: Secondary | ICD-10-CM | POA: Diagnosis not present

## 2020-10-14 DIAGNOSIS — G4733 Obstructive sleep apnea (adult) (pediatric): Secondary | ICD-10-CM | POA: Diagnosis not present

## 2020-10-15 ENCOUNTER — Ambulatory Visit (INDEPENDENT_AMBULATORY_CARE_PROVIDER_SITE_OTHER): Payer: Medicare PPO

## 2020-10-15 DIAGNOSIS — I480 Paroxysmal atrial fibrillation: Secondary | ICD-10-CM | POA: Diagnosis not present

## 2020-10-15 LAB — CUP PACEART REMOTE DEVICE CHECK
Date Time Interrogation Session: 20220110122559
Implantable Pulse Generator Implant Date: 20210208

## 2020-10-18 ENCOUNTER — Ambulatory Visit (HOSPITAL_COMMUNITY)
Admission: RE | Admit: 2020-10-18 | Discharge: 2020-10-18 | Disposition: A | Discharge status: Home / Self Care | Payer: Medicare PPO | Source: Ambulatory Visit | Attending: Nurse Practitioner | Admitting: Nurse Practitioner

## 2020-10-18 ENCOUNTER — Other Ambulatory Visit: Payer: Self-pay

## 2020-10-18 ENCOUNTER — Encounter (HOSPITAL_COMMUNITY): Payer: Self-pay | Admitting: Nurse Practitioner

## 2020-10-18 VITALS — BP 138/70 | HR 56 | Ht 63.0 in | Wt 152.8 lb

## 2020-10-18 DIAGNOSIS — I444 Left anterior fascicular block: Secondary | ICD-10-CM | POA: Insufficient documentation

## 2020-10-18 DIAGNOSIS — I452 Bifascicular block: Secondary | ICD-10-CM | POA: Diagnosis not present

## 2020-10-18 DIAGNOSIS — Z87891 Personal history of nicotine dependence: Secondary | ICD-10-CM | POA: Insufficient documentation

## 2020-10-18 DIAGNOSIS — Z79899 Other long term (current) drug therapy: Secondary | ICD-10-CM | POA: Insufficient documentation

## 2020-10-18 DIAGNOSIS — Z7901 Long term (current) use of anticoagulants: Secondary | ICD-10-CM | POA: Insufficient documentation

## 2020-10-18 DIAGNOSIS — J449 Chronic obstructive pulmonary disease, unspecified: Secondary | ICD-10-CM | POA: Diagnosis not present

## 2020-10-18 DIAGNOSIS — E785 Hyperlipidemia, unspecified: Secondary | ICD-10-CM | POA: Insufficient documentation

## 2020-10-18 DIAGNOSIS — D6869 Other thrombophilia: Secondary | ICD-10-CM

## 2020-10-18 DIAGNOSIS — I480 Paroxysmal atrial fibrillation: Secondary | ICD-10-CM | POA: Insufficient documentation

## 2020-10-18 DIAGNOSIS — I4510 Unspecified right bundle-branch block: Secondary | ICD-10-CM | POA: Diagnosis not present

## 2020-10-18 NOTE — Progress Notes (Addendum)
Primary Care Physician: Deland Pretty, MD Referring Physician:Device clinic EP: Dr. Malva Cogan [name] is a 71 y.o. Booker with a h/o COPD, prior tobacco use, paroxysmal afib, 3 vessel CAD by chest CT( done for CA screening), RBBB, OSA treated with cpap, who is in the afib clinic for increase of symptomatic afib. She is in SR today. 30 mg Cardizem helps to blunt the episodes.  Usually over in  2 hours.   She reports that she is using CPAP, no significant caffeine or alcohol. She did use Multaq in the past, stopped this drug as it made her hair fall out.  She has noted increase of afib over the last couple of weeks. She had been offered ablation  by Dr. Rayann Heman in the past but at the time was not ready.   She has noted increase of exertional dyspnea with activities over the last several months. This is when she is in SR. Denies any chest discomfort. She has had 3 vessel CAD noted on her Chest Ct's since 2019. She is on xarelto 20 mg daily for a CHA2DS2VASc score of 3.   F/u in afib clinic, 10/18/20. She saw Dr. Rayann Heman in [place] 2021,  and he discussed options for her afib including ablation, but she feels that her afib burden is still low and wants to wait By last paceart report, burden is  around 5%.  Today, she denies symptoms of palpitations, chest pain, shortness of breath, orthopnea, PND, lower extremity edema, dizziness, presyncope, syncope, or neurologic sequela. The patient is tolerating medications without difficulties and is otherwise without complaint today.   Past Medical History:  Diagnosis Date  . Allergies   . Anxiety   . Asthma   . COPD (chronic obstructive pulmonary disease) ([place])   . Coronary artery calcification seen on CT scan   . Depression   . Fatigue   . Former tobacco use   . Hyperlipidemia   . Paroxysmal atrial fibrillation ([facility])   . Pre-diabetes   . RBBB (right bundle branch block with left anterior fascicular block)   . Scoliosis   . Serum calcium  elevated   . Thyroid disease    "questionable" per patient   Past Surgical History:  Procedure Laterality Date  . APPENDECTOMY    . BREAST EXCISIONAL BIOPSY Right 2017   benign  . implantable loop recorder placement  11/14/2019   Medtronic Reveal Linq model LNQ 2 (SN [id number]) implantable loop recorder   . LAPROSCOPIC     X 2 IN EARLY 30-40'S  . OVARIAN CYST REMOVAL    . RADIOACTIVE SEED GUIDED EXCISIONAL BREAST BIOPSY Right 05/15/2016   Procedure: RIGHT RADIOACTIVE SEED GUIDED EXCISIONAL BREAST BIOPSY;  Surgeon: Rolm Bookbinder, MD;  Location: [facility];  Service: General;  Laterality: Right;  RIGHT RADIOACTIVE SEED GUIDED EXCISIONAL BREAST BIOPSY  . TONSILECTOMY, ADENOIDECTOMY, BILATERAL MYRINGOTOMY AND TUBES      Current Outpatient Medications  Medication Sig Dispense Refill  . albuterol (PROVENTIL HFA;VENTOLIN HFA) 108 (90 Base) MCG/ACT inhaler Use 2 inhalations 15 minutes apart every 4 hours to rescue Asthma 3 Inhaler 3  . azelastine (ASTELIN) 0.1 % nasal spray SPRAY 2 SPRAYS INTO EACH NOSTRIL TWICE A DAY AS DIRECTED 90 mL 3  . buPROPion (WELLBUTRIN XL) 300 MG 24 hr tablet Take 1 tablet every Morning or Mood, Focus & Concentration 90 tablet 1  . cetirizine (ZYRTEC) 10 MG tablet Take 10 mg by mouth daily.    . cholecalciferol (VITAMIN  D3) 25 MCG (1000 UNIT) tablet Take 1,000 Units by mouth daily.     . citalopram (CELEXA) 20 MG tablet Take 20 mg by mouth daily.    . cyclobenzaprine (FLEXERIL) 10 MG tablet Take 10 mg by mouth at bedtime as needed.     . diltiazem (CARDIZEM CD) 240 MG 24 hr capsule Take 1 capsule (240 mg total) by mouth daily. 90 capsule 2  . diltiazem (CARDIZEM) 30 MG tablet Take 1 tablet Daily for  rapid Heart Beat (Patient taking differently: Take 1 tablet Daily for  rapid Heart Beat- only if HR is above 100 and top number b/p needs to be above 100) 90 tablet 1  . ezetimibe (ZETIA) 10 MG tablet TAKE 1 TABLET BY MOUTH EVERY DAY 90 tablet 1  .  fluticasone (FLONASE) 50 MCG/ACT nasal spray PLACE 1 SPRAY INTO EACH NOSTRIL ONCE DAILY 48 g 3  . ipratropium (ATROVENT) 0.06 % nasal spray Place 2 sprays into both nostrils 2 (two) times daily.    . magnesium oxide (MAG-OX) 400 MG tablet Take 400 mg by mouth daily.    . montelukast (SINGULAIR) 10 MG tablet Take 10 mg by mouth at bedtime.     . Omega-3 Fatty Acids (FISH OIL) 1000 MG CAPS Take 1 capsule by mouth.    Kathryn Booker OVER THE COUNTER MEDICATION Eye drop- 1 drop each eye as needed. Equate dry eye    . rivaroxaban (XARELTO) 20 MG TABS tablet Take      1 tablet       Daily       to Prevent Blood Clots 90 tablet 0  . rosuvastatin (CRESTOR) 5 MG tablet Take 5 mg by mouth daily.    . valACYclovir (VALTREX) 500 MG tablet Take 1 tablet Daily for Prevention Fever Blisters 90 tablet 3  . WIXELA INHUB 250-50 MCG/DOSE AEPB INHALE 1 PUFF INTO THE LUNGS TWICE A DAY**RINSE MOUTH AFTER INHALATION 180 each 1   No current facility-administered medications for this encounter.    Allergies  Allergen Reactions  . Atorvastatin Other (See Comments)    Myalgias   . Tetanus Toxoids Itching  . Tetanus-Diphtheria Toxoids Td Other (See Comments)    Social History   Socioeconomic History  . Marital status: Divorced    Spouse name: Not on file  . Number of children: Not on file  . Years of education: Not on file  . Highest education level: Not on file  Occupational History  . Not on file  Tobacco Use  . Smoking status: Former Smoker    Packs/day: 2.00    Years: 35.00    Pack years: 70.00    Types: Cigarettes    Quit date: 11/19/2013    Years since quitting: 6.9  . Smokeless tobacco: Never Used  Vaping Use  . Vaping Use: Never used  Substance and Sexual Activity  . Alcohol use: Yes    Alcohol/week: 0.0 standard drinks    Comment: social  . Drug use: No  . Sexual activity: Not on file  Other Topics Concern  . Not on file  Social History Narrative   Divorced   No family   Works at [employer] more   Smokes 1.5 ppd   Sedentary   Stress in life   Social Determinants of Health   Financial Resource Strain: Not on file  Food Insecurity: Not on file  Transportation Needs: Not on file  Physical Activity: Not on file  Stress: Not on  file  Social Connections: Not on file  Intimate Partner Violence: Not on file    Family History  Problem Relation Age of Onset  . Heart failure Mother   . Hypertension Mother   . Hyperlipidemia Mother   . Heart disease Mother   . Liver disease Father   . Alcohol abuse Father   . Colon cancer Neg Hx   . Esophageal cancer Neg Hx   . Stomach cancer Neg Hx     ROS- All systems are reviewed and negative except as per the HPI above  Physical Exam: Vitals:   10/18/20 1107  BP: 138/70  Pulse: (!) 56  Weight: 69.3 kg  Height: 5\' 3"  (1.6 m)   Wt Readings from Last 3 Encounters:  10/18/20 69.3 kg  09/04/20 69.4 kg  07/09/20 69 kg    Labs: Lab Results  Component Value Date   NA 137 08/25/2018   K 4.9 08/25/2018   CL 103 08/25/2018   CO2 24 08/25/2018   GLUCOSE 107 (H) 08/25/2018   BUN 19 08/25/2018   CREATININE 0.83 08/25/2018   CALCIUM 11.0 (H) 08/25/2018   PHOS 2.9 08/26/2018   MG 2.1 08/26/2018   No results found for: INR Lab Results  Component Value Date   CHOL 227 (H) 05/27/2018   HDL 60 05/27/2018   LDLCALC 137 (H) 05/27/2018   TRIG 160 (H) 05/27/2018     GEN- The patient is well appearing, alert and oriented x 3 today.   Head- normocephalic, atraumatic Eyes-  Sclera clear, conjunctiva pink Ears- hearing intact Oropharynx- clear Neck- supple, no JVP Lymph- no cervical lymphadenopathy Lungs- Clear to ausculation bilaterally, normal work of breathing Heart- Regular rate and rhythm, no murmurs, rubs or gallops, PMI not laterally displaced GI- soft, NT, ND, + BS Extremities- no clubbing, cyanosis, or edema MS- no significant deformity or atrophy Skin- no rash or lesion Psych- euthymic  mood, full affect Neuro- strength and sensation are intact  EKG-SR  At 63 bpm, pr int 170 ms, qrs int 82 ms, qtc 458 ms  Paceart report reviewed   Assessment and Plan: 1. Paroxsymal afib Took multaq in past stopped for hair loss  Continue Cardizem 240 mg daily Cardizem  30 mg as needed for breakthrough episodes   Continue  paceart Will request last bmet from PCP , she is due for physical labs in March, last CBC one month ago, normal  2. CHA2DS2VASc score of 3 Continue xarelto 20 mg daily  F/u with Dr. Rayann Heman per recall   Addendum- 10/26/19 bmet received from PCP drawn 10/10/19 shows Creatinine at 0.9/ BUN 19, cr cl cal at 65.12,  continue on correct dose of xarelto 20 mg daily    Lucindy Borel C. Joriel Streety, [facility] [address] [place], [place] [address] [phone]

## 2020-10-22 NOTE — Progress Notes (Signed)
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 -> can explain dyspnea. Has aoritic calcification and coronary artery calcification -> has had normal stress test. CT evidence of early liver cirrhosis + => will discuss at followup in feb

## 2020-10-22 NOTE — Progress Notes (Signed)
Normal CT sinus. Will discuss feb 2022 visit

## 2020-10-25 NOTE — Addendum Note (Signed)
Encounter addended by: Sherran Needs, NP on: 10/25/2020 12:04 PM  Actions taken: Clinical Note Signed

## 2020-10-29 NOTE — Progress Notes (Signed)
Carelink Summary Report / Loop Recorder 

## 2020-11-02 ENCOUNTER — Other Ambulatory Visit (HOSPITAL_COMMUNITY)
Admission: RE | Admit: 2020-11-02 | Discharge: 2020-11-02 | Disposition: A | Discharge status: Home / Self Care | Payer: Medicare PPO | Source: Ambulatory Visit | Attending: Internal Medicine | Admitting: Internal Medicine

## 2020-11-02 DIAGNOSIS — Z20822 Contact with and (suspected) exposure to covid-19: Secondary | ICD-10-CM | POA: Insufficient documentation

## 2020-11-02 DIAGNOSIS — Z01812 Encounter for preprocedural laboratory examination: Secondary | ICD-10-CM | POA: Diagnosis not present

## 2020-11-02 LAB — SARS CORONAVIRUS 2 (TAT 6-24 HRS): SARS Coronavirus 2: NEGATIVE

## 2020-11-05 ENCOUNTER — Other Ambulatory Visit: Payer: Self-pay | Admitting: Internal Medicine

## 2020-11-05 DIAGNOSIS — R0600 Dyspnea, unspecified: Secondary | ICD-10-CM

## 2020-11-05 DIAGNOSIS — R0609 Other forms of dyspnea: Secondary | ICD-10-CM

## 2020-11-06 ENCOUNTER — Other Ambulatory Visit: Payer: Self-pay

## 2020-11-06 ENCOUNTER — Encounter: Payer: Self-pay | Admitting: Internal Medicine

## 2020-11-06 ENCOUNTER — Ambulatory Visit (INDEPENDENT_AMBULATORY_CARE_PROVIDER_SITE_OTHER): Payer: Medicare PPO | Admitting: Internal Medicine

## 2020-11-06 ENCOUNTER — Ambulatory Visit: Payer: Medicare PPO | Admitting: Internal Medicine

## 2020-11-06 VITALS — BP 114/76 | HR 58 | Temp 97.6°F | Ht 63.0 in | Wt 152.0 lb

## 2020-11-06 DIAGNOSIS — Z8709 Personal history of other diseases of the respiratory system: Secondary | ICD-10-CM

## 2020-11-06 DIAGNOSIS — K760 Fatty (change of) liver, not elsewhere classified: Secondary | ICD-10-CM | POA: Diagnosis not present

## 2020-11-06 DIAGNOSIS — R768 Other specified abnormal immunological findings in serum: Secondary | ICD-10-CM | POA: Diagnosis not present

## 2020-11-06 DIAGNOSIS — Z87891 Personal history of nicotine dependence: Secondary | ICD-10-CM

## 2020-11-06 DIAGNOSIS — R053 Chronic cough: Secondary | ICD-10-CM

## 2020-11-06 DIAGNOSIS — J849 Interstitial pulmonary disease, unspecified: Secondary | ICD-10-CM

## 2020-11-06 DIAGNOSIS — J387 Other diseases of larynx: Secondary | ICD-10-CM

## 2020-11-06 DIAGNOSIS — J398 Other specified diseases of upper respiratory tract: Secondary | ICD-10-CM | POA: Diagnosis not present

## 2020-11-06 DIAGNOSIS — R0600 Dyspnea, unspecified: Secondary | ICD-10-CM

## 2020-11-06 DIAGNOSIS — R0609 Other forms of dyspnea: Secondary | ICD-10-CM

## 2020-11-06 LAB — PULMONARY FUNCTION TEST
DL/VA % pred: 102 %
DL/VA: 4.28 ml/min/mmHg/L
DLCO cor % pred: 95 %
DLCO cor: 18.19 ml/min/mmHg
DLCO unc % pred: 95 %
DLCO unc: 18.19 ml/min/mmHg
FEF 25-75 Post: 1.24 L/sec
FEF 25-75 Pre: 1.18 L/sec
FEF2575-%Change-Post: 5 %
FEF2575-%Pred-Post: 64 %
FEF2575-%Pred-Pre: 60 %
FEV1-%Change-Post: 1 %
FEV1-%Pred-Post: 78 %
FEV1-%Pred-Pre: 77 %
FEV1-Post: 1.75 L
FEV1-Pre: 1.73 L
FEV1FVC-%Change-Post: -1 %
FEV1FVC-%Pred-Pre: 94 %
FEV6-%Change-Post: 2 %
FEV6-%Pred-Post: 86 %
FEV6-%Pred-Pre: 84 %
FEV6-Post: 2.44 L
FEV6-Pre: 2.38 L
FEV6FVC-%Change-Post: 0 %
FEV6FVC-%Pred-Post: 104 %
FEV6FVC-%Pred-Pre: 104 %
FVC-%Change-Post: 2 %
FVC-%Pred-Post: 83 %
FVC-%Pred-Pre: 81 %
FVC-Post: 2.45 L
FVC-Pre: 2.39 L
Post FEV1/FVC ratio: 71 %
Post FEV6/FVC ratio: 100 %
Pre FEV1/FVC ratio: 72 %
Pre FEV6/FVC Ratio: 100 %
RV % pred: 111 %
RV: 2.34 L
TLC % pred: 100 %
TLC: 4.94 L

## 2020-11-06 LAB — NITRIC OXIDE: Nitric Oxide: 13

## 2020-11-06 NOTE — Progress Notes (Signed)
PFT done today. 

## 2020-11-06 NOTE — Patient Instructions (Addendum)
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 - Your symptoms of shortness of breath and cough are complex interplay of multiple different factors  Plan -Refer to pulmonary rehabilitation -Referred to voice rehabilitation/neuro rehabilitation Kathryn Booker -Choose 2 consecutive days to observe complete voice rest -Drink enough water when you feel the urge to cough -Suck on sugarless lozenge when he has the urge to cough -If these measures do not work for the cough will consider starting gabapentin [at this point in time because you are on Celexa and Wellbutrin we are going to hold off -Cut down your Wixela to once daily -> we will slowly try to taper off inhaled steroid on the assumption that your asthma is outgrown -We will hold off on any referral to [facility] interventional pulmonology for stent placement but use this approach as the last resort  Fatty liver  -Noticed on CT scan of the chest December 2021  Plan -Please talk to primary care physician about this  Positive ANA (antinuclear antibody) Rheumatoid factor positive  -Noticed on blood work December 2021.  There is no evidence of systemic connective tissue disease.  Suspect this is trace positive in part due to aging process  Plan -Expectant follow-up   Follow-up 3-4 months with Kathryn Booker in a routine 15-minute slot

## 2020-11-06 NOTE — Progress Notes (Signed)
OV Feb 2018  Synopsis: First evaluated in December 2017 by [facility] pulmonary after heavy smoking history through 2015. She has a history of childhood asthma and has an adult took immunotherapy for 20 years.   HPI Chief Complaint  Patient presents with  . Follow-up    pt c/o pnd, prod cough.    Kathryn Booker says that her symptoms have been worsening lately. She had flu in January. Overall she's not improved.  Cough: > form a post nasal drip all the time > worse when she had influenza A in January  Influenza A in January > diagnosed in January > three rounds of antibiotics > guaifenesin helped  Asthma: > added [place], didn't help with her chest congestion and cough > she says that she can feel mucus in her chest, she just can't get it out  OV 09/04/2020  Subjective:  Patient ID: Kathryn Booker, female , DOB: [birth year] , age 31 y.o. , MRN: [id number] , ADDRESS: [address] [place] [id number] PCP Deland Pretty, MD Patient Care Team: Deland Pretty, MD as PCP - General (Internal Medicine) Sueanne Margarita, MD as PCP - Sleep Medicine (Cardiology) Josue Hector, MD as Consulting Physician (Cardiology) Inda Castle, MD (Inactive) as Consulting Physician (Gastroenterology) Lavonna Monarch, MD as Consulting Physician (Dermatology) Marygrace Drought, MD as Consulting Physician (Ophthalmology) Rolm Bookbinder, MD as Consulting Physician (General Surgery) Thompson Grayer, MD as Consulting Physician (Cardiology)  This Provider for this visit: Treatment Team:  Attending Provider: Brand Males, MD    09/04/2020 -   Chief Complaint  Patient presents with  . New Patient (Initial Visit)    Low dose CT screening follow up     HPI Kathryn Booker 70 y.o. -female former patient of Dr. Roselie Awkward.  Former 70 pack smoker last seen by Dr. [name] over 3 years ago.  Therefore this is a new visit.  She has a history of sleep apnea for which she uses CPAP.   She also has a history of allergic asthma in the past.  She remembers seeing Dr. Caprice Red and apparently her skin test was extremely positive.  She feels that still some asthma but essentially this is not a major issue at this point.  In the last few to several years has been dealing with chronic sinus drainage and postnasal drip and a sensation of blocked nose from deviated nasal septum.  She is frustrated by this.  2018 CT sinuses was clear.  This really bothers her.  In 2018 she had pulmonary function test documented below.  After that she was told that she was more like an asthma patient as opposed to a COPD patient according to history.  However most recently she has had insidious onset of shortness of breath that is progressively worse this past year.  Symptom scores are listed below.  She says in the context of this she had a routine low-dose CT scan of the chest because of a previous history of 70 pack smoking.  This showed new onset interstitial abnormalities particularly in the lung base.  Therefore she is worried and therefore she has been referred here to the ILD center.  She has significant amount of sinus drainage, shortness of breath as below and cough with constant clearing of the throat these are her main symptoms.  Her medication list is reviewed.    SYMPTOM SCALE - ILD 09/04/2020   O2 use ra  Shortness of Breath 0 -> 5 scale with  5 being worst (score 6 If unable to do)  At rest 0.25  Simple tasks - showers, clothes change, eating, shaving 3.5  Household (dishes, doing bed, laundry) 3.5  Shopping 0.5  Walking level at own pace 1  Walking up Stairs 3.5  Total (30-36) Dyspnea Score 12.25  How bad is your cough? 5  How bad is your fatigue 1  How bad is nausea 0  How bad is vomiting?  00  How bad is diarrhea? 0  How bad is anxiety? 0  How bad is depression 0         PFT Results Latest Ref Rng & Units 09/16/2016  FVC-Pre L 2.53  FVC-Predicted Pre % 82  FVC-Post L  2.74  FVC-Predicted Post % 89  Pre FEV1/FVC % % 72  Post FEV1/FCV % % 74  FEV1-Pre L 1.82  FEV1-Predicted Pre % 77  FEV1-Post L 2.02  DLCO uncorrected ml/min/mmHg 18.86  DLCO UNC% % 82  DLVA Predicted % 93  TLC L 8.18  TLC % Predicted % 166  RV % Predicted % 276     ROS - per HPI   CT chest 08/02/2020  CLINICAL DATA:  70 year old asymptomatic female former smoker with 66 pack-year smoking history, quit smoking 6 years prior.  EXAM: CT CHEST WITHOUT CONTRAST LOW-DOSE FOR LUNG CANCER SCREENING  TECHNIQUE: Multidetector CT imaging of the chest was performed following the standard protocol without IV contrast.  COMPARISON:  07/15/2019 screening chest CT.  FINDINGS: Cardiovascular: Normal heart size. No significant pericardial effusion/thickening. Three-vessel coronary atherosclerosis. Atherosclerotic nonaneurysmal thoracic aorta. Normal caliber pulmonary arteries.  Mediastinum/Nodes: No discrete thyroid nodules. Unremarkable esophagus. No pathologically enlarged axillary, mediastinal or hilar lymph nodes, noting limited sensitivity for the detection of hilar adenopathy on this noncontrast study.  Lungs/Pleura: No pneumothorax. No pleural effusion. Mild centrilobular emphysema with mild diffuse bronchial wall thickening. No acute consolidative airspace disease or lung masses. No significant pulmonary nodules. Nonspecific mild patchy subpleural reticulation and ground-glass opacity in the lower lungs bilaterally, slightly increased from prior.  Upper abdomen: Diffuse hepatic steatosis.  Musculoskeletal: No aggressive appearing focal osseous lesions. Marked thoracic spondylosis. Subcutaneous loop recorder in the medial ventral lower left chest wall.  IMPRESSION: 1. Lung-RADS 1, negative. Continue annual screening with low-dose chest CT without contrast in 12 months. 2. Nonspecific patchy subpleural reticulation and ground-glass opacity in the lower lungs  bilaterally, slightly increased. If there is clinical concern for a developing interstitial lung disease, pulmonology consultation and high-resolution chest CT may be obtained for further evaluation. 3. Three-vessel coronary atherosclerosis. 4. Diffuse hepatic steatosis. 5. Aortic Atherosclerosis (ICD10-I70.0) and Emphysema (ICD10-J43.9).   Electronically Signed   By: Ilona Sorrel M.D.   On: 08/02/2020 16  OV 11/06/2020  Subjective:  Patient ID: Kathryn Booker, female , DOB: Oct 26, 1950 , age 70 y.o. , MRN: [id number] , ADDRESS: [address] [place] [id number] PCP Deland Pretty, MD Patient Care Team: Deland Pretty, MD as PCP - General (Internal Medicine) Sueanne Margarita, MD as PCP - Sleep Medicine (Cardiology) Josue Hector, MD as Consulting Physician (Cardiology) Inda Castle, MD (Inactive) as Consulting Physician (Gastroenterology) Lavonna Monarch, MD as Consulting Physician (Dermatology) Marygrace Drought, MD as Consulting Physician (Ophthalmology) Rolm Bookbinder, MD as Consulting Physician (General Surgery) Thompson Grayer, MD as Consulting Physician (Cardiology)  This Provider for this visit: Treatment Team:  Attending Provider: Brand Males, MD    11/06/2020 -   Chief Complaint  Patient presents with  . Follow-up  F/U after PFT and feno    Follow-up shortness of breath and chronic cough in setting of prior history of heavy smoking.  Previous diagnosis of obstructive lung disease asthma/COPD. Most recently low-dose CT scan raise concern for ILD  HPI Kathryn Booker 70 y.o. -she is here to follow-up of the test results.  She continues to have significant amount of shortness of breath with exertion relieved by rest as documented above but also chronic cough.  We discussed the chronic cough in more detail it appears to have significant features of irritable larynx syndrome such as clearing of the throat and a feeling that sputum is stuck inside.   Her RSI cough score is 22 raising the concern for cough neuropathy/irritable larynx syndrome significantly.  She has had multiple test investigations namely   -High-resolution CT chest: This is negative for ILD which is good news.  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  Exhaled nitric oxide test is normal.  Pulmonary function test is also normal.  CT scan of the sinuses is normal.     Dr Lorenza Cambridge Reflux Symptom Index (> 13-15 suggestive of LPR cough) 0 -> 5  =  none ->severe problem 11/06/2020   Hoarseness of problem with voice 1  Clearing  Of Throat 4  Excess throat mucus or feeling of post nasal drip 5  Difficulty swallowing food, liquid or tablets 0  Cough after eating or lying down 2  Breathing difficulties or choking episodes 1  Troublesome or annoying cough 4  Sensation of something sticking in throat or lump in throat 4  Heartburn, chest pain, indigestion, or stomach acid coming up 1  TOTAL 22        PFT  PFT Results Latest Ref Rng & Units 11/06/2020 09/16/2016  FVC-Pre L 2.39 2.53  FVC-Predicted Pre % 81 82  FVC-Post L 2.45 2.74  FVC-Predicted Post % 83 89  Pre FEV1/FVC % % 72 72  Post FEV1/FCV % % 71 74  FEV1-Pre L 1.73 1.82  FEV1-Predicted Pre % 77 77  FEV1-Post L 1.75 2.02  DLCO uncorrected ml/min/mmHg 18.19 18.86  DLCO UNC% % 95 82  DLCO corrected ml/min/mmHg 18.19 -  DLCO COR %Predicted % 95 -  DLVA Predicted % 102 93  TLC L 4.94 8.18  TLC % Predicted % 100 166  RV % Predicted % 111 276   Lab Results  Component Value Date   NITRICOXIDE 13 11/06/2020    CT SINUS   IMPRESSION: Unremarkable CT of the paranasal sinuses.   Electronically Signed   By: Pedro Earls M.D.   On: 09/12/2020 21:40        CT CHEST  HIGH RES    IMPRESSION: 1. No findings to suggest interstitial lung disease. 2. Tracheobronchomalacia. 3. Moderate air trapping indicative of small airways disease. 4. Aortic atherosclerosis, in addition to left main and 3 vessel coronary artery disease. Please note that although the presence of coronary artery calcium documents the presence of coronary artery disease, the severity of this disease and any potential stenosis cannot be assessed on this non-gated CT examination. Assessment for potential risk factor modification, dietary therapy or pharmacologic therapy may be warranted, if clinically indicated. 5. There are calcifications of the aortic valve and mitral annulus. Echocardiographic correlation for  evaluation of potential valvular dysfunction may be warranted if clinically indicated. 6. Hepatic steatosis with evidence of early hepatic cirrhosis.  Aortic Atherosclerosis (ICD10-I70.0).   Electronically Signed   By: Vinnie Langton M.D.   On: 09/13/2020 06:34  xxxxx  Allergy and autommune Results for JAISA, DEFINO (MRN [id number]) as of 11/06/2020 11:24  Ref. Range 09/04/2020 15:01 09/04/2020 15:01  IgE (Immunoglobulin E), Serum Latest Ref Range: <OR=114 kU/L 20   SEE BELOW Unknown  Comment  Anti Nuclear Antibody (ANA) Latest Ref Range: NEGATIVE  POSITIVE (A)   ANA Pattern 1 Unknown Nuclear, Speckled (A)   ANA Titer 1 Unknown 1:80 (H) Negative  Anti JO-1 Latest Ref Range: 0.0 - 0.9 AI  <0.2  CENTROMERE AB SCREEN Latest Ref Range: 0.0 - 0.9 AI  <3.7  Cyclic Citrullin Peptide Ab Latest Units: UNITS <16   dsDNA Ab Latest Ref Range: 0 - 9 IU/mL  <1  ENA RNP Ab Latest Ref Range: 0.0 - 0.9 AI  <0.2  ENA SSA (RO) Ab Latest Ref Range: 0.0 - 0.9 AI  <0.2  ENA SSB (LA) Ab Latest Ref Range: 0.0 - 0.9 AI  <0.2  Myeloperoxidase Abs Latest Units: AI <1.0   Serine Protease 3 Latest Units: AI <1.0   RA Latex Turbid. Latest Ref Range: <14 IU/mL 33 (H)   ENA SM Ab Ser-aCnc  Latest Ref Range: 0.0 - 0.9 AI  <0.2  Chromatin Ab SerPl-aCnc Latest Ref Range: 0.0 - 0.9 AI  <0.2  Scleroderma (Scl-70) (ENA) Antibody, IgG Latest Ref Range: 0.0 - 0.9 AI  <0.2      has a past medical history of Allergies, Anxiety, Asthma, COPD (chronic obstructive pulmonary disease) ([place]), Coronary artery calcification seen on CT scan, Depression, Fatigue, Former tobacco use, Hyperlipidemia, Paroxysmal atrial fibrillation (HCC), Pre-diabetes, RBBB (right bundle branch block with left anterior fascicular block), Scoliosis, Serum calcium elevated, and Thyroid disease.   reports that she quit smoking about 6 years ago. Her smoking use included cigarettes. She has a 70.00 pack-year smoking history. She has never used smokeless tobacco.  Past Surgical History:  Procedure Laterality Date  . APPENDECTOMY    . BREAST EXCISIONAL BIOPSY Right 2017   benign  . implantable loop recorder placement  11/14/2019   Medtronic Reveal Linq model LNQ 2 (SN [id number]) implantable loop recorder   . LAPROSCOPIC     X 2 IN EARLY 30-40'S  . OVARIAN CYST REMOVAL    . RADIOACTIVE SEED GUIDED EXCISIONAL BREAST BIOPSY Right 05/15/2016   Procedure: RIGHT RADIOACTIVE SEED GUIDED EXCISIONAL BREAST BIOPSY;  Surgeon: Rolm Bookbinder, MD;  Location: [facility];  Service: General;  Laterality: Right;  RIGHT RADIOACTIVE SEED GUIDED EXCISIONAL BREAST BIOPSY  . TONSILECTOMY, ADENOIDECTOMY, BILATERAL MYRINGOTOMY AND TUBES      Allergies  Allergen Reactions  . Atorvastatin Other (See Comments)    Myalgias   . Tetanus Toxoids Itching  . Tetanus-Diphtheria Toxoids Td Other (See Comments)    Immunization History  Administered Date(s) Administered  . Fluad Quad(high Dose 65+) 07/18/2019  . Influenza, High Dose Seasonal PF 08/10/2017  . Influenza,inj,Quad PF,6+ Mos 07/06/2018  . Influenza-Unspecified 08/10/2017, 07/06/2018, 07/18/2019, 08/07/2020  . PFIZER(Purple Top)SARS-COV-2 Vaccination 11/27/2019,  12/21/2019, 08/10/2020  . PPD Test 01/26/2014  . Pneumococcal Conjugate-13 05/06/2017  . Pneumococcal Polysaccharide-23 08/19/2018  . Pneumococcal-Unspecified 12/25/2011  . Td 07/22/2006  . Tdap 05/02/2016  . Zoster 01/26/2014  . Zoster Recombinat (Shingrix) 02/23/2019, 05/25/2019    Family History  Problem Relation Age of  Onset  . Heart failure Mother   . Hypertension Mother   . Hyperlipidemia Mother   . Heart disease Mother   . Liver disease Father   . Alcohol abuse Father   . Colon cancer Neg Hx   . Esophageal cancer Neg Hx   . Stomach cancer Neg Hx      Current Outpatient Medications:  .  albuterol (PROVENTIL HFA;VENTOLIN HFA) 108 (90 Base) MCG/ACT inhaler, Use 2 inhalations 15 minutes apart every 4 hours to rescue Asthma, Disp: 3 Inhaler, Rfl: 3 .  azelastine (ASTELIN) 0.1 % nasal spray, SPRAY 2 SPRAYS INTO EACH NOSTRIL TWICE A DAY AS DIRECTED, Disp: 90 mL, Rfl: 3 .  buPROPion (WELLBUTRIN XL) 300 MG 24 hr tablet, Take 1 tablet every Morning or Mood, Focus & Concentration, Disp: 90 tablet, Rfl: 1 .  cetirizine (ZYRTEC) 10 MG tablet, Take 10 mg by mouth daily., Disp: , Rfl:  .  cholecalciferol (VITAMIN D3) 25 MCG (1000 UNIT) tablet, Take 1,000 Units by mouth daily. , Disp: , Rfl:  .  citalopram (CELEXA) 20 MG tablet, Take 20 mg by mouth daily., Disp: , Rfl:  .  cyclobenzaprine (FLEXERIL) 10 MG tablet, Take 10 mg by mouth at bedtime as needed. , Disp: , Rfl:  .  diltiazem (CARDIZEM CD) 240 MG 24 hr capsule, Take 1 capsule (240 mg total) by mouth daily., Disp: 90 capsule, Rfl: 2 .  diltiazem (CARDIZEM) 30 MG tablet, Take 1 tablet Daily for  rapid Heart Beat (Patient taking differently: as needed. Take 1 tablet Daily for  rapid Heart Beat- only if HR is above 100 and top number b/p needs to be above 100), Disp: 90 tablet, Rfl: 1 .  ezetimibe (ZETIA) 10 MG tablet, TAKE 1 TABLET BY MOUTH EVERY DAY, Disp: 90 tablet, Rfl: 1 .  fluticasone (FLONASE) 50 MCG/ACT nasal spray, PLACE 1  SPRAY INTO EACH NOSTRIL ONCE DAILY, Disp: 48 g, Rfl: 3 .  ipratropium (ATROVENT) 0.06 % nasal spray, Place 2 sprays into both nostrils 2 (two) times daily., Disp: , Rfl:  .  magnesium oxide (MAG-OX) 400 MG tablet, Take 400 mg by mouth daily., Disp: , Rfl:  .  montelukast (SINGULAIR) 10 MG tablet, Take 10 mg by mouth at bedtime. , Disp: , Rfl:  .  Omega-3 Fatty Acids (FISH OIL) 1000 MG CAPS, Take 1 capsule by mouth., Disp: , Rfl:  .  OVER THE COUNTER MEDICATION, Eye drop- 1 drop each eye as needed. Equate dry eye, Disp: , Rfl:  .  rivaroxaban (XARELTO) 20 MG TABS tablet, Take      1 tablet       Daily       to Prevent Blood Clots, Disp: 90 tablet, Rfl: 0 .  rosuvastatin (CRESTOR) 5 MG tablet, Take 5 mg by mouth daily., Disp: , Rfl:  .  valACYclovir (VALTREX) 500 MG tablet, Take 1 tablet Daily for Prevention Fever Blisters, Disp: 90 tablet, Rfl: 3 .  WIXELA INHUB 250-50 MCG/DOSE AEPB, INHALE 1 PUFF INTO THE LUNGS TWICE A DAY**RINSE MOUTH AFTER INHALATION, Disp: 180 each, Rfl: 1      Objective:   Vitals:   11/06/20 1106  BP: 114/76  Pulse: (!) 58  Temp: 97.6 F (36.4 C)  TempSrc: Temporal  SpO2: 98%  Weight: 152 lb (68.9 kg)  Height: 5\' 3"  (1.6 m)    Estimated body mass index is 26.93 kg/m as calculated from the following:   Height as of this encounter: 5\' 3"  (1.6 m).  Weight as of this encounter: 152 lb (68.9 kg).  @WEIGHTCHANGE @  [employer]   11/06/20 1106  Weight: 152 lb (68.9 kg)     Physical Exam    General: No distress. Looks well Neuro: Alert and Oriented x 3. GCS 15. Speech normal Psych: Pleasant Resp:  Barrel Chest - no.  Wheeze - no, Crackles - no, No overt respiratory distress CVS: Normal heart sounds. Murmurs - noi Ext: Stigmata of Connective Tissue Disease - no HEENT: Normal upper airway. PEERL +. No post nasal drip        Assessment:       ICD-10-CM   1. Dyspnea on exertion  R06.00 Nitric oxide    Nitric oxide    AMB referral to pulmonary  rehabilitation    CANCELED: CBC w/Diff    CANCELED: IgE    CANCELED: ANA    CANCELED: ANA+ENA+DNA/DS+Scl 70+SjoSSA/B    CANCELED: Rheumatoid Factor    CANCELED: Cyclic citrul peptide antibody, IgG    CANCELED: Mpo/pr-3 (anca) antibodies  2. Chronic cough  R05.3 AMB referral to pulmonary rehabilitation    Referral to Neuro Rehab  3. Stopped smoking with greater than 40 pack year history  Z87.891   4. Tracheobronchomalacia  J39.8   5. Irritable larynx  J38.7 Referral to Neuro Rehab  6. History of asthma  Z87.09   7. Fatty liver  K76.0   8. Positive ANA (antinuclear antibody)  R76.8   9. Rheumatoid factor positive  R76.8    She has shortness of breath that could be because of tracheobronchomalacia   She has chronic cough: This could be because of irritable larynx syndrome/cough neuropathy.  The tracheobronchomalacia and inhaler therapy and possible associated asthma could be contributing  Degree of asthma: I feel she might have outgrown the asthma or at least is extremely well controlled with the current inhaler regimen and there is probably room for reducing her asthma therapy and monitoring.   We discussed possibility of stent placement but she wants to try conservative approaches first for the tracheobronchomalacia.  We discussed possibility of gabapentin for chronic cough but she wants to be even more conservative before trying pharmaceutical interventions.  She wants to try nonpharmaceutical interventions first.    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 that is contributing to your cough and shortness of breath  -This is probably due to prior smoking and history of asthma  -There is no clear-cut evidence of COPD or emphysema at this stage despite a prior smoking history -Your asthma is being well  controlled with the inhaler or it is possible that you have actually outgrown your asthma -In addition you might have cough neuropathy - Your symptoms of shortness of breath and cough are complex interplay of multiple different factors  Plan -Refer to pulmonary rehabilitation -Referred to voice rehabilitation/neuro rehabilitation Mr Garald Balding -Choose 2 consecutive days to observe complete voice rest -Drink enough water when you feel the urge to cough -Suck on sugarless lozenge when he has the urge to cough -If these measures do not work for the cough will consider starting gabapentin [at this point in time because you are on Celexa and Wellbutrin we are going to hold off -Cut down your Wixela to once daily -> we will slowly try to taper off inhaled steroid on the assumption that your asthma is  outgrown -We will hold off on any referral to [facility] interventional pulmonology for stent placement but use this approach as the last resort  Fatty liver  -Noticed on CT scan of the chest December 2021  Plan -Please talk to primary care physician about this  Positive ANA (antinuclear antibody) Rheumatoid factor positive  -Noticed on blood work December 2021.  There is no evidence of systemic connective tissue disease.  Suspect this is trace positive in part due to aging process  Plan -Expectant follow-up   Follow-up 3-4 months with Dr. Chase Caller in a routine 15-minute slot   ( Level 05 visit: Estb 40-54 min *  in  visit type: on-site physical face to visit  in total care time and counseling or/and coordination of care by this undersigned MD - Dr Brand Males. This includes one or more of the following on this same day 11/06/2020: pre-charting, chart review, note writing, documentation discussion of test results, diagnostic or treatment recommendations, prognosis, risks and benefits of management options, instructions, education, compliance or risk-factor reduction. It excludes  time spent by the [facility] or office staff in the care of the patient. Actual time 19 min)   SIGNATURE    Dr. Brand Males, M.D., F.C.C.P,  Pulmonary and Critical Care Medicine Staff Physician, [facility] Director - Interstitial Lung Disease  Program  Pulmonary [place] [facility] [place], [place], [address]  Pager: [phone], If no answer or between  15:00h - 7:00h: call [phone] Telephone: [phone]  12:37 PM 11/06/2020

## 2020-11-07 ENCOUNTER — Encounter (HOSPITAL_COMMUNITY): Payer: Self-pay | Admitting: *Deleted

## 2020-11-07 NOTE — Progress Notes (Signed)
Received referral from Dr. Chase Caller for this pt to participate in pulmonary rehab with the the diagnosis of chronic cough and tracheobronchomalacia. Clinical review of pt follow up appt to reestablish care.  Former pt of Dr. [name] on 2/1 Pulmonary office note. Also reviewed EP progress note along with follow up in the afib clinic.  Pt with Covid Risk Score - 5. Pt appropriate for scheduling for Pulmonary rehab.  Will forward to support staff for scheduling and verification of insurance eligibility/benefits with pt consent. Cherre Huger, BSN Cardiac and Training and development officer

## 2020-11-14 DIAGNOSIS — G4733 Obstructive sleep apnea (adult) (pediatric): Secondary | ICD-10-CM | POA: Diagnosis not present

## 2020-11-18 LAB — CUP PACEART REMOTE DEVICE CHECK
Date Time Interrogation Session: 20220213075946
Implantable Pulse Generator Implant Date: 20210208

## 2020-11-19 ENCOUNTER — Ambulatory Visit (INDEPENDENT_AMBULATORY_CARE_PROVIDER_SITE_OTHER): Payer: Medicare PPO

## 2020-11-19 DIAGNOSIS — I480 Paroxysmal atrial fibrillation: Secondary | ICD-10-CM

## 2020-11-23 NOTE — Progress Notes (Signed)
Carelink Summary Report / Loop Recorder 

## 2020-11-30 ENCOUNTER — Telehealth (HOSPITAL_COMMUNITY): Payer: Self-pay

## 2020-11-30 NOTE — Telephone Encounter (Signed)
Pt returned [place] phone call and stated she is interested, went over insurance, patient verbalized understanding. Adv pt we will be in contact with her once we start scheduling for April.

## 2020-12-06 ENCOUNTER — Telehealth (HOSPITAL_COMMUNITY): Payer: Self-pay

## 2020-12-06 ENCOUNTER — Encounter (HOSPITAL_COMMUNITY): Payer: Self-pay

## 2020-12-06 DIAGNOSIS — I2510 Atherosclerotic heart disease of native coronary artery without angina pectoris: Secondary | ICD-10-CM | POA: Diagnosis not present

## 2020-12-06 DIAGNOSIS — E7800 Pure hypercholesterolemia, unspecified: Secondary | ICD-10-CM | POA: Diagnosis not present

## 2020-12-06 DIAGNOSIS — M810 Age-related osteoporosis without current pathological fracture: Secondary | ICD-10-CM | POA: Diagnosis not present

## 2020-12-06 NOTE — Telephone Encounter (Signed)
Attempted to call patient in regards to Pulmonary Rehab - LM on VM Mailed letter 

## 2020-12-12 DIAGNOSIS — G4733 Obstructive sleep apnea (adult) (pediatric): Secondary | ICD-10-CM | POA: Diagnosis not present

## 2020-12-13 DIAGNOSIS — E213 Hyperparathyroidism, unspecified: Secondary | ICD-10-CM | POA: Diagnosis not present

## 2020-12-13 DIAGNOSIS — K219 Gastro-esophageal reflux disease without esophagitis: Secondary | ICD-10-CM | POA: Diagnosis not present

## 2020-12-13 DIAGNOSIS — I480 Paroxysmal atrial fibrillation: Secondary | ICD-10-CM | POA: Diagnosis not present

## 2020-12-13 DIAGNOSIS — Z6827 Body mass index (BMI) 27.0-27.9, adult: Secondary | ICD-10-CM | POA: Diagnosis not present

## 2020-12-13 DIAGNOSIS — R7303 Prediabetes: Secondary | ICD-10-CM | POA: Diagnosis not present

## 2020-12-13 DIAGNOSIS — M810 Age-related osteoporosis without current pathological fracture: Secondary | ICD-10-CM | POA: Diagnosis not present

## 2020-12-24 ENCOUNTER — Ambulatory Visit (INDEPENDENT_AMBULATORY_CARE_PROVIDER_SITE_OTHER): Payer: Medicare PPO

## 2020-12-24 DIAGNOSIS — I480 Paroxysmal atrial fibrillation: Secondary | ICD-10-CM

## 2020-12-24 LAB — CUP PACEART REMOTE DEVICE CHECK
Date Time Interrogation Session: 20220321075358
Implantable Pulse Generator Implant Date: 20210208

## 2021-01-01 DIAGNOSIS — J398 Other specified diseases of upper respiratory tract: Secondary | ICD-10-CM | POA: Diagnosis not present

## 2021-01-01 DIAGNOSIS — R6889 Other general symptoms and signs: Secondary | ICD-10-CM | POA: Diagnosis not present

## 2021-01-01 NOTE — Progress Notes (Signed)
Carelink Summary Report / Loop Recorder 

## 2021-01-02 DIAGNOSIS — R1312 Dysphagia, oropharyngeal phase: Secondary | ICD-10-CM | POA: Diagnosis not present

## 2021-01-02 DIAGNOSIS — R070 Pain in throat: Secondary | ICD-10-CM | POA: Diagnosis not present

## 2021-01-04 DIAGNOSIS — G4733 Obstructive sleep apnea (adult) (pediatric): Secondary | ICD-10-CM | POA: Diagnosis not present

## 2021-01-12 DIAGNOSIS — G4733 Obstructive sleep apnea (adult) (pediatric): Secondary | ICD-10-CM | POA: Diagnosis not present

## 2021-01-15 DIAGNOSIS — K120 Recurrent oral aphthae: Secondary | ICD-10-CM | POA: Diagnosis not present

## 2021-01-16 ENCOUNTER — Ambulatory Visit: Payer: Medicare PPO | Admitting: Internal Medicine

## 2021-01-16 ENCOUNTER — Other Ambulatory Visit: Payer: Self-pay

## 2021-01-16 ENCOUNTER — Encounter: Payer: Self-pay | Admitting: Internal Medicine

## 2021-01-16 VITALS — BP 126/74 | HR 61 | Ht 63.0 in | Wt 150.0 lb

## 2021-01-16 DIAGNOSIS — I2510 Atherosclerotic heart disease of native coronary artery without angina pectoris: Secondary | ICD-10-CM | POA: Diagnosis not present

## 2021-01-16 DIAGNOSIS — I480 Paroxysmal atrial fibrillation: Secondary | ICD-10-CM | POA: Diagnosis not present

## 2021-01-16 DIAGNOSIS — I10 Essential (primary) hypertension: Secondary | ICD-10-CM | POA: Diagnosis not present

## 2021-01-16 DIAGNOSIS — G4733 Obstructive sleep apnea (adult) (pediatric): Secondary | ICD-10-CM

## 2021-01-16 DIAGNOSIS — E782 Mixed hyperlipidemia: Secondary | ICD-10-CM | POA: Diagnosis not present

## 2021-01-16 DIAGNOSIS — I2584 Coronary atherosclerosis due to calcified coronary lesion: Secondary | ICD-10-CM

## 2021-01-16 NOTE — Progress Notes (Signed)
PCP: Deland Pretty, MD   Primary EP: Dr Malva Cogan Syrita Dovel is a 70 y.o. female who presents today for routine electrophysiology followup.  Since last being seen in our clinic, the patient reports doing very well.  She feels that her afib is mostly controlled.  She does have occasional events.   Past Medical History:  Diagnosis Date  . Allergies   . Anxiety   . Asthma   . COPD (chronic obstructive pulmonary disease) ([facility])   . Coronary artery calcification seen on CT scan   . Depression   . Fatigue   . Former tobacco use   . Hyperlipidemia   . Paroxysmal atrial fibrillation ([facility])   . Pre-diabetes   . RBBB (right bundle branch block with left anterior fascicular block)   . Scoliosis   . Serum calcium elevated   . Thyroid disease    "questionable" per patient   Past Surgical History:  Procedure Laterality Date  . APPENDECTOMY    . BREAST EXCISIONAL BIOPSY Right 2017   benign  . implantable loop recorder placement  11/14/2019   Medtronic Reveal Linq model LNQ 2 (SN [id number]) implantable loop recorder   . LAPROSCOPIC     X 2 IN EARLY 30-40'S  . OVARIAN CYST REMOVAL    . RADIOACTIVE SEED GUIDED EXCISIONAL BREAST BIOPSY Right 05/15/2016   Procedure: RIGHT RADIOACTIVE SEED GUIDED EXCISIONAL BREAST BIOPSY;  Surgeon: Rolm Bookbinder, MD;  Location: [facility];  Service: General;  Laterality: Right;  RIGHT RADIOACTIVE SEED GUIDED EXCISIONAL BREAST BIOPSY  . TONSILECTOMY, ADENOIDECTOMY, BILATERAL MYRINGOTOMY AND TUBES      ROS- all systems are reviewed and negatives except as per HPI above  Current Outpatient Medications  Medication Sig Dispense Refill  . albuterol (PROVENTIL HFA;VENTOLIN HFA) 108 (90 Base) MCG/ACT inhaler Use 2 inhalations 15 minutes apart every 4 hours to rescue Asthma 3 Inhaler 3  . azelastine (ASTELIN) 0.1 % nasal spray SPRAY 2 SPRAYS INTO EACH NOSTRIL TWICE A DAY AS DIRECTED 90 mL 3  . buPROPion (WELLBUTRIN XL) 300 MG 24 hr tablet  Take 1 tablet every Morning or Mood, Focus & Concentration 90 tablet 1  . cetirizine (ZYRTEC) 10 MG tablet Take 10 mg by mouth daily.    . cholecalciferol (VITAMIN D3) 25 MCG (1000 UNIT) tablet Take 1,000 Units by mouth daily.     . citalopram (CELEXA) 20 MG tablet Take 20 mg by mouth daily.    [name] COVID-19 mRNA vaccine, Pfizer, 30 MCG/0.3ML injection USE AS DIRECTED .3 mL 0  . cyclobenzaprine (FLEXERIL) 10 MG tablet Take 10 mg by mouth at bedtime as needed.     . diltiazem (CARDIZEM CD) 240 MG 24 hr capsule Take 1 capsule (240 mg total) by mouth daily. 90 capsule 2  . diltiazem (CARDIZEM) 30 MG tablet Take 1 tablet Daily for  rapid Heart Beat (Patient taking differently: as needed. Take 1 tablet Daily for  rapid Heart Beat- only if HR is above 100 and top number b/p needs to be above 100) 90 tablet 1  . ezetimibe (ZETIA) 10 MG tablet TAKE 1 TABLET BY MOUTH EVERY DAY 90 tablet 1  . fluticasone (FLONASE) 50 MCG/ACT nasal spray PLACE 1 SPRAY INTO EACH NOSTRIL ONCE DAILY 48 g 3  . ipratropium (ATROVENT) 0.06 % nasal spray Place 2 sprays into both nostrils 2 (two) times daily.    . magnesium oxide (MAG-OX) 400 MG tablet Take 400 mg by mouth daily.    [name]  montelukast (SINGULAIR) 10 MG tablet Take 10 mg by mouth at bedtime.     . Omega-3 Fatty Acids (FISH OIL) 1000 MG CAPS Take 1 capsule by mouth.    [name] OVER THE COUNTER MEDICATION Eye drop- 1 drop each eye as needed. Equate dry eye    . rivaroxaban (XARELTO) 20 MG TABS tablet Take      1 tablet       Daily       to Prevent Blood Clots 90 tablet 0  . rosuvastatin (CRESTOR) 5 MG tablet Take 5 mg by mouth daily.    . valACYclovir (VALTREX) 500 MG tablet Take 1 tablet Daily for Prevention Fever Blisters 90 tablet 3  . WIXELA INHUB 250-50 MCG/DOSE AEPB INHALE 1 PUFF INTO THE LUNGS TWICE A DAY**RINSE MOUTH AFTER INHALATION 180 each 1   No current facility-administered medications for this visit.    Physical Exam: Vitals:   01/16/21 1502  BP: 126/74  Pulse:  61  SpO2: 97%  Weight: 150 lb (68 kg)  Height: 5\' 3"  (1.6 m)    GEN- The patient is well appearing, alert and oriented x 3 today.   Head- normocephalic, atraumatic Eyes-  Sclera clear, conjunctiva pink Ears- hearing intact Oropharynx- clear Lungs-  normal work of breathing Heart- Regular rate and rhythm  GI- soft,  Extremities- no clubbing, cyanosis, or edema  Wt Readings from Last 3 Encounters:  01/16/21 150 lb (68 kg)  11/06/20 152 lb (68.9 kg)  10/18/20 152 lb 12.8 oz (69.3 kg)   Echo 07/26/20- EF 55%  EKG tracing ordered today is personally reviewed and shows sinus with PACs, RBBB/LAHB  Assessment and Plan:  1. Paroxysmal atrial fibrillation afib burden by ILR is 2.6 % (previously 3.5%) chads2vasc score is 3.  She is on xarelto  previously took multaq but stopped due to concerns of hair loss. We discussed ablation.  I think she would be a good candidate.  She is not ready.  2. HTN Stable No change required today  3. OSA She is compliant with CPAP  Risks, benefits and potential toxicities for medications prescribed and/or refilled reviewed with patient today.   Return in 6 months to see EP APP  Thompson Grayer MD, [facility] 01/16/2021 3:02 PM

## 2021-01-16 NOTE — Patient Instructions (Addendum)
Medication Instructions:  Your physician recommends that you continue on your current medications as directed. Please refer to the Current Medication list given to you today.  Labwork: None ordered.  Testing/Procedures: None ordered.  Follow-Up: Your physician wants you to follow-up in: 6 months with Thompson Grayer, MD or one of the following Advanced Practice Providers on your designated Care Team:      Tommye Standard, PA-C     You will receive a reminder letter in the mail two months in advance. If you don't receive a letter, please call our office to schedule the follow-up appointment.   Any Other Special Instructions Will Be Listed Below (If Applicable).  If you need a refill on your cardiac medications before your next appointment, please call your pharmacy.

## 2021-01-17 ENCOUNTER — Telehealth (HOSPITAL_COMMUNITY): Payer: Self-pay | Admitting: *Deleted

## 2021-01-17 NOTE — Addendum Note (Signed)
Addended by: Rose Phi on: 01/17/2021 04:26 PM   Modules accepted: Orders

## 2021-01-18 ENCOUNTER — Encounter (HOSPITAL_COMMUNITY): Payer: Self-pay

## 2021-01-18 ENCOUNTER — Encounter (HOSPITAL_COMMUNITY)
Admission: RE | Admit: 2021-01-18 | Discharge: 2021-01-18 | Disposition: A | Discharge status: Home / Self Care | Payer: Medicare PPO | Source: Ambulatory Visit | Attending: Internal Medicine | Admitting: Internal Medicine

## 2021-01-18 ENCOUNTER — Other Ambulatory Visit: Payer: Self-pay

## 2021-01-18 VITALS — BP 116/60 | HR 69 | Ht 62.0 in | Wt 151.5 lb

## 2021-01-18 DIAGNOSIS — J398 Other specified diseases of upper respiratory tract: Secondary | ICD-10-CM

## 2021-01-18 DIAGNOSIS — R053 Chronic cough: Secondary | ICD-10-CM | POA: Diagnosis not present

## 2021-01-18 NOTE — Progress Notes (Signed)
Kathryn Booker 70 y.o. female Pulmonary Rehab Physical Assesment Note  Physical assessment reveals heart rate is normal pt with history of paroxysmal afib.  She is aware of when her heart is beating irregular. Breath sounds clear to auscultation, no wheezes coarse breath sounds lower bases. Pt with non productive cough mostly but occasionally will have thick sticky mucus. Bowel sounds present x 4 quadrants.  No complaints of nausea, vomiting, constipation or diarrhea. Grip strength equal, strong. Distal pulses palpable with no swelling. Kathryn Booker is looking forward to pulmonary rehab. Cherre Huger, BSN Cardiac and Training and development officer

## 2021-01-18 NOTE — Progress Notes (Signed)
Kathryn Booker 70 y.o. female Pulmonary Rehab Orientation Note This patient who was referred to Pulmonary rehab by Dr. Chase Caller with the diagnosis of Chronic cough and other specified diseases of upper respiratory tract arrived today in Cardiac and Pulmonary Rehab. She arrived ambulatory with steady gait. She does not carry portable oxygen. Per pt, she uses oxygen at night when going to sleep. Color good, skin warm and dry. Patient is oriented to time and place. Patient's medical history, psychosocial health, and medications reviewed. Psychosocial assessment reveals pt lives alone. Pt is currently retired. Pt hobbies include gardening. Pt reports her stress level is low. Areas of stress/anxiety include Health.  PHQ2/9 score 0/1. Pt shows good coping skills with positive outlook . Kathryn Booker was offered emotional support and reassurance. Will continue to monitor and evaluate progress toward psychosocial goal(s) of maintaining mental health status and improving quality of life. Patient reports she does take medications as prescribed. Patient states she follows a Regular diet. The patient reports she wants/needs to lose weight but has not made any changes to diet, although she does exercise a couple of times a week. .Patient's weight will be monitored closely. Demonstration and practice of PLB using pulse oximeter. Patient able to return demonstration satisfactorily. Safety and hand hygiene in the exercise area reviewed with patient. Patient voices understanding of the information reviewed. Department expectations discussed with patient and achievable goals were set. The patient shows enthusiasm about attending the program and we look forward to working with this nice lady. The patient completed a 6 min walk test today and is schedule to begin exercise on 01/22/21.  45 minutes was spent on a variety of activities such as assessment of the patient, obtaining baseline data including height, weight, BMI, and grip  strength, verifying medical history, allergies, and current medications, and teaching patient strategies for performing tasks with less respiratory effort with emphasis on pursed lip breathing.  Rick Duff MS, ACSM CEP 11:15 AM 01/18/2021

## 2021-01-18 NOTE — Progress Notes (Signed)
Pulmonary Individual Treatment Plan  Patient Details  Name: Kathryn Booker MRN: [id number] Date of Birth: [birth year] Referring Provider:   April Manson Pulmonary Rehab Walk Test from 01/18/2021 in [place]  Referring Provider Dr. Chase Caller      Initial Encounter Date:  Flowsheet Row Pulmonary Rehab Walk Test from 01/18/2021 in [facility]  Date 01/18/21      Visit Diagnosis: Chronic cough  Other specified diseases of upper respiratory tract  Patient's Home Medications on Admission:   Current Outpatient Medications:  .  albuterol (PROVENTIL HFA;VENTOLIN HFA) 108 (90 Base) MCG/ACT inhaler, Use 2 inhalations 15 minutes apart every 4 hours to rescue Asthma, Disp: 3 Inhaler, Rfl: 3 .  azelastine (ASTELIN) 0.1 % nasal spray, SPRAY 2 SPRAYS INTO EACH NOSTRIL TWICE A DAY AS DIRECTED, Disp: 90 mL, Rfl: 3 .  buPROPion (WELLBUTRIN XL) 300 MG 24 hr tablet, Take 1 tablet every Morning or Mood, Focus & Concentration, Disp: 90 tablet, Rfl: 1 .  cetirizine (ZYRTEC) 10 MG tablet, Take 10 mg by mouth daily., Disp: , Rfl:  .  cholecalciferol (VITAMIN D3) 25 MCG (1000 UNIT) tablet, Take 1,000 Units by mouth daily. , Disp: , Rfl:  .  citalopram (CELEXA) 20 MG tablet, Take 20 mg by mouth daily., Disp: , Rfl:  .  COVID-19 mRNA vaccine, Pfizer, 30 MCG/0.3ML injection, USE AS DIRECTED, Disp: .3 mL, Rfl: 0 .  cyclobenzaprine (FLEXERIL) 10 MG tablet, Take 10 mg by mouth at bedtime as needed. , Disp: , Rfl:  .  diltiazem (CARDIZEM CD) 240 MG 24 hr capsule, Take 1 capsule (240 mg total) by mouth daily., Disp: 90 capsule, Rfl: 2 .  diltiazem (CARDIZEM) 30 MG tablet, Take 1 tablet Daily for  rapid Heart Beat (Patient taking differently: as needed. Take 1 tablet Daily for  rapid Heart Beat- only if HR is above 100 and top number b/p needs to be above 100), Disp: 90 tablet, Rfl: 1 .  ezetimibe (ZETIA) 10 MG tablet, TAKE 1 TABLET BY MOUTH EVERY DAY,  Disp: 90 tablet, Rfl: 1 .  fluticasone (FLONASE) 50 MCG/ACT nasal spray, PLACE 1 SPRAY INTO EACH NOSTRIL ONCE DAILY, Disp: 48 g, Rfl: 3 .  ipratropium (ATROVENT) 0.06 % nasal spray, Place 2 sprays into both nostrils 2 (two) times daily., Disp: , Rfl:  .  magnesium oxide (MAG-OX) 400 MG tablet, Take 400 mg by mouth daily., Disp: , Rfl:  .  montelukast (SINGULAIR) 10 MG tablet, Take 10 mg by mouth at bedtime. , Disp: , Rfl:  .  Omega-3 Fatty Acids (FISH OIL) 1000 MG CAPS, Take 1 capsule by mouth., Disp: , Rfl:  .  OVER THE COUNTER MEDICATION, Eye drop- 1 drop each eye as needed. Equate dry eye, Disp: , Rfl:  .  rivaroxaban (XARELTO) 20 MG TABS tablet, Take      1 tablet       Daily       to Prevent Blood Clots, Disp: 90 tablet, Rfl: 0 .  rosuvastatin (CRESTOR) 5 MG tablet, Take 5 mg by mouth daily., Disp: , Rfl:  .  valACYclovir (VALTREX) 500 MG tablet, Take 1 tablet Daily for Prevention Fever Blisters, Disp: 90 tablet, Rfl: 3 .  WIXELA INHUB 250-50 MCG/DOSE AEPB, INHALE 1 PUFF INTO THE LUNGS TWICE A DAY**RINSE MOUTH AFTER INHALATION (Patient taking differently: 1 puff once.), Disp: 180 each, Rfl: 1  Past Medical History: Past Medical History:  Diagnosis Date  . Allergies   .  Anxiety   . Asthma   . COPD (chronic obstructive pulmonary disease) ([place])   . Coronary artery calcification seen on CT scan   . Depression   . Fatigue   . Former tobacco use   . Hyperlipidemia   . Paroxysmal atrial fibrillation ([facility])   . Pre-diabetes   . RBBB (right bundle branch block with left anterior fascicular block)   . Scoliosis   . Serum calcium elevated   . Thyroid disease    "questionable" per patient    Tobacco Use: Social History   Tobacco Use  Smoking Status Former Smoker  . Packs/day: 2.00  . Years: 35.00  . Pack years: 70.00  . Types: Cigarettes  . Quit date: 11/19/2013  . Years since quitting: 7.1  Smokeless Tobacco Never Used    Labs: Recent Review Flowsheet Data    Labs for ITP  Cardiac and Pulmonary Rehab Latest Ref Rng & Units 11/10/2016 02/17/2017 05/06/2017 11/06/2017 05/27/2018   Cholestrol <200 mg/dL 182 197 223(H) 221(H) 227(H)   LDLCALC mg/dL (calc) 76 128(H) 135(H) 131(H) 137(H)   HDL >50 mg/dL 52 41(L) 55 55 60   Trlycerides <150 mg/dL 270(H) 138 166(H) 213(H) 160(H)   Hemoglobin A1c <5.7 % of total Hgb 7.8(H) 5.9(H) 5.6 6.0(H) 5.9(H)      Capillary Blood Glucose: No results found for: GLUCAP   Pulmonary Assessment Scores:  Pulmonary Assessment Scores    Row Name 01/18/21 1038         ADL UCSD   ADL Phase Entry     SOB Score total 12           CAT Score   CAT Score 13           mMRC Score   mMRC Score 0           UCSD: Self-administered rating of dyspnea associated with activities of daily living (ADLs) 6-point scale (0 = "not at all" to 5 = "maximal or unable to do because of breathlessness")  Scoring Scores range from 0 to 120.  Minimally important difference is 5 units  CAT: CAT can identify the health impairment of COPD patients and is better correlated with disease progression.  CAT has a scoring range of zero to 40. The CAT score is classified into four groups of low (less than 10), medium (10 - 20), high (21-30) and very high (31-40) based on the impact level of disease on health status. A CAT score over 10 suggests significant symptoms.  A worsening CAT score could be explained by an exacerbation, poor medication adherence, poor inhaler technique, or progression of COPD or comorbid conditions.  CAT MCID is 2 points  mMRC: mMRC (Modified Medical Research Council) Dyspnea Scale is used to assess the degree of baseline functional disability in patients of respiratory disease due to dyspnea. No minimal important difference is established. A decrease in score of 1 point or greater is considered a positive change.   Pulmonary Function Assessment:   Exercise Target Goals: Exercise Program Goal: Individual exercise prescription set  using results from initial 6 min walk test and THRR while considering  patient's activity barriers and safety.   Exercise Prescription Goal: Initial exercise prescription builds to 30-45 minutes a day of aerobic activity, 2-3 days per week.  Home exercise guidelines will be given to patient during program as part of exercise prescription that the participant will acknowledge.  Activity Barriers & Risk Stratification:  Activity Barriers & Cardiac Risk Stratification - 01/18/21 0940  Activity Barriers & Cardiac Risk Stratification   Activity Barriers Shortness of Breath;Back Problems;Deconditioning    Cardiac Risk Stratification Moderate           6 Minute Walk:  6 Minute Walk    Row Name 01/18/21 1049         6 Minute Walk   Phase Initial     Distance 1222 feet     Walk Time 6 minutes     # of Rest Breaks 0     MPH 2.31     METS 2.48     RPE 9     Perceived Dyspnea  1.5     VO2 Peak 8.69     Symptoms No     Resting HR 69 bpm     Resting BP 116/60     Resting Oxygen Saturation  96 %     Exercise Oxygen Saturation  during 6 min walk 95 %     Max Ex. HR 76 bpm     Max Ex. BP 122/66     2 Minute Post BP 120/60           Interval HR   1 Minute HR 72     2 Minute HR 72     3 Minute HR 72     4 Minute HR 76     5 Minute HR 76     6 Minute HR 76     2 Minute Post HR 57     Interval Heart Rate? Yes           Interval Oxygen   Interval Oxygen? Yes     Baseline Oxygen Saturation % 96 %     1 Minute Oxygen Saturation % 95 %     1 Minute Liters of Oxygen 0 L     2 Minute Oxygen Saturation % 95 %     2 Minute Liters of Oxygen 0 L     3 Minute Oxygen Saturation % 95 %     3 Minute Liters of Oxygen 0 L     4 Minute Oxygen Saturation % 95 %     4 Minute Liters of Oxygen 0 L     5 Minute Oxygen Saturation % 96 %     5 Minute Liters of Oxygen 0 L     6 Minute Oxygen Saturation % 96 %     6 Minute Liters of Oxygen 0 L     2 Minute Post Oxygen Saturation % 98 %      2 Minute Post Liters of Oxygen 0 L            Oxygen Initial Assessment:  Oxygen Initial Assessment - 01/18/21 0942      Home Oxygen   Home Oxygen Device None    Sleep Oxygen Prescription CPAP    Home Exercise Oxygen Prescription None    Home Resting Oxygen Prescription None    Compliance with Home Oxygen Use Yes      Initial 6 min Walk   Oxygen Used None      Program Oxygen Prescription   Program Oxygen Prescription None      Intervention   Short Term Goals To learn and exhibit compliance with exercise, home and travel O2 prescription;To learn and understand importance of monitoring SPO2 with pulse oximeter and demonstrate accurate use of the pulse oximeter.;To learn and understand importance of maintaining oxygen saturations>88%;To learn and demonstrate proper pursed lip breathing techniques or other  breathing techniques.;To learn and demonstrate proper use of respiratory medications    Long  Term Goals Exhibits compliance with exercise, home and travel O2 prescription;Verbalizes importance of monitoring SPO2 with pulse oximeter and return demonstration;Maintenance of O2 saturations>88%;Exhibits proper breathing techniques, such as pursed lip breathing or other method taught during program session;Compliance with respiratory medication;Demonstrates proper use of MDI's           Oxygen Re-Evaluation:   Oxygen Discharge (Final Oxygen Re-Evaluation):   Initial Exercise Prescription:  Initial Exercise Prescription - 01/18/21 1000      Date of Initial Exercise RX and Referring Provider   Date 01/18/21    Referring Provider Dr. Chase Caller    Expected Discharge Date 03/21/21      Treadmill   MPH 2    Grade 0    Minutes 15      Recumbant Bike   Level 1.5    RPM 60    Minutes 15      Prescription Details   Frequency (times per week) 2    Duration Progress to 30 minutes of continuous aerobic without signs/symptoms of physical distress      Intensity   THRR 40-80%  of Max Heartrate 61-122    Ratings of Perceived Exertion 11-13    Perceived Dyspnea 0-4      Progression   Progression Continue to progress workloads to maintain intensity without signs/symptoms of physical distress.      Resistance Training   Training Prescription Yes    Weight Red bands    Reps 10-15           Perform Capillary Blood Glucose checks as needed.  Exercise Prescription Changes:   Exercise Comments:   Exercise Goals and Review:  Exercise Goals    Row Name 01/18/21 1036             Exercise Goals   Increase Physical Activity Yes       Intervention Provide advice, education, support and counseling about physical activity/exercise needs.;Develop an individualized exercise prescription for aerobic and resistive training based on initial evaluation findings, risk stratification, comorbidities and participant's personal goals.       Expected Outcomes Short Term: Attend rehab on a regular basis to increase amount of physical activity.;Long Term: Add in home exercise to make exercise part of routine and to increase amount of physical activity.;Long Term: Exercising regularly at least 3-5 days a week.       Increase Strength and Stamina Yes       Intervention Provide advice, education, support and counseling about physical activity/exercise needs.;Develop an individualized exercise prescription for aerobic and resistive training based on initial evaluation findings, risk stratification, comorbidities and participant's personal goals.       Expected Outcomes Short Term: Increase workloads from initial exercise prescription for resistance, speed, and METs.;Short Term: Perform resistance training exercises routinely during rehab and add in resistance training at home;Long Term: Improve cardiorespiratory fitness, muscular endurance and strength as measured by increased METs and functional capacity (6MWT)       Able to understand and use rate of perceived exertion (RPE) scale  Yes       Intervention Provide education and explanation on how to use RPE scale       Expected Outcomes Short Term: Able to use RPE daily in rehab to express subjective intensity level;Long Term:  Able to use RPE to guide intensity level when exercising independently       Able to understand and use Dyspnea  scale Yes       Intervention Provide education and explanation on how to use Dyspnea scale       Expected Outcomes Short Term: Able to use Dyspnea scale daily in rehab to express subjective sense of shortness of breath during exertion;Long Term: Able to use Dyspnea scale to guide intensity level when exercising independently       Knowledge and understanding of Target Heart Rate Range (THRR) Yes       Intervention Provide education and explanation of THRR including how the numbers were predicted and where they are located for reference       Expected Outcomes Short Term: Able to state/look up THRR;Short Term: Able to use daily as guideline for intensity in rehab;Long Term: Able to use THRR to govern intensity when exercising independently       Understanding of Exercise Prescription Yes       Intervention Provide education, explanation, and written materials on patient's individual exercise prescription       Expected Outcomes Short Term: Able to explain program exercise prescription;Long Term: Able to explain home exercise prescription to exercise independently              Exercise Goals Re-Evaluation :   Discharge Exercise Prescription (Final Exercise Prescription Changes):   Nutrition:  Target Goals: Understanding of nutrition guidelines, daily intake of sodium 1500mg , cholesterol 200mg , calories 30% from fat and 7% or less from saturated fats, daily to have 5 or more servings of fruits and vegetables.  Biometrics:  Pre Biometrics - 01/18/21 1037      Pre Biometrics   Grip Strength 19 kg            Nutrition Therapy Plan and Nutrition Goals:   Nutrition  Assessments:  MEDIFICTS Score Key:  ?70 Need to make dietary changes   40-70 Heart Healthy Diet  ? 40 Therapeutic Level Cholesterol Diet   Picture Your Plate Scores:  <02 Unhealthy dietary pattern with much room for improvement.  41-50 Dietary pattern unlikely to meet recommendations for good health and room for improvement.  51-60 More healthful dietary pattern, with some room for improvement.   >60 Healthy dietary pattern, although there may be some specific behaviors that could be improved.    Nutrition Goals Re-Evaluation:   Nutrition Goals Discharge (Final Nutrition Goals Re-Evaluation):   Psychosocial: Target Goals: Acknowledge presence or absence of significant depression and/or stress, maximize coping skills, provide positive support system. Participant is able to verbalize types and ability to use techniques and skills needed for reducing stress and depression.  Initial Review & Psychosocial Screening:  Initial Psych Review & Screening - 01/18/21 1040      Screening Interventions   Expected Outcomes Long Term Goal: Stressors or current issues are controlled or eliminated.           Quality of Life Scores:  Scores of 19 and below usually indicate a poorer quality of life in these areas.  A difference of  2-3 points is a clinically meaningful difference.  A difference of 2-3 points in the total score of the Quality of Life Index has been associated with significant improvement in overall quality of life, self-image, physical symptoms, and general health in studies assessing change in quality of life.  PHQ-9: Recent Review Flowsheet Data    Depression screen [facility] 2/9 01/18/2021 01/18/2021 02/02/2019 08/07/2016   Decreased Interest - 0 0 0   Down, Depressed, Hopeless 0 0 0 0   PHQ - 2 Score  0 0 0 0   Altered sleeping 0 - - -   Tired, decreased energy 1 - - -   Change in appetite 0 - - -   Feeling bad or failure about yourself  0 - - -   Trouble concentrating 0 -  - -   Moving slowly or fidgety/restless 0 - - -   Suicidal thoughts 0 - - -   PHQ-9 Score 1 - - -   Difficult doing work/chores Not difficult at all - - -     Interpretation of Total Score  Total Score Depression Severity:  1-4 = Minimal depression, 5-9 = Mild depression, 10-14 = Moderate depression, 15-19 = Moderately severe depression, 20-27 = Severe depression   Psychosocial Evaluation and Intervention:  Psychosocial Evaluation - 01/18/21 1041      Psychosocial Evaluation & Interventions   Interventions Encouraged to exercise with the program and follow exercise prescription    Comments Pt has a history of depression and is on medication treatment that controls her depression. She states she does not experience depression on the medication. She does not have any family but has close friends and neighbors that she considers family.    Expected Outcomes Imrpove quality of life and maintain decreased levels of stress and depression.    Continue Psychosocial Services  No Follow up required           Psychosocial Re-Evaluation:   Psychosocial Discharge (Final Psychosocial Re-Evaluation):   Education: Education Goals: Education classes will be provided on a weekly basis, covering required topics. Participant will state understanding/return demonstration of topics presented.  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 Wears glasses   Learning Preferences Skilled Demonstration;Individual Instruction;Verbal Instruction           Education Topics: Risk Factor Reduction:  -Group instruction that is supported by a PowerPoint presentation. Instructor discusses the definition of a risk factor, different risk factors for pulmonary disease, and how the heart and lungs work together.     Nutrition for Pulmonary Patient:  -Group  instruction provided by PowerPoint slides, verbal discussion, and written materials to support subject matter. The instructor gives an explanation and review of healthy diet recommendations, which includes a discussion on weight management, recommendations for fruit and vegetable consumption, as well as protein, fluid, caffeine, fiber, sodium, sugar, and alcohol. Tips for eating when patients are short of breath are discussed.   Pursed Lip Breathing:  -Group instruction that is supported by demonstration and informational handouts. Instructor discusses the benefits of pursed lip and diaphragmatic breathing and detailed demonstration on how to preform both.     Oxygen Safety:  -Group instruction provided by PowerPoint, verbal discussion, and written material to support subject matter. There is an overview of "What is Oxygen" and "Why do we need it".  Instructor also reviews how to create a safe environment for oxygen use, the importance of using oxygen as prescribed, and the risks of noncompliance. There is a brief discussion on traveling with oxygen and resources the patient may utilize.   Oxygen Equipment:  -Group instruction provided by [facility] Staff utilizing handouts, written materials, and equipment demonstrations.   Signs and Symptoms:  -Group instruction provided by written material and verbal discussion to support subject matter. Warning signs and symptoms of infection, stroke, and heart attack are reviewed and when to call the  physician/911 reinforced. Tips for preventing the spread of infection discussed.   Advanced Directives:  -Group instruction provided by verbal instruction and written material to support subject matter. Instructor reviews Advanced Directive laws and proper instruction for filling out document.   Pulmonary Video:  -Group video education that reviews the importance of medication and oxygen compliance, exercise, good nutrition, pulmonary hygiene, and pursed  lip and diaphragmatic breathing for the pulmonary patient.   Exercise for the Pulmonary Patient:  -Group instruction that is supported by a PowerPoint presentation. Instructor discusses benefits of exercise, core components of exercise, frequency, duration, and intensity of an exercise routine, importance of utilizing pulse oximetry during exercise, safety while exercising, and options of places to exercise outside of rehab.     Pulmonary Medications:  -Verbally interactive group education provided by instructor with focus on inhaled medications and proper administration.   Anatomy and Physiology of the Respiratory System and Intimacy:  -Group instruction provided by PowerPoint, verbal discussion, and written material to support subject matter. Instructor reviews respiratory cycle and anatomical components of the respiratory system and their functions. Instructor also reviews differences in obstructive and restrictive respiratory diseases with examples of each. Intimacy, Sex, and Sexuality differences are reviewed with a discussion on how relationships can change when diagnosed with pulmonary disease. Common sexual concerns are reviewed.   MD DAY -A group question and answer session with a medical doctor that allows participants to ask questions that relate to their pulmonary disease state.   OTHER EDUCATION -Group or individual verbal, written, or video instructions that support the educational goals of the pulmonary rehab program.   Holiday Eating Survival Tips:  -Group instruction provided by PowerPoint slides, verbal discussion, and written materials to support subject matter. The instructor gives patients tips, tricks, and techniques to help them not only survive but enjoy the holidays despite the onslaught of food that accompanies the holidays.   Knowledge Questionnaire Score:  Knowledge Questionnaire Score - 01/18/21 1038      Knowledge Questionnaire Score   Pre Score 17/18            Core Components/Risk Factors/Patient Goals at Admission:  Personal Goals and Risk Factors at Admission - 01/18/21 1045      Core Components/Risk Factors/Patient Goals on Admission   Intervention Obesity: Provide education and appropriate resources to help participant work on and attain dietary goals.;Weight Management/Obesity: Establish reasonable short term and long term weight goals.;Weight Management: Develop a combined nutrition and exercise program designed to reach desired caloric intake, while maintaining appropriate intake of nutrient and fiber, sodium and fats, and appropriate energy expenditure required for the weight goal.;Weight Management: Provide education and appropriate resources to help participant work on and attain dietary goals.    Expected Outcomes Short Term: Continue to assess and modify interventions until short term weight is achieved;Long Term: Adherence to nutrition and physical activity/exercise program aimed toward attainment of established weight goal    Improve shortness of breath with ADL's Yes    Intervention Provide education, individualized exercise plan and daily activity instruction to help decrease symptoms of SOB with activities of daily living.    Expected Outcomes Short Term: Improve cardiorespiratory fitness to achieve a reduction of symptoms when performing ADLs;Long Term: Be able to perform more ADLs without symptoms or delay the onset of symptoms    Lipids Yes    Intervention Provide education and support for participant on nutrition & aerobic/resistive exercise along with prescribed medications to achieve LDL 70mg , HDL >40mg .  Expected Outcomes Short Term: Participant states understanding of desired cholesterol values and is compliant with medications prescribed. Participant is following exercise prescription and nutrition guidelines.;Long Term: Cholesterol controlled with medications as prescribed, with individualized exercise RX and with  personalized nutrition plan. Value goals: LDL < 70mg , HDL > 40 mg.           Core Components/Risk Factors/Patient Goals Review:    Core Components/Risk Factors/Patient Goals at Discharge (Final Review):    ITP Comments:   Comments:

## 2021-01-22 ENCOUNTER — Encounter (HOSPITAL_COMMUNITY)
Admission: RE | Admit: 2021-01-22 | Discharge: 2021-01-22 | Disposition: A | Discharge status: Home / Self Care | Payer: Medicare PPO | Source: Ambulatory Visit | Attending: Internal Medicine | Admitting: Internal Medicine

## 2021-01-22 ENCOUNTER — Other Ambulatory Visit: Payer: Self-pay

## 2021-01-22 VITALS — Wt 151.9 lb

## 2021-01-22 DIAGNOSIS — R053 Chronic cough: Secondary | ICD-10-CM

## 2021-01-22 DIAGNOSIS — J398 Other specified diseases of upper respiratory tract: Secondary | ICD-10-CM | POA: Diagnosis not present

## 2021-01-22 NOTE — Progress Notes (Signed)
Daily Session Note  Patient Details  Name: Kathryn Booker MRN: [id number] Date of Birth: 13-Nov-1950 Referring Provider:   April Manson Pulmonary Rehab Walk Test from 01/18/2021 in [place]  Referring Provider Dr. Chase Caller      Encounter Date: 01/22/2021  Check In:  Session Check In - 01/22/21 1442      Check-In   Supervising physician immediately available to respond to emergencies [facility] immediately available    Physician(s) Dr. Tawanna Solo    Location [facility]    Staff Present Rosebud Poles, RN, BSN;Jaysha Lasure Ysidro Evert, RN;Jessica Hassell Done, MS, ACSM-CEP, Exercise Physiologist    Virtual Visit No    Medication changes reported     No    Fall or balance concerns reported    No    Tobacco Cessation No Change    Warm-up and Cool-down Performed on first and last piece of equipment    Resistance Training Performed Yes    VAD Patient? No    PAD/SET Patient? No      Pain Assessment   Currently in Pain? No/denies    Multiple Pain Sites No           Capillary Blood Glucose: No results found for this or any previous visit (from the past 24 hour(s)).   Exercise Prescription Changes - 01/22/21 1500      Response to Exercise   Blood Pressure (Admit) 116/60    Blood Pressure (Exercise) 150/60    Blood Pressure (Exit) 112/70    Heart Rate (Admit) 60 bpm    Heart Rate (Exercise) 91 bpm    Heart Rate (Exit) 73 bpm    Oxygen Saturation (Admit) 97 %    Oxygen Saturation (Exercise) 96 %    Oxygen Saturation (Exit) 96 %    Rating of Perceived Exertion (Exercise) 11    Perceived Dyspnea (Exercise) 2    Duration Continue with 30 min of aerobic exercise without signs/symptoms of physical distress.    Intensity Other (comment)   40-80% of HRR     Progression   Progression Continue to progress workloads to maintain intensity without signs/symptoms of physical distress.      Resistance Training   Training Prescription Yes     Weight red bands    Reps 10-15    Time 10 Minutes      Treadmill   MPH 2    Grade 0    Minutes 15      Recumbant Bike   Level 1.5    Minutes 15    METs 2.3           Social History   Tobacco Use  Smoking Status Former Smoker  . Packs/day: 2.00  . Years: 35.00  . Pack years: 70.00  . Types: Cigarettes  . Quit date: 11/19/2013  . Years since quitting: 7.1  Smokeless Tobacco Never Used    Goals Met:  Exercise tolerated well No report of cardiac concerns or symptoms Strength training completed today  Goals Unmet:  Not Applicable  Comments: Service time is from 1300 to 1415    Dr. Fransico Him is Medical Director for Cardiac Rehab at [facility].

## 2021-01-24 ENCOUNTER — Encounter (HOSPITAL_COMMUNITY)
Admission: RE | Admit: 2021-01-24 | Discharge: 2021-01-24 | Disposition: A | Discharge status: Home / Self Care | Payer: Medicare PPO | Source: Ambulatory Visit | Attending: Internal Medicine | Admitting: Internal Medicine

## 2021-01-24 ENCOUNTER — Other Ambulatory Visit: Payer: Self-pay

## 2021-01-24 DIAGNOSIS — J398 Other specified diseases of upper respiratory tract: Secondary | ICD-10-CM | POA: Diagnosis not present

## 2021-01-24 DIAGNOSIS — R053 Chronic cough: Secondary | ICD-10-CM

## 2021-01-24 NOTE — Progress Notes (Signed)
Daily Session Note  Patient Details  Name: Kathryn Booker MRN: [id number] Date of Birth: 04-03-1951 Referring Provider:   April Manson Pulmonary Rehab Walk Test from 01/18/2021 in [place]  Referring Provider Dr. Chase Caller      Encounter Date: 01/24/2021  Check In:  Session Check In - 01/24/21 1438      Check-In   Supervising physician immediately available to respond to emergencies [facility] immediately available    Physician(s) Dr. Tawanna Solo    Location [facility]    Staff Present Rosebud Poles, RN, BSN;Aasha Dina Ysidro Evert, RN;Jessica Hassell Done, MS, ACSM-CEP, Exercise Physiologist    Virtual Visit No    Medication changes reported     No    Fall or balance concerns reported    No    Tobacco Cessation No Change    Warm-up and Cool-down Performed on first and last piece of equipment    Resistance Training Performed Yes    VAD Patient? No    PAD/SET Patient? No      Pain Assessment   Currently in Pain? No/denies    Multiple Pain Sites No           Capillary Blood Glucose: No results found for this or any previous visit (from the past 24 hour(s)).    Social History   Tobacco Use  Smoking Status Former Smoker  . Packs/day: 2.00  . Years: 35.00  . Pack years: 70.00  . Types: Cigarettes  . Quit date: 11/19/2013  . Years since quitting: 7.1  Smokeless Tobacco Never Used    Goals Met:  Exercise tolerated well No report of cardiac concerns or symptoms Strength training completed today  Goals Unmet:  Not Applicable  Comments: Service time is from 1300 to 1410    Dr. Fransico Him is Medical Director for Cardiac Rehab at [facility].

## 2021-01-28 ENCOUNTER — Other Ambulatory Visit: Payer: Self-pay | Admitting: Internal Medicine

## 2021-01-28 ENCOUNTER — Ambulatory Visit (INDEPENDENT_AMBULATORY_CARE_PROVIDER_SITE_OTHER): Payer: Medicare PPO

## 2021-01-28 DIAGNOSIS — I480 Paroxysmal atrial fibrillation: Secondary | ICD-10-CM | POA: Diagnosis not present

## 2021-01-29 ENCOUNTER — Other Ambulatory Visit: Payer: Self-pay

## 2021-01-29 ENCOUNTER — Encounter (HOSPITAL_COMMUNITY)
Admission: RE | Admit: 2021-01-29 | Discharge: 2021-01-29 | Disposition: A | Discharge status: Home / Self Care | Payer: Medicare PPO | Source: Ambulatory Visit | Attending: Internal Medicine | Admitting: Internal Medicine

## 2021-01-29 DIAGNOSIS — R053 Chronic cough: Secondary | ICD-10-CM | POA: Diagnosis not present

## 2021-01-29 DIAGNOSIS — J398 Other specified diseases of upper respiratory tract: Secondary | ICD-10-CM | POA: Diagnosis not present

## 2021-01-29 LAB — CUP PACEART REMOTE DEVICE CHECK
Date Time Interrogation Session: 20220421093534
Implantable Pulse Generator Implant Date: 20210208

## 2021-01-29 NOTE — Progress Notes (Signed)
Daily Session Note  Patient Details  Name: Kathryn Booker MRN: [id number] Date of Birth: 14-Jul-1951 Referring Provider:   April Manson Pulmonary Rehab Walk Test from 01/18/2021 in [place]  Referring Provider Dr. Chase Caller      Encounter Date: 01/29/2021  Check In:  Session Check In - 01/29/21 1403      Check-In   Supervising physician immediately available to respond to emergencies [facility] immediately available    Physician(s) Dr. Tawanna Solo    Location [facility]    Staff Present Rosebud Poles, RN, BSN;Lisa Ysidro Evert, RN;Jessica Hassell Done, MS, ACSM-CEP, Exercise Physiologist    Virtual Visit No    Medication changes reported     No    Fall or balance concerns reported    No    Tobacco Cessation No Change    Warm-up and Cool-down Performed on first and last piece of equipment    Resistance Training Performed Yes    VAD Patient? No    PAD/SET Patient? No      Pain Assessment   Currently in Pain? No/denies    Multiple Pain Sites No           Capillary Blood Glucose: No results found for this or any previous visit (from the past 24 hour(s)).    Social History   Tobacco Use  Smoking Status Former Smoker  . Packs/day: 2.00  . Years: 35.00  . Pack years: 70.00  . Types: Cigarettes  . Quit date: 11/19/2013  . Years since quitting: 7.2  Smokeless Tobacco Never Used    Goals Met:  Proper associated with RPD/PD & O2 Sat Exercise tolerated well Strength training completed today  Goals Unmet:  Not Applicable  Comments: Service time is from 1300 to 1410.    Dr. Fransico Him is Medical Director for Cardiac Rehab at [facility].

## 2021-01-29 NOTE — Progress Notes (Signed)
Pulmonary Individual Treatment Plan  Patient Details  Name: Kathryn Booker MRN: [id number] Date of Birth: 1951/05/15 Referring Provider:   April Manson Pulmonary Rehab Walk Test from 01/18/2021 in [place]  Referring Provider Dr. Chase Caller      Initial Encounter Date:  Flowsheet Row Pulmonary Rehab Walk Test from 01/18/2021 in [place]  Date 01/18/21      Visit Diagnosis: Chronic cough  Patient's Home Medications on Admission:   Current Outpatient Medications:  .  albuterol (PROVENTIL HFA;VENTOLIN HFA) 108 (90 Base) MCG/ACT inhaler, Use 2 inhalations 15 minutes apart every 4 hours to rescue Asthma, Disp: 3 Inhaler, Rfl: 3 .  azelastine (ASTELIN) 0.1 % nasal spray, SPRAY 2 SPRAYS INTO EACH NOSTRIL TWICE A DAY AS DIRECTED, Disp: 90 mL, Rfl: 3 .  buPROPion (WELLBUTRIN XL) 300 MG 24 hr tablet, Take 1 tablet every Morning or Mood, Focus & Concentration, Disp: 90 tablet, Rfl: 1 .  cetirizine (ZYRTEC) 10 MG tablet, Take 10 mg by mouth daily., Disp: , Rfl:  .  cholecalciferol (VITAMIN D3) 25 MCG (1000 UNIT) tablet, Take 1,000 Units by mouth daily. , Disp: , Rfl:  .  citalopram (CELEXA) 20 MG tablet, Take 20 mg by mouth daily., Disp: , Rfl:  .  COVID-19 mRNA vaccine, Pfizer, 30 MCG/0.3ML injection, USE AS DIRECTED, Disp: .3 mL, Rfl: 0 .  cyclobenzaprine (FLEXERIL) 10 MG tablet, Take 10 mg by mouth at bedtime as needed. , Disp: , Rfl:  .  diltiazem (CARDIZEM CD) 240 MG 24 hr capsule, Take 1 capsule (240 mg total) by mouth daily., Disp: 90 capsule, Rfl: 2 .  diltiazem (CARDIZEM) 30 MG tablet, Take 1 tablet Daily for  rapid Heart Beat (Patient taking differently: as needed. Take 1 tablet Daily for  rapid Heart Beat- only if HR is above 100 and top number b/p needs to be above 100), Disp: 90 tablet, Rfl: 1 .  ezetimibe (ZETIA) 10 MG tablet, TAKE 1 TABLET BY MOUTH EVERY DAY, Disp: 90 tablet, Rfl: 1 .  fluticasone (FLONASE) 50  MCG/ACT nasal spray, PLACE 1 SPRAY INTO EACH NOSTRIL ONCE DAILY, Disp: 48 g, Rfl: 3 .  ipratropium (ATROVENT) 0.06 % nasal spray, Place 2 sprays into both nostrils 2 (two) times daily., Disp: , Rfl:  .  magnesium oxide (MAG-OX) 400 MG tablet, Take 400 mg by mouth daily., Disp: , Rfl:  .  montelukast (SINGULAIR) 10 MG tablet, Take 10 mg by mouth at bedtime. , Disp: , Rfl:  .  Omega-3 Fatty Acids (FISH OIL) 1000 MG CAPS, Take 1 capsule by mouth., Disp: , Rfl:  .  OVER THE COUNTER MEDICATION, Eye drop- 1 drop each eye as needed. Equate dry eye, Disp: , Rfl:  .  rosuvastatin (CRESTOR) 5 MG tablet, Take 5 mg by mouth daily., Disp: , Rfl:  .  valACYclovir (VALTREX) 500 MG tablet, Take 1 tablet Daily for Prevention Fever Blisters, Disp: 90 tablet, Rfl: 3 .  WIXELA INHUB 250-50 MCG/DOSE AEPB, INHALE 1 PUFF INTO THE LUNGS TWICE A DAY**RINSE MOUTH AFTER INHALATION (Patient taking differently: 1 puff once.), Disp: 180 each, Rfl: 1 .  XARELTO 20 MG TABS tablet, TAKE 1 TABLET DAILY TO PREVENT BLOOD CLOTS, Disp: 90 tablet, Rfl: 0  Past Medical History: Past Medical History:  Diagnosis Date  . Allergies   . Anxiety   . Asthma   . COPD (chronic obstructive pulmonary disease) ([place])   . Coronary artery calcification seen on CT  scan   . Depression   . Fatigue   . Former tobacco use   . Hyperlipidemia   . Paroxysmal atrial fibrillation ([facility])   . Pre-diabetes   . RBBB (right bundle branch block with left anterior fascicular block)   . Scoliosis   . Serum calcium elevated   . Thyroid disease    "questionable" per patient    Tobacco Use: Social History   Tobacco Use  Smoking Status Former Smoker  . Packs/day: 2.00  . Years: 35.00  . Pack years: 70.00  . Types: Cigarettes  . Quit date: 11/19/2013  . Years since quitting: 7.2  Smokeless Tobacco Never Used    Labs: Recent Review Flowsheet Data    Labs for ITP Cardiac and Pulmonary Rehab Latest Ref Rng & Units 11/10/2016 02/17/2017 05/06/2017 11/06/2017  05/27/2018   Cholestrol <200 mg/dL 182 197 223(H) 221(H) 227(H)   LDLCALC mg/dL (calc) 76 128(H) 135(H) 131(H) 137(H)   HDL >50 mg/dL 52 41(L) 55 55 60   Trlycerides <150 mg/dL 270(H) 138 166(H) 213(H) 160(H)   Hemoglobin A1c <5.7 % of total Hgb 7.8(H) 5.9(H) 5.6 6.0(H) 5.9(H)      Capillary Blood Glucose: No results found for: GLUCAP   Pulmonary Assessment Scores:  Pulmonary Assessment Scores    Row Name 01/18/21 1038         ADL UCSD   ADL Phase Entry     SOB Score total 12           CAT Score   CAT Score 13           mMRC Score   mMRC Score 0           UCSD: Self-administered rating of dyspnea associated with activities of daily living (ADLs) 6-point scale (0 = "not at all" to 5 = "maximal or unable to do because of breathlessness")  Scoring Scores range from 0 to 120.  Minimally important difference is 5 units  CAT: CAT can identify the health impairment of COPD patients and is better correlated with disease progression.  CAT has a scoring range of zero to 40. The CAT score is classified into four groups of low (less than 10), medium (10 - 20), high (21-30) and very high (31-40) based on the impact level of disease on health status. A CAT score over 10 suggests significant symptoms.  A worsening CAT score could be explained by an exacerbation, poor medication adherence, poor inhaler technique, or progression of COPD or comorbid conditions.  CAT MCID is 2 points  mMRC: mMRC (Modified Medical Research Council) Dyspnea Scale is used to assess the degree of baseline functional disability in patients of respiratory disease due to dyspnea. No minimal important difference is established. A decrease in score of 1 point or greater is considered a positive change.   Pulmonary Function Assessment:   Exercise Target Goals: Exercise Program Goal: Individual exercise prescription set using results from initial 6 min walk test and THRR while considering  patient's activity  barriers and safety.   Exercise Prescription Goal: Initial exercise prescription builds to 30-45 minutes a day of aerobic activity, 2-3 days per week.  Home exercise guidelines will be given to patient during program as part of exercise prescription that the participant will acknowledge.  Activity Barriers & Risk Stratification:  Activity Barriers & Cardiac Risk Stratification - 01/18/21 0940      Activity Barriers & Cardiac Risk Stratification   Activity Barriers Shortness of Breath;Back Problems;Deconditioning    Cardiac  Risk Stratification Moderate           6 Minute Walk:  6 Minute Walk    Row Name 01/18/21 1049         6 Minute Walk   Phase Initial     Distance 1222 feet     Walk Time 6 minutes     # of Rest Breaks 0     MPH 2.31     METS 2.48     RPE 9     Perceived Dyspnea  1.5     VO2 Peak 8.69     Symptoms No     Resting HR 69 bpm     Resting BP 116/60     Resting Oxygen Saturation  96 %     Exercise Oxygen Saturation  during 6 min walk 95 %     Max Ex. HR 76 bpm     Max Ex. BP 122/66     2 Minute Post BP 120/60           Interval HR   1 Minute HR 72     2 Minute HR 72     3 Minute HR 72     4 Minute HR 76     5 Minute HR 76     6 Minute HR 76     2 Minute Post HR 57     Interval Heart Rate? Yes           Interval Oxygen   Interval Oxygen? Yes     Baseline Oxygen Saturation % 96 %     1 Minute Oxygen Saturation % 95 %     1 Minute Liters of Oxygen 0 L     2 Minute Oxygen Saturation % 95 %     2 Minute Liters of Oxygen 0 L     3 Minute Oxygen Saturation % 95 %     3 Minute Liters of Oxygen 0 L     4 Minute Oxygen Saturation % 95 %     4 Minute Liters of Oxygen 0 L     5 Minute Oxygen Saturation % 96 %     5 Minute Liters of Oxygen 0 L     6 Minute Oxygen Saturation % 96 %     6 Minute Liters of Oxygen 0 L     2 Minute Post Oxygen Saturation % 98 %     2 Minute Post Liters of Oxygen 0 L            Oxygen Initial Assessment:  Oxygen  Initial Assessment - 01/18/21 0942      Home Oxygen   Home Oxygen Device None    Sleep Oxygen Prescription CPAP    Home Exercise Oxygen Prescription None    Home Resting Oxygen Prescription None    Compliance with Home Oxygen Use Yes      Initial 6 min Walk   Oxygen Used None      Program Oxygen Prescription   Program Oxygen Prescription None      Intervention   Short Term Goals To learn and exhibit compliance with exercise, home and travel O2 prescription;To learn and understand importance of monitoring SPO2 with pulse oximeter and demonstrate accurate use of the pulse oximeter.;To learn and understand importance of maintaining oxygen saturations>88%;To learn and demonstrate proper pursed lip breathing techniques or other breathing techniques.;To learn and demonstrate proper use of respiratory medications    Long  Term Goals Exhibits  compliance with exercise, home and travel O2 prescription;Verbalizes importance of monitoring SPO2 with pulse oximeter and return demonstration;Maintenance of O2 saturations>88%;Exhibits proper breathing techniques, such as pursed lip breathing or other method taught during program session;Compliance with respiratory medication;Demonstrates proper use of MDI's           Oxygen Re-Evaluation:  Oxygen Re-Evaluation    Row Name 01/28/21 1547             Program Oxygen Prescription   Program Oxygen Prescription None               Home Oxygen   Home Oxygen Device None       Sleep Oxygen Prescription CPAP       Home Exercise Oxygen Prescription None       Home Resting Oxygen Prescription None       Compliance with Home Oxygen Use Yes               Goals/Expected Outcomes   Short Term Goals To learn and exhibit compliance with exercise, home and travel O2 prescription;To learn and understand importance of monitoring SPO2 with pulse oximeter and demonstrate accurate use of the pulse oximeter.;To learn and understand importance of maintaining oxygen  saturations>88%;To learn and demonstrate proper pursed lip breathing techniques or other breathing techniques.;To learn and demonstrate proper use of respiratory medications       Long  Term Goals Exhibits compliance with exercise, home and travel O2 prescription;Verbalizes importance of monitoring SPO2 with pulse oximeter and return demonstration;Maintenance of O2 saturations>88%;Exhibits proper breathing techniques, such as pursed lip breathing or other method taught during program session;Compliance with respiratory medication;Demonstrates proper use of MDI's       Goals/Expected Outcomes compliance and understanding of oxygen saturation and pursed lip breathing              Oxygen Discharge (Final Oxygen Re-Evaluation):  Oxygen Re-Evaluation - 01/28/21 1547      Program Oxygen Prescription   Program Oxygen Prescription None      Home Oxygen   Home Oxygen Device None    Sleep Oxygen Prescription CPAP    Home Exercise Oxygen Prescription None    Home Resting Oxygen Prescription None    Compliance with Home Oxygen Use Yes      Goals/Expected Outcomes   Short Term Goals To learn and exhibit compliance with exercise, home and travel O2 prescription;To learn and understand importance of monitoring SPO2 with pulse oximeter and demonstrate accurate use of the pulse oximeter.;To learn and understand importance of maintaining oxygen saturations>88%;To learn and demonstrate proper pursed lip breathing techniques or other breathing techniques.;To learn and demonstrate proper use of respiratory medications    Long  Term Goals Exhibits compliance with exercise, home and travel O2 prescription;Verbalizes importance of monitoring SPO2 with pulse oximeter and return demonstration;Maintenance of O2 saturations>88%;Exhibits proper breathing techniques, such as pursed lip breathing or other method taught during program session;Compliance with respiratory medication;Demonstrates proper use of MDI's     Goals/Expected Outcomes compliance and understanding of oxygen saturation and pursed lip breathing           Initial Exercise Prescription:  Initial Exercise Prescription - 01/18/21 1000      Date of Initial Exercise RX and Referring Provider   Date 01/18/21    Referring Provider Dr. Chase Caller    Expected Discharge Date 03/21/21      Treadmill   MPH 2    Grade 0    Minutes 15  Recumbant Bike   Level 1.5    RPM 60    Minutes 15      Prescription Details   Frequency (times per week) 2    Duration Progress to 30 minutes of continuous aerobic without signs/symptoms of physical distress      Intensity   THRR 40-80% of Max Heartrate 61-122    Ratings of Perceived Exertion 11-13    Perceived Dyspnea 0-4      Progression   Progression Continue to progress workloads to maintain intensity without signs/symptoms of physical distress.      Resistance Training   Training Prescription Yes    Weight Red bands    Reps 10-15           Perform Capillary Blood Glucose checks as needed.  Exercise Prescription Changes:  Exercise Prescription Changes    Row Name 01/22/21 1500             Response to Exercise   Blood Pressure (Admit) 116/60       Blood Pressure (Exercise) 150/60       Blood Pressure (Exit) 112/70       Heart Rate (Admit) 60 bpm       Heart Rate (Exercise) 91 bpm       Heart Rate (Exit) 73 bpm       Oxygen Saturation (Admit) 97 %       Oxygen Saturation (Exercise) 96 %       Oxygen Saturation (Exit) 96 %       Rating of Perceived Exertion (Exercise) 11       Perceived Dyspnea (Exercise) 2       Duration Continue with 30 min of aerobic exercise without signs/symptoms of physical distress.       Intensity Other (comment)  40-80% of HRR               Progression   Progression Continue to progress workloads to maintain intensity without signs/symptoms of physical distress.               Resistance Training   Training Prescription Yes       Weight  red bands       Reps 10-15       Time 10 Minutes               Treadmill   MPH 2       Grade 0       Minutes 15               Recumbant Bike   Level 1.5       Minutes 15       METs 2.3              Exercise Comments:  Exercise Comments    Row Name 01/24/21 0724           Exercise Comments Pt completed first day of exercise and tolerated well with no complaints or concerns.              Exercise Goals and Review:  Exercise Goals    Row Name 01/18/21 1036             Exercise Goals   Increase Physical Activity Yes       Intervention Provide advice, education, support and counseling about physical activity/exercise needs.;Develop an individualized exercise prescription for aerobic and resistive training based on initial evaluation findings, risk stratification, comorbidities and participant's personal goals.  Expected Outcomes Short Term: Attend rehab on a regular basis to increase amount of physical activity.;Long Term: Add in home exercise to make exercise part of routine and to increase amount of physical activity.;Long Term: Exercising regularly at least 3-5 days a week.       Increase Strength and Stamina Yes       Intervention Provide advice, education, support and counseling about physical activity/exercise needs.;Develop an individualized exercise prescription for aerobic and resistive training based on initial evaluation findings, risk stratification, comorbidities and participant's personal goals.       Expected Outcomes Short Term: Increase workloads from initial exercise prescription for resistance, speed, and METs.;Short Term: Perform resistance training exercises routinely during rehab and add in resistance training at home;Long Term: Improve cardiorespiratory fitness, muscular endurance and strength as measured by increased METs and functional capacity (6MWT)       Able to understand and use rate of perceived exertion (RPE) scale Yes        Intervention Provide education and explanation on how to use RPE scale       Expected Outcomes Short Term: Able to use RPE daily in rehab to express subjective intensity level;Long Term:  Able to use RPE to guide intensity level when exercising independently       Able to understand and use Dyspnea scale Yes       Intervention Provide education and explanation on how to use Dyspnea scale       Expected Outcomes Short Term: Able to use Dyspnea scale daily in rehab to express subjective sense of shortness of breath during exertion;Long Term: Able to use Dyspnea scale to guide intensity level when exercising independently       Knowledge and understanding of Target Heart Rate Range (THRR) Yes       Intervention Provide education and explanation of THRR including how the numbers were predicted and where they are located for reference       Expected Outcomes Short Term: Able to state/look up THRR;Short Term: Able to use daily as guideline for intensity in rehab;Long Term: Able to use THRR to govern intensity when exercising independently       Understanding of Exercise Prescription Yes       Intervention Provide education, explanation, and written materials on patient's individual exercise prescription       Expected Outcomes Short Term: Able to explain program exercise prescription;Long Term: Able to explain home exercise prescription to exercise independently              Exercise Goals Re-Evaluation :  Exercise Goals Re-Evaluation    [place] Name 01/28/21 1541             Exercise Goal Re-Evaluation   Exercise Goals Review Increase Physical Activity;Increase Strength and Stamina;Able to understand and use rate of perceived exertion (RPE) scale;Able to understand and use Dyspnea scale;Knowledge and understanding of Target Heart Rate Range (THRR);Understanding of Exercise Prescription       Comments The pt has completed 2 exercise sessions and has tolerated well so far with no complaints or  concerns. It is too early to note any progressions, will continue to monitor and progress as she is able. She is exercising at 2.2 METS on the bike and 2.5 METS on the treadmill.       Expected Outcomes Through exercise at rehab and home, the patient will decrease shortness of breath with daily activities and feel confident in carrying out an exercise regimn at home.  Discharge Exercise Prescription (Final Exercise Prescription Changes):  Exercise Prescription Changes - 01/22/21 1500      Response to Exercise   Blood Pressure (Admit) 116/60    Blood Pressure (Exercise) 150/60    Blood Pressure (Exit) 112/70    Heart Rate (Admit) 60 bpm    Heart Rate (Exercise) 91 bpm    Heart Rate (Exit) 73 bpm    Oxygen Saturation (Admit) 97 %    Oxygen Saturation (Exercise) 96 %    Oxygen Saturation (Exit) 96 %    Rating of Perceived Exertion (Exercise) 11    Perceived Dyspnea (Exercise) 2    Duration Continue with 30 min of aerobic exercise without signs/symptoms of physical distress.    Intensity Other (comment)   40-80% of HRR     Progression   Progression Continue to progress workloads to maintain intensity without signs/symptoms of physical distress.      Resistance Training   Training Prescription Yes    Weight red bands    Reps 10-15    Time 10 Minutes      Treadmill   MPH 2    Grade 0    Minutes 15      Recumbant Bike   Level 1.5    Minutes 15    METs 2.3           Nutrition:  Target Goals: Understanding of nutrition guidelines, daily intake of sodium <1571m, cholesterol <2020m calories 30% from fat and 7% or less from saturated fats, daily to have 5 or more servings of fruits and vegetables.  Biometrics:  Pre Biometrics - 01/18/21 1037      Pre Biometrics   Grip Strength 19 kg            Nutrition Therapy Plan and Nutrition Goals:  Nutrition Therapy & Goals - 01/25/21 0957      Nutrition Therapy   RD appointment deferred Yes            Nutrition Assessments:  MEDIFICTS Score Key:  ?70 Need to make dietary changes   40-70 Heart Healthy Diet  ? 40 Therapeutic Level Cholesterol Diet   Picture Your Plate Scores:  <4<03nhealthy dietary pattern with much room for improvement.  41-50 Dietary pattern unlikely to meet recommendations for good health and room for improvement.  51-60 More healthful dietary pattern, with some room for improvement.   >60 Healthy dietary pattern, although there may be some specific behaviors that could be improved.    Nutrition Goals Re-Evaluation:   Nutrition Goals Discharge (Final Nutrition Goals Re-Evaluation):   Psychosocial: Target Goals: Acknowledge presence or absence of significant depression and/or stress, maximize coping skills, provide positive support system. Participant is able to verbalize types and ability to use techniques and skills needed for reducing stress and depression.  Initial Review & Psychosocial Screening:  Initial Psych Review & Screening - 01/18/21 1040      Screening Interventions   Expected Outcomes Long Term Goal: Stressors or current issues are controlled or eliminated.           Quality of Life Scores:  Scores of 19 and below usually indicate a poorer quality of life in these areas.  A difference of  2-3 points is a clinically meaningful difference.  A difference of 2-3 points in the total score of the Quality of Life Index has been associated with significant improvement in overall quality of life, self-image, physical symptoms, and general health in studies assessing change in quality of life.  PHQ-9: Recent Review Flowsheet Data    Depression screen [facility] 2/9 01/18/2021 01/18/2021 02/02/2019 08/07/2016   Decreased Interest - 0 0 0   Down, Depressed, Hopeless 0 0 0 0   PHQ - 2 Score 0 0 0 0   Altered sleeping 0 - - -   Tired, decreased energy 1 - - -   Change in appetite 0 - - -   Feeling bad or failure about yourself  0 - - -   Trouble  concentrating 0 - - -   Moving slowly or fidgety/restless 0 - - -   Suicidal thoughts 0 - - -   PHQ-9 Score 1 - - -   Difficult doing work/chores Not difficult at all - - -     Interpretation of Total Score  Total Score Depression Severity:  1-4 = Minimal depression, 5-9 = Mild depression, 10-14 = Moderate depression, 15-19 = Moderately severe depression, 20-27 = Severe depression   Psychosocial Evaluation and Intervention:  Psychosocial Evaluation - 01/18/21 1041      Psychosocial Evaluation & Interventions   Interventions Encouraged to exercise with the program and follow exercise prescription    Comments Pt has a history of depression and is on medication treatment that controls her depression. She states she does not experience depression on the medication. She does not have any family but has close friends and neighbors that she considers family.    Expected Outcomes Imrpove quality of life and maintain decreased levels of stress and depression.    Continue Psychosocial Services  No Follow up required           Psychosocial Re-Evaluation:  Psychosocial Re-Evaluation    [place] Name 01/24/21 0815             Psychosocial Re-Evaluation   Current issues with History of Depression       Comments No psychosocial concerns identified at this time, no change in psychosocial since orientation.       Expected Outcomes For Kathryn Booker to continue to be free of psychosocial concerns while participating in pulmonary rehab.       Interventions Encouraged to attend Pulmonary Rehabilitation for the exercise;Stress management education       Continue Psychosocial Services  No Follow up required              Psychosocial Discharge (Final Psychosocial Re-Evaluation):  Psychosocial Re-Evaluation - 01/24/21 0815      Psychosocial Re-Evaluation   Current issues with History of Depression    Comments No psychosocial concerns identified at this time, no change in psychosocial since orientation.     Expected Outcomes For Kathryn Booker to continue to be free of psychosocial concerns while participating in pulmonary rehab.    Interventions Encouraged to attend Pulmonary Rehabilitation for the exercise;Stress management education    Continue Psychosocial Services  No Follow up required           Education: Education Goals: Education classes will be provided on a weekly basis, covering required topics. Participant will state understanding/return demonstration of topics presented.  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 Wears glasses   Learning Preferences Skilled Demonstration;Individual Instruction;Verbal Instruction           Education Topics: Risk Factor Reduction:  -Group instruction that is supported by a PowerPoint presentation. Instructor discusses the definition of a risk factor, different  risk factors for pulmonary disease, and how the heart and lungs work together.     Nutrition for Pulmonary Patient:  -Group instruction provided by PowerPoint slides, verbal discussion, and written materials to support subject matter. The instructor gives an explanation and review of healthy diet recommendations, which includes a discussion on weight management, recommendations for fruit and vegetable consumption, as well as protein, fluid, caffeine, fiber, sodium, sugar, and alcohol. Tips for eating when patients are short of breath are discussed.   Pursed Lip Breathing:  -Group instruction that is supported by demonstration and informational handouts. Instructor discusses the benefits of pursed lip and diaphragmatic breathing and detailed demonstration on how to preform both.     Oxygen Safety:  -Group instruction provided by PowerPoint, verbal discussion, and written material to support subject matter. There is an overview of "What is  Oxygen" and "Why do we need it".  Instructor also reviews how to create a safe environment for oxygen use, the importance of using oxygen as prescribed, and the risks of noncompliance. There is a brief discussion on traveling with oxygen and resources the patient may utilize.   Oxygen Equipment:  -Group instruction provided by [facility] Staff utilizing handouts, written materials, and equipment demonstrations.   Signs and Symptoms:  -Group instruction provided by written material and verbal discussion to support subject matter. Warning signs and symptoms of infection, stroke, and heart attack are reviewed and when to call the physician/911 reinforced. Tips for preventing the spread of infection discussed.   Advanced Directives:  -Group instruction provided by verbal instruction and written material to support subject matter. Instructor reviews Advanced Directive laws and proper instruction for filling out document.   Pulmonary Video:  -Group video education that reviews the importance of medication and oxygen compliance, exercise, good nutrition, pulmonary hygiene, and pursed lip and diaphragmatic breathing for the pulmonary patient.   Exercise for the Pulmonary Patient:  -Group instruction that is supported by a PowerPoint presentation. Instructor discusses benefits of exercise, core components of exercise, frequency, duration, and intensity of an exercise routine, importance of utilizing pulse oximetry during exercise, safety while exercising, and options of places to exercise outside of rehab.     Pulmonary Medications:  -Verbally interactive group education provided by instructor with focus on inhaled medications and proper administration.   Anatomy and Physiology of the Respiratory System and Intimacy:  -Group instruction provided by PowerPoint, verbal discussion, and written material to support subject matter. Instructor reviews respiratory cycle and anatomical components of the  respiratory system and their functions. Instructor also reviews differences in obstructive and restrictive respiratory diseases with examples of each. Intimacy, Sex, and Sexuality differences are reviewed with a discussion on how relationships can change when diagnosed with pulmonary disease. Common sexual concerns are reviewed.   MD DAY -A group question and answer session with a medical doctor that allows participants to ask questions that relate to their pulmonary disease state.   OTHER EDUCATION -Group or individual verbal, written, or video instructions that support the educational goals of the pulmonary rehab program. [place] from 01/24/2021 in [place]  Date 01/24/21  Educator Handout  Jeannie Fend my plate]      Holiday Eating Survival Tips:  -Group instruction provided by PowerPoint slides, verbal discussion, and written materials to support subject matter. The instructor gives patients tips, tricks, and techniques to help them not only survive but enjoy the holidays despite the onslaught of food that accompanies the  holidays.   Knowledge Questionnaire Score:  Knowledge Questionnaire Score - 01/18/21 1038      Knowledge Questionnaire Score   Pre Score 17/18           Core Components/Risk Factors/Patient Goals at Admission:  Personal Goals and Risk Factors at Admission - 01/18/21 1045      Core Components/Risk Factors/Patient Goals on Admission   Intervention Obesity: Provide education and appropriate resources to help participant work on and attain dietary goals.;Weight Management/Obesity: Establish reasonable short term and long term weight goals.;Weight Management: Develop a combined nutrition and exercise program designed to reach desired caloric intake, while maintaining appropriate intake of nutrient and fiber, sodium and fats, and appropriate energy expenditure required for the weight goal.;Weight  Management: Provide education and appropriate resources to help participant work on and attain dietary goals.    Expected Outcomes Short Term: Continue to assess and modify interventions until short term weight is achieved;Long Term: Adherence to nutrition and physical activity/exercise program aimed toward attainment of established weight goal    Improve shortness of breath with ADL's Yes    Intervention Provide education, individualized exercise plan and daily activity instruction to help decrease symptoms of SOB with activities of daily living.    Expected Outcomes Short Term: Improve cardiorespiratory fitness to achieve a reduction of symptoms when performing ADLs;Long Term: Be able to perform more ADLs without symptoms or delay the onset of symptoms    Lipids Yes    Intervention Provide education and support for participant on nutrition & aerobic/resistive exercise along with prescribed medications to achieve LDL <8m, HDL >442m    Expected Outcomes Short Term: Participant states understanding of desired cholesterol values and is compliant with medications prescribed. Participant is following exercise prescription and nutrition guidelines.;Long Term: Cholesterol controlled with medications as prescribed, with individualized exercise RX and with personalized nutrition plan. Value goals: LDL < 7058mHDL > 40 mg.           Core Components/Risk Factors/Patient Goals Review:   Goals and Risk Factor Review    Row Name 01/24/21 0817             Core Components/Risk Factors/Patient Goals Review   Personal Goals Review Develop more efficient breathing techniques such as purse lipped breathing and diaphragmatic breathing and practicing self-pacing with activity.;Increase knowledge of respiratory medications and ability to use respiratory devices properly.;Improve shortness of breath with ADL's       Review Just started pulmonary rehab, has attended 1 exercise session, too early to see progression  toward program goals.       Expected Outcomes See admission goals              Core Components/Risk Factors/Patient Goals at Discharge (Final Review):   Goals and Risk Factor Review - 01/24/21 0817      Core Components/Risk Factors/Patient Goals Review   Personal Goals Review Develop more efficient breathing techniques such as purse lipped breathing and diaphragmatic breathing and practicing self-pacing with activity.;Increase knowledge of respiratory medications and ability to use respiratory devices properly.;Improve shortness of breath with ADL's    Review Just started pulmonary rehab, has attended 1 exercise session, too early to see progression toward program goals.    Expected Outcomes See admission goals           ITP Comments:   Comments: ITP REVIEW Pt is making expected progress toward pulmonary rehab goals after completing 2 sessions. Recommend continued exercise, life style modification, education, and utilization of breathing techniques to  increase stamina and strength and decrease shortness of breath with exertion.

## 2021-01-30 DIAGNOSIS — H25013 Cortical age-related cataract, bilateral: Secondary | ICD-10-CM | POA: Diagnosis not present

## 2021-01-30 DIAGNOSIS — H2513 Age-related nuclear cataract, bilateral: Secondary | ICD-10-CM | POA: Diagnosis not present

## 2021-01-30 DIAGNOSIS — R7303 Prediabetes: Secondary | ICD-10-CM | POA: Diagnosis not present

## 2021-01-30 DIAGNOSIS — H5202 Hypermetropia, left eye: Secondary | ICD-10-CM | POA: Diagnosis not present

## 2021-01-31 ENCOUNTER — Other Ambulatory Visit: Payer: Self-pay

## 2021-01-31 ENCOUNTER — Encounter (HOSPITAL_COMMUNITY)
Admission: RE | Admit: 2021-01-31 | Discharge: 2021-01-31 | Disposition: A | Discharge status: Home / Self Care | Payer: Medicare PPO | Source: Ambulatory Visit | Attending: Internal Medicine | Admitting: Internal Medicine

## 2021-01-31 DIAGNOSIS — R053 Chronic cough: Secondary | ICD-10-CM

## 2021-01-31 DIAGNOSIS — J398 Other specified diseases of upper respiratory tract: Secondary | ICD-10-CM | POA: Diagnosis not present

## 2021-01-31 NOTE — Progress Notes (Signed)
Daily Session Note  Patient Details  Name: Kathryn Booker MRN: [id number] Date of Birth: 1951/01/10 Referring Provider:   April Manson Pulmonary Rehab Walk Test from 01/18/2021 in [place]  Referring Provider Dr. Chase Caller      Encounter Date: 01/31/2021  Check In:  Session Check In - 01/31/21 1311      Check-In   Supervising physician immediately available to respond to emergencies [facility] immediately available    Physician(s) Dr. Kurtis Bushman    Location [facility]    Staff Present Rosebud Poles, RN, BSN;Cassidie Veiga Ysidro Evert, RN;David [place], MS, ACSM-CEP, CCRP, Exercise Physiologist    Virtual Visit No    Medication changes reported     No    Fall or balance concerns reported    No    Tobacco Cessation No Change    Warm-up and Cool-down Performed on first and last piece of equipment    Resistance Training Performed Yes    VAD Patient? No    PAD/SET Patient? No      Pain Assessment   Currently in Pain? No/denies    Multiple Pain Sites No           Capillary Blood Glucose: No results found for this or any previous visit (from the past 24 hour(s)).    Social History   Tobacco Use  Smoking Status Former Smoker  . Packs/day: 2.00  . Years: 35.00  . Pack years: 70.00  . Types: Cigarettes  . Quit date: 11/19/2013  . Years since quitting: 7.2  Smokeless Tobacco Never Used    Goals Met:  Exercise tolerated well No report of cardiac concerns or symptoms Strength training completed today  Goals Unmet:  Not Applicable  Comments: Service time is from 1300 to 1404    Dr. Fransico Him is Medical Director for Cardiac Rehab at [facility].

## 2021-02-05 ENCOUNTER — Ambulatory Visit: Payer: Medicare Other | Admitting: Physician Assistant

## 2021-02-05 ENCOUNTER — Other Ambulatory Visit: Payer: Self-pay

## 2021-02-05 ENCOUNTER — Encounter (HOSPITAL_COMMUNITY)
Admission: RE | Admit: 2021-02-05 | Discharge: 2021-02-05 | Disposition: A | Discharge status: Home / Self Care | Payer: Medicare PPO | Source: Ambulatory Visit | Attending: Internal Medicine | Admitting: Internal Medicine

## 2021-02-05 VITALS — Wt 141.1 lb

## 2021-02-05 DIAGNOSIS — J398 Other specified diseases of upper respiratory tract: Secondary | ICD-10-CM | POA: Diagnosis not present

## 2021-02-05 DIAGNOSIS — R053 Chronic cough: Secondary | ICD-10-CM | POA: Insufficient documentation

## 2021-02-05 NOTE — Progress Notes (Signed)
Kathryn Booker 70 y.o. female Nutrition Note  Diagnosis: Chronic Cough  Past Medical History:  Diagnosis Date  . Allergies   . Anxiety   . Asthma   . COPD (chronic obstructive pulmonary disease) ([place])   . Coronary artery calcification seen on CT scan   . Depression   . Fatigue   . Former tobacco use   . Hyperlipidemia   . Paroxysmal atrial fibrillation (HCC)   . Pre-diabetes   . RBBB (right bundle branch block with left anterior fascicular block)   . Scoliosis   . Serum calcium elevated   . Thyroid disease    "questionable" per patient     Medications reviewed.   Current Outpatient Medications:  .  albuterol (PROVENTIL HFA;VENTOLIN HFA) 108 (90 Base) MCG/ACT inhaler, Use 2 inhalations 15 minutes apart every 4 hours to rescue Asthma, Disp: 3 Inhaler, Rfl: 3 .  azelastine (ASTELIN) 0.1 % nasal spray, SPRAY 2 SPRAYS INTO EACH NOSTRIL TWICE A DAY AS DIRECTED, Disp: 90 mL, Rfl: 3 .  buPROPion (WELLBUTRIN XL) 300 MG 24 hr tablet, Take 1 tablet every Morning or Mood, Focus & Concentration, Disp: 90 tablet, Rfl: 1 .  cetirizine (ZYRTEC) 10 MG tablet, Take 10 mg by mouth daily., Disp: , Rfl:  .  cholecalciferol (VITAMIN D3) 25 MCG (1000 UNIT) tablet, Take 1,000 Units by mouth daily. , Disp: , Rfl:  .  citalopram (CELEXA) 20 MG tablet, Take 20 mg by mouth daily., Disp: , Rfl:  .  COVID-19 mRNA vaccine, Pfizer, 30 MCG/0.3ML injection, USE AS DIRECTED, Disp: .3 mL, Rfl: 0 .  cyclobenzaprine (FLEXERIL) 10 MG tablet, Take 10 mg by mouth at bedtime as needed. , Disp: , Rfl:  .  diltiazem (CARDIZEM CD) 240 MG 24 hr capsule, Take 1 capsule (240 mg total) by mouth daily., Disp: 90 capsule, Rfl: 2 .  diltiazem (CARDIZEM) 30 MG tablet, Take 1 tablet Daily for  rapid Heart Beat (Patient taking differently: as needed. Take 1 tablet Daily for  rapid Heart Beat- only if HR is above 100 and top number b/p needs to be above 100), Disp: 90 tablet, Rfl: 1 .  ezetimibe (ZETIA) 10 MG tablet, TAKE 1  TABLET BY MOUTH EVERY DAY, Disp: 90 tablet, Rfl: 1 .  fluticasone (FLONASE) 50 MCG/ACT nasal spray, PLACE 1 SPRAY INTO EACH NOSTRIL ONCE DAILY, Disp: 48 g, Rfl: 3 .  ipratropium (ATROVENT) 0.06 % nasal spray, Place 2 sprays into both nostrils 2 (two) times daily., Disp: , Rfl:  .  magnesium oxide (MAG-OX) 400 MG tablet, Take 400 mg by mouth daily., Disp: , Rfl:  .  montelukast (SINGULAIR) 10 MG tablet, Take 10 mg by mouth at bedtime. , Disp: , Rfl:  .  Omega-3 Fatty Acids (FISH OIL) 1000 MG CAPS, Take 1 capsule by mouth., Disp: , Rfl:  .  OVER THE COUNTER MEDICATION, Eye drop- 1 drop each eye as needed. Equate dry eye, Disp: , Rfl:  .  rosuvastatin (CRESTOR) 5 MG tablet, Take 5 mg by mouth daily., Disp: , Rfl:  .  valACYclovir (VALTREX) 500 MG tablet, Take 1 tablet Daily for Prevention Fever Blisters, Disp: 90 tablet, Rfl: 3 .  WIXELA INHUB 250-50 MCG/DOSE AEPB, INHALE 1 PUFF INTO THE LUNGS TWICE A DAY**RINSE MOUTH AFTER INHALATION (Patient taking differently: 1 puff once.), Disp: 180 each, Rfl: 1 .  XARELTO 20 MG TABS tablet, TAKE 1 TABLET DAILY TO PREVENT BLOOD CLOTS, Disp: 90 tablet, Rfl: 0   Ht Readings from  Last 1 Encounters:  01/18/21 5\' 2"  (1.575 m)     Wt Readings from Last 3 Encounters:  01/22/21 151 lb 14.4 oz (68.9 kg)  01/18/21 151 lb 7.3 oz (68.7 kg)  01/16/21 150 lb (68 kg)     There is no height or weight on file to calculate BMI.   Social History   Tobacco Use  Smoking Status Former Smoker  . Packs/day: 2.00  . Years: 35.00  . Pack years: 70.00  . Types: Cigarettes  . Quit date: 11/19/2013  . Years since quitting: 7.2  Smokeless Tobacco Never Used      Nutrition Note  Spoke with pt. Nutrition Plan and Nutrition Survey goals reviewed with pt.   Pt has Pre-diabetes. Last A1c indicates blood glucose well-controlled  Pt nutrition concerns are preventing diabetes progression and weight loss. She feels she has already received adequate education on both these  topics and has already set personal goals. She saw diabetes RD years ago after prediabetes.  We discussed exercise history. She reports living active lifestyle and walking for exercise.   Pt expressed understanding of the information reviewed.    Nutrition Diagnosis ? Food-and nutrition-related knowledge deficit related to lack of exposure to information as related to diagnosis of: ? Pre-diabetes  Nutrition Intervention ? Pt's individual nutrition plan reviewed with pt. ? Benefits of adopting healthy diet reviewed with Rate My Plate survey   ? Continue client-centered nutrition education by RD, as part of interdisciplinary care.  Goal(s)  ? Pt to continue building a healthy plate including vegetables, fruits, whole grains, and low-fat dairy products in a heart healthy meal plan.  Plan:   Will provide client-centered nutrition education as part of interdisciplinary care  Monitor and evaluate progress toward nutrition goal with team.   Michaele Offer, MS, RDN, LDN

## 2021-02-05 NOTE — Progress Notes (Signed)
Daily Session Note  Patient Details  Name: Kathryn Booker MRN: [id number] Date of Birth: Apr 16, 1951 Referring Provider:   April Manson Pulmonary Rehab Walk Test from 01/18/2021 in [place]  Referring Provider Dr. Chase Caller      Encounter Date: 02/05/2021  Check In:  Session Check In - 02/05/21 1416      Check-In   Supervising physician immediately available to respond to emergencies [facility] immediately available    Physician(s) Dr. Kurtis Bushman    [facility]    Staff Present Rosebud Poles, RN, BSN;Lisa Ysidro Evert, RN;Olinty Celesta Aver, MS, ACSM CEP, Exercise Physiologist;Jessica Hassell Done, MS, ACSM-CEP, Exercise Physiologist;Carlette Wilber Oliphant, RN, BSN;Ramon Dredge, RN, MHA    Virtual Visit No    Medication changes reported     No    Fall or balance concerns reported    No    Tobacco Cessation No Change    Warm-up and Cool-down Performed as group-led instruction    Resistance Training Performed Yes    VAD Patient? No    PAD/SET Patient? No      Pain Assessment   Currently in Pain? No/denies    Multiple Pain Sites No           Capillary Blood Glucose: No results found for this or any previous visit (from the past 24 hour(s)).   Exercise Prescription Changes - 02/05/21 1500      Response to Exercise   Blood Pressure (Admit) 104/62    Blood Pressure (Exit) 112/62    Heart Rate (Admit) 65 bpm    Heart Rate (Exercise) 87 bpm    Heart Rate (Exit) 62 bpm    Oxygen Saturation (Admit) 98 %    Oxygen Saturation (Exercise) 97 %    Oxygen Saturation (Exit) 97 %    Rating of Perceived Exertion (Exercise) 13    Perceived Dyspnea (Exercise) 2    Duration Continue with 30 min of aerobic exercise without signs/symptoms of physical distress.    Intensity THRR unchanged      Progression   Progression Continue to progress workloads to maintain intensity without signs/symptoms of physical distress.      Resistance  Training   Training Prescription Yes    Weight red bands    Reps 10-15    Time 10 Minutes      Treadmill   MPH 2    Grade 1    Minutes 15      Recumbant Bike   Level 1.5    Minutes 15    METs 2.3           Social History   Tobacco Use  Smoking Status Former Smoker  . Packs/day: 2.00  . Years: 35.00  . Pack years: 70.00  . Types: Cigarettes  . Quit date: 11/19/2013  . Years since quitting: 7.2  Smokeless Tobacco Never Used    Goals Met:  Proper associated with RPD/PD & O2 Sat Exercise tolerated well Strength training completed today  Goals Unmet:  Not Applicable  Comments: Service time is from 1315 to 1422    Dr. Fransico Him is Medical Director for Cardiac Rehab at [facility].

## 2021-02-07 ENCOUNTER — Encounter (HOSPITAL_COMMUNITY)
Admission: RE | Admit: 2021-02-07 | Discharge: 2021-02-07 | Disposition: A | Discharge status: Home / Self Care | Payer: Medicare PPO | Source: Ambulatory Visit | Attending: Internal Medicine | Admitting: Internal Medicine

## 2021-02-07 ENCOUNTER — Other Ambulatory Visit: Payer: Self-pay

## 2021-02-07 DIAGNOSIS — R053 Chronic cough: Secondary | ICD-10-CM | POA: Diagnosis not present

## 2021-02-07 DIAGNOSIS — J398 Other specified diseases of upper respiratory tract: Secondary | ICD-10-CM | POA: Diagnosis not present

## 2021-02-07 NOTE — Progress Notes (Signed)
Daily Session Note  Patient Details  Name: Kathryn Booker MRN: [id number] Date of Birth: 1950/10/31 Referring Provider:   April Manson Pulmonary Rehab Walk Test from 01/18/2021 in [place]  Referring Provider Dr. Chase Caller      Encounter Date: 02/07/2021  Check In:  Session Check In - 02/07/21 1406      Check-In   Supervising physician immediately available to respond to emergencies [facility] immediately available    Physician(s) Dr. Arbutus Ped    [facility]    Staff Present Rosebud Poles, RN, BSN;Marilynne Dupuis Ysidro Evert, RN;Jessica Hassell Done, MS, ACSM-CEP, Exercise Physiologist;Jetta Gilford Rile BS, ACSM EP-C, Exercise Physiologist    Virtual Visit No    Medication changes reported     No    Fall or balance concerns reported    No    Tobacco Cessation No Change    Warm-up and Cool-down Performed as group-led instruction    Resistance Training Performed Yes    VAD Patient? No    PAD/SET Patient? No      Pain Assessment   Currently in Pain? No/denies    Multiple Pain Sites No           Capillary Blood Glucose: No results found for this or any previous visit (from the past 24 hour(s)).    Social History   Tobacco Use  Smoking Status Former Smoker  . Packs/day: 2.00  . Years: 35.00  . Pack years: 70.00  . Types: Cigarettes  . Quit date: 11/19/2013  . Years since quitting: 7.2  Smokeless Tobacco Never Used    Goals Met:  Exercise tolerated well No report of cardiac concerns or symptoms Strength training completed today  Goals Unmet:  Not Applicable  Comments: Service time is from 1315 to 1425    Dr. Fransico Him is Medical Director for Cardiac Rehab at [facility].

## 2021-02-12 ENCOUNTER — Other Ambulatory Visit: Payer: Self-pay

## 2021-02-12 ENCOUNTER — Encounter (HOSPITAL_COMMUNITY)
Admission: RE | Admit: 2021-02-12 | Discharge: 2021-02-12 | Disposition: A | Discharge status: Home / Self Care | Payer: Medicare PPO | Source: Ambulatory Visit | Attending: Internal Medicine | Admitting: Internal Medicine

## 2021-02-12 DIAGNOSIS — R053 Chronic cough: Secondary | ICD-10-CM

## 2021-02-12 NOTE — Progress Notes (Signed)
Kathryn Booker arrived at pulmonary rehab to exercise today, she has known atrial fib and her heart rate was 115-122 at rest.  BP 130/72, spo2 98% on RA.  She has diltiazem 30 mg at home to take when this occurs, she was sent home without exercising. She was in no acute distress, did report feeling "hot" today and felt like she was in a fib.

## 2021-02-14 ENCOUNTER — Other Ambulatory Visit: Payer: Self-pay

## 2021-02-14 ENCOUNTER — Encounter (HOSPITAL_COMMUNITY)
Admission: RE | Admit: 2021-02-14 | Discharge: 2021-02-14 | Disposition: A | Discharge status: Home / Self Care | Payer: Medicare PPO | Source: Ambulatory Visit | Attending: Internal Medicine | Admitting: Internal Medicine

## 2021-02-14 DIAGNOSIS — R053 Chronic cough: Secondary | ICD-10-CM | POA: Diagnosis not present

## 2021-02-14 DIAGNOSIS — J398 Other specified diseases of upper respiratory tract: Secondary | ICD-10-CM | POA: Diagnosis not present

## 2021-02-14 NOTE — Progress Notes (Signed)
Carelink Summary Report / Loop Recorder 

## 2021-02-14 NOTE — Progress Notes (Signed)
Daily Session Note  Patient Details  Name: Kathryn Booker MRN: [id number] Date of Birth: March 30, 1951 Referring Provider:   April Manson Pulmonary Rehab Walk Test from 01/18/2021 in [place]  Referring Provider Dr. Chase Caller      Encounter Date: 02/14/2021  Check In:  Session Check In - 02/14/21 1449      Check-In   Supervising physician immediately available to respond to emergencies [facility] immediately available    Physician(s) Dr. Lonny Prude    [facility]    Staff Present Rosebud Poles, RN, Isaac Laud, MS, ACSM-CEP, Exercise Physiologist;Annedrea Rosezella [place], RN, MHA;Carlette Wilber Oliphant, RN, Milus Glazier, MS, ACSM-CEP, CCRP, Exercise Physiologist    Virtual Visit No    Medication changes reported     No    Fall or balance concerns reported    No    Tobacco Cessation No Change    Warm-up and Cool-down Performed as group-led instruction    Resistance Training Performed Yes    VAD Patient? No    PAD/SET Patient? No      Pain Assessment   Currently in Pain? No/denies    Multiple Pain Sites No           Capillary Blood Glucose: No results found for this or any previous visit (from the past 24 hour(s)).    Social History   Tobacco Use  Smoking Status Former Smoker  . Packs/day: 2.00  . Years: 35.00  . Pack years: 70.00  . Types: Cigarettes  . Quit date: 11/19/2013  . Years since quitting: 7.2  Smokeless Tobacco Never Used    Goals Met:  Proper associated with RPD/PD & O2 Sat Exercise tolerated well Strength training completed today  Goals Unmet:  Not Applicable  Comments: Service time is from 1315 to 31    Dr. Fransico Him is Medical Director for Cardiac Rehab at [facility].

## 2021-02-19 ENCOUNTER — Other Ambulatory Visit: Payer: Self-pay

## 2021-02-19 ENCOUNTER — Encounter (HOSPITAL_COMMUNITY)
Admission: RE | Admit: 2021-02-19 | Discharge: 2021-02-19 | Disposition: A | Discharge status: Home / Self Care | Payer: Medicare PPO | Source: Ambulatory Visit | Attending: Internal Medicine | Admitting: Internal Medicine

## 2021-02-19 VITALS — Wt 150.6 lb

## 2021-02-19 DIAGNOSIS — R053 Chronic cough: Secondary | ICD-10-CM

## 2021-02-19 DIAGNOSIS — J398 Other specified diseases of upper respiratory tract: Secondary | ICD-10-CM | POA: Diagnosis not present

## 2021-02-19 NOTE — Progress Notes (Signed)
Daily Session Note  Patient Details  Name: Shreeya Recendiz MRN: [id number] Date of Birth: 06-01-1951 Referring Provider:   April Manson Pulmonary Rehab Walk Test from 01/18/2021 in [place]  Referring Provider Dr. Chase Caller      Encounter Date: 02/19/2021  Check In:  Session Check In - 02/19/21 1448      Check-In   Physician(s) Dr. Teryl Lucy    Staff Present Rosebud Poles, RN, Isaac Laud, MS, ACSM-CEP, Exercise Physiologist;Olinty Celesta Aver, MS, ACSM CEP, Exercise Physiologist;Lisa Ysidro Evert, RN    Virtual Visit No    Medication changes reported     No    Fall or balance concerns reported    No    Tobacco Cessation No Change    Warm-up and Cool-down Performed as group-led instruction    Resistance Training Performed Yes    VAD Patient? No    PAD/SET Patient? No      Pain Assessment   Currently in Pain? No/denies    Multiple Pain Sites No           Capillary Blood Glucose: No results found for this or any previous visit (from the past 24 hour(s)).   Exercise Prescription Changes - 02/19/21 1500      Response to Exercise   Blood Pressure (Admit) 126/60    Blood Pressure (Exercise) 132/60    Blood Pressure (Exit) 110/62    Heart Rate (Admit) 70 bpm    Heart Rate (Exercise) 94 bpm    Heart Rate (Exit) 72 bpm    Oxygen Saturation (Admit) 98 %    Oxygen Saturation (Exercise) 96 %    Oxygen Saturation (Exit) 96 %    Rating of Perceived Exertion (Exercise) 10    Perceived Dyspnea (Exercise) 1    Duration Continue with 30 min of aerobic exercise without signs/symptoms of physical distress.    Intensity THRR unchanged      Progression   Progression Continue to progress workloads to maintain intensity without signs/symptoms of physical distress.      Resistance Training   Training Prescription Yes    Weight red bands    Reps 10-15    Time 10 Minutes      Treadmill   MPH 2.5    Grade 1    Minutes 15      Recumbant Bike    Level 2    Minutes 15    METs 2.4           Social History   Tobacco Use  Smoking Status Former Smoker  . Packs/day: 2.00  . Years: 35.00  . Pack years: 70.00  . Types: Cigarettes  . Quit date: 11/19/2013  . Years since quitting: 7.2  Smokeless Tobacco Never Used    Goals Met:  Proper associated with RPD/PD & O2 Sat [place] with exercise equipment Exercise tolerated well Strength training completed today Patient completed pulmonary rehab program today  Goals Unmet:  Not Applicable  Comments: Service time is from 1315 to 1430 Patient completed pulmonary rehab undergrad program and made improvements with 6 minute walk test.     Dr. Fransico Him is Medical Director for Cardiac Rehab at [facility].

## 2021-02-19 NOTE — Progress Notes (Signed)
I have reviewed a Home Exercise Prescription with Kathryn Booker . Kathryn Booker is currently exercising at a local senior center a couple of times a week.  The patient was advised to continue exercise at senior center and also to add a couple of days of walking and resistance training at home 2-3 days a week for 30-45 minutes.  Kathryn Booker and I discussed how to progress their exercise prescription.  The patient stated that their goals were lose weight, gain flexibility, and to stay healthy.  The patient stated that they understand the exercise prescription.  We reviewed exercise guidelines, target heart rate during exercise, RPE Scale, weather conditions, endpoints for exercise, warmup and cool down.  Patient is encouraged to come to me with any questions. I will continue to follow up with the patient to assist them with progression and safety.    Rick Duff MS, ACSM CEP

## 2021-02-21 ENCOUNTER — Encounter (HOSPITAL_COMMUNITY): Payer: Medicare PPO

## 2021-02-26 ENCOUNTER — Ambulatory Visit (INDEPENDENT_AMBULATORY_CARE_PROVIDER_SITE_OTHER): Payer: Medicare PPO

## 2021-02-26 ENCOUNTER — Encounter (HOSPITAL_COMMUNITY): Payer: Medicare PPO

## 2021-02-26 DIAGNOSIS — I480 Paroxysmal atrial fibrillation: Secondary | ICD-10-CM

## 2021-02-28 ENCOUNTER — Encounter (HOSPITAL_COMMUNITY): Payer: Medicare PPO

## 2021-02-28 LAB — CUP PACEART REMOTE DEVICE CHECK
Date Time Interrogation Session: 20220526105648
Implantable Pulse Generator Implant Date: 20210208

## 2021-03-02 ENCOUNTER — Other Ambulatory Visit: Payer: Self-pay | Admitting: Cardiology

## 2021-03-05 ENCOUNTER — Encounter (HOSPITAL_COMMUNITY): Payer: Medicare PPO

## 2021-03-05 DIAGNOSIS — R7303 Prediabetes: Secondary | ICD-10-CM | POA: Diagnosis not present

## 2021-03-05 DIAGNOSIS — M810 Age-related osteoporosis without current pathological fracture: Secondary | ICD-10-CM | POA: Diagnosis not present

## 2021-03-07 ENCOUNTER — Encounter (HOSPITAL_COMMUNITY): Payer: Medicare PPO

## 2021-03-07 DIAGNOSIS — F339 Major depressive disorder, recurrent, unspecified: Secondary | ICD-10-CM | POA: Diagnosis not present

## 2021-03-07 DIAGNOSIS — G4733 Obstructive sleep apnea (adult) (pediatric): Secondary | ICD-10-CM | POA: Diagnosis not present

## 2021-03-07 DIAGNOSIS — K219 Gastro-esophageal reflux disease without esophagitis: Secondary | ICD-10-CM | POA: Diagnosis not present

## 2021-03-07 DIAGNOSIS — J432 Centrilobular emphysema: Secondary | ICD-10-CM | POA: Diagnosis not present

## 2021-03-07 DIAGNOSIS — I2510 Atherosclerotic heart disease of native coronary artery without angina pectoris: Secondary | ICD-10-CM | POA: Diagnosis not present

## 2021-03-07 DIAGNOSIS — Z0000 Encounter for general adult medical examination without abnormal findings: Secondary | ICD-10-CM | POA: Diagnosis not present

## 2021-03-07 DIAGNOSIS — R0989 Other specified symptoms and signs involving the circulatory and respiratory systems: Secondary | ICD-10-CM | POA: Diagnosis not present

## 2021-03-07 DIAGNOSIS — I480 Paroxysmal atrial fibrillation: Secondary | ICD-10-CM | POA: Diagnosis not present

## 2021-03-07 DIAGNOSIS — M810 Age-related osteoporosis without current pathological fracture: Secondary | ICD-10-CM | POA: Diagnosis not present

## 2021-03-12 ENCOUNTER — Encounter (HOSPITAL_COMMUNITY): Payer: Medicare PPO

## 2021-03-14 ENCOUNTER — Encounter (HOSPITAL_COMMUNITY): Payer: Medicare PPO

## 2021-03-19 ENCOUNTER — Encounter (HOSPITAL_COMMUNITY): Payer: Medicare PPO

## 2021-03-20 NOTE — Progress Notes (Signed)
Carelink Summary Report / Loop Recorder 

## 2021-03-21 ENCOUNTER — Encounter (HOSPITAL_COMMUNITY): Payer: Medicare PPO

## 2021-03-28 DIAGNOSIS — E118 Type 2 diabetes mellitus with unspecified complications: Secondary | ICD-10-CM | POA: Diagnosis not present

## 2021-03-28 DIAGNOSIS — I2510 Atherosclerotic heart disease of native coronary artery without angina pectoris: Secondary | ICD-10-CM | POA: Diagnosis not present

## 2021-03-28 LAB — CUP PACEART REMOTE DEVICE CHECK
Date Time Interrogation Session: 20220623085842
Implantable Pulse Generator Implant Date: 20210208

## 2021-03-28 NOTE — Progress Notes (Signed)
Discharge Progress Report  Patient Details  Name: Kathryn Booker MRN: [id number] Date of Birth: 23-Apr-1951 Referring Provider:   [facility] Walk Test from 01/18/2021 in [place]  Referring Provider Dr. Chase Caller        Number of Visits: 8  Reason for Discharge:  Patient reached a stable level of exercise. Patient independent in their exercise. Patient has met program and personal goals.She wanted to resume her exercise at a local gym which was cheaper than her copay for pulmonary rehab.  Smoking History:  Social History   Tobacco Use  Smoking Status Former   Packs/day: 2.00   Years: 35.00   Pack years: 70.00   Types: Cigarettes   Quit date: 11/19/2013   Years since quitting: 7.3  Smokeless Tobacco Never    Diagnosis:  Chronic cough  Other specified diseases of upper respiratory tract  ADL UCSD:  Pulmonary Assessment Scores     Row Name 01/18/21 1038 02/19/21 1531 02/26/21 1548     ADL UCSD   ADL Phase Entry Exit Exit   SOB Score total 12 -- 16     CAT Score   CAT Score 13 -- 11     mMRC Score   mMRC Score 0 0 --            Initial Exercise Prescription:  Initial Exercise Prescription - 01/18/21 1000       Date of Initial Exercise RX and Referring Provider   Date 01/18/21    Referring Provider Dr. Chase Caller    Expected Discharge Date 03/21/21      Treadmill   MPH 2    Grade 0    Minutes 15      Recumbant Bike   Level 1.5    RPM 60    Minutes 15      Prescription Details   Frequency (times per week) 2    Duration Progress to 30 minutes of continuous aerobic without signs/symptoms of physical distress      Intensity   THRR 40-80% of Max Heartrate 61-122    Ratings of Perceived Exertion 11-13    Perceived Dyspnea 0-4      Progression   Progression Continue to progress workloads to maintain intensity without signs/symptoms of physical distress.      Resistance Training    Training Prescription Yes    Weight Red bands    Reps 10-15             Discharge Exercise Prescription (Final Exercise Prescription Changes):  Exercise Prescription Changes - 02/19/21 1500       Response to Exercise   Blood Pressure (Admit) 126/60    Blood Pressure (Exercise) 132/60    Blood Pressure (Exit) 110/62    Heart Rate (Admit) 70 bpm    Heart Rate (Exercise) 94 bpm    Heart Rate (Exit) 72 bpm    Oxygen Saturation (Admit) 98 %    Oxygen Saturation (Exercise) 96 %    Oxygen Saturation (Exit) 96 %    Rating of Perceived Exertion (Exercise) 10    Perceived Dyspnea (Exercise) 1    Duration Continue with 30 min of aerobic exercise without signs/symptoms of physical distress.    Intensity THRR unchanged      Progression   Progression Continue to progress workloads to maintain intensity without signs/symptoms of physical distress.      Resistance Training   Training Prescription Yes    Weight red bands  Reps 10-15    Time 10 Minutes      Treadmill   MPH 2.5    Grade 1    Minutes 15      Recumbant Bike   Level 2    Minutes 15    METs 2.4      Home Exercise Plan   Plans to continue exercise at [employer] (comment)   [place] 2 additional days to program exercise sessions.    Initial Home Exercises Provided 02/19/21             Functional Capacity:  6 Minute Walk     Row Name 01/18/21 1049 02/19/21 1528       6 Minute Walk   Phase Initial Discharge    Distance 1222 feet 1600 feet    Distance % Change -- 30.93 %    Distance Feet Change -- 378 ft    Walk Time 6 minutes 6 minutes    # of Rest Breaks 0 0    MPH 2.31 3.03    METS 2.48 3.25    RPE 9 8    Perceived Dyspnea  1.5 1    VO2 Peak 8.69 11.39    Symptoms No No    Resting HR 69 bpm 73 bpm    Resting BP 116/60 126/60    Resting Oxygen Saturation  96 % 98 %    Exercise Oxygen Saturation  during 6 min walk 95 % 96 %    Max Ex. HR 76 bpm 81 bpm    Max Ex.  BP 122/66 132/60    2 Minute Post BP 120/60 110/60         Interval HR      1 Minute HR 72 81    2 Minute HR 72 81    3 Minute HR 72 81    4 Minute HR 76 81    5 Minute HR 76 81    6 Minute HR 76 81    2 Minute Post HR 57 77    Interval Heart Rate? Yes Yes         Interval Oxygen      Interval Oxygen? Yes Yes    Baseline Oxygen Saturation % 96 % 98 %    1 Minute Oxygen Saturation % 95 % 96 %    1 Minute Liters of Oxygen 0 L 0 L    2 Minute Oxygen Saturation % 95 % 96 %    2 Minute Liters of Oxygen 0 L 0 L    3 Minute Oxygen Saturation % 95 % 96 %    3 Minute Liters of Oxygen 0 L 0 L    4 Minute Oxygen Saturation % 95 % 97 %    4 Minute Liters of Oxygen 0 L 0 L    5 Minute Oxygen Saturation % 96 % 96 %    5 Minute Liters of Oxygen 0 L 0 L    6 Minute Oxygen Saturation % 96 % 96 %    6 Minute Liters of Oxygen 0 L 0 L    2 Minute Post Oxygen Saturation % 98 % 98 %    2 Minute Post Liters of Oxygen 0 L 0 L            Psychological, QOL, Others - Outcomes: PHQ 2/9: Depression screen [facility] 2/9 02/19/2021 01/18/2021 01/18/2021 02/02/2019 08/07/2016  Decreased Interest 0 - 0 0 0  Down, Depressed,  Hopeless 0 0 0 0 0  PHQ - 2 Score 0 0 0 0 0  Altered sleeping 0 0 - - -  Tired, decreased energy 0 1 - - -  Change in appetite 0 0 - - -  Feeling bad or failure about yourself  0 0 - - -  Trouble concentrating 0 0 - - -  Moving slowly or fidgety/restless 0 0 - - -  Suicidal thoughts 0 0 - - -  PHQ-9 Score 0 1 - - -  Difficult doing work/chores Not difficult at all Not difficult at all - - -  Some recent data might be hidden    Quality of Life:   Personal Goals: Goals established at orientation with interventions provided to work toward goal.  Personal Goals and Risk Factors at Admission - 01/18/21 1045       Core Components/Risk Factors/Patient Goals on Admission   Intervention Obesity: Provide education and appropriate resources to help participant work on and attain  dietary goals.;Weight Management/Obesity: Establish reasonable short term and long term weight goals.;Weight Management: Develop a combined nutrition and exercise program designed to reach desired caloric intake, while maintaining appropriate intake of nutrient and fiber, sodium and fats, and appropriate energy expenditure required for the weight goal.;Weight Management: Provide education and appropriate resources to help participant work on and attain dietary goals.    Expected Outcomes Short Term: Continue to assess and modify interventions until short term weight is achieved;Long Term: Adherence to nutrition and physical activity/exercise program aimed toward attainment of established weight goal    Improve shortness of breath with ADL's Yes    Intervention Provide education, individualized exercise plan and daily activity instruction to help decrease symptoms of SOB with activities of daily living.    Expected Outcomes Short Term: Improve cardiorespiratory fitness to achieve a reduction of symptoms when performing ADLs;Long Term: Be able to perform more ADLs without symptoms or delay the onset of symptoms    Lipids Yes    Intervention Provide education and support for participant on nutrition & aerobic/resistive exercise along with prescribed medications to achieve LDL <39m, HDL >430m    Expected Outcomes Short Term: Participant states understanding of desired cholesterol values and is compliant with medications prescribed. Participant is following exercise prescription and nutrition guidelines.;Long Term: Cholesterol controlled with medications as prescribed, with individualized exercise RX and with personalized nutrition plan. Value goals: LDL < 7093mHDL > 40 mg.              Personal Goals Discharge:  Goals and Risk Factor Review     Row Name 01/24/21 0817 02/18/21 1344           Core Components/Risk Factors/Patient Goals Review   Personal Goals Review Develop more efficient  breathing techniques such as purse lipped breathing and diaphragmatic breathing and practicing self-pacing with activity.;Increase knowledge of respiratory medications and ability to use respiratory devices properly.;Improve shortness of breath with ADL's Develop more efficient breathing techniques such as purse lipped breathing and diaphragmatic breathing and practicing self-pacing with activity.;Increase knowledge of respiratory medications and ability to use respiratory devices properly.;Improve shortness of breath with ADL's      Review Just started pulmonary rehab, has attended 1 exercise session, too early to see progression toward program goals. [place] has been in the program 4 weeks, she has atrial fibrillation and one day her heart rate was 114-122 at rest.  She went home and took her prescribed Diltiazem and her heart rate camce down below  100.  She works hard and feels her strength and stamina have improved.      Expected Outcomes See admission goals See admission goals               Exercise Goals and Review:  Exercise Goals     Row Name 01/18/21 1036             Exercise Goals   Increase Physical Activity Yes       Intervention Provide advice, education, support and counseling about physical activity/exercise needs.;Develop an individualized exercise prescription for aerobic and resistive training based on initial evaluation findings, risk stratification, comorbidities and participant's personal goals.       Expected Outcomes Short Term: Attend rehab on a regular basis to increase amount of physical activity.;Long Term: Add in home exercise to make exercise part of routine and to increase amount of physical activity.;Long Term: Exercising regularly at least 3-5 days a week.       Increase Strength and Stamina Yes       Intervention Provide advice, education, support and counseling about physical activity/exercise needs.;Develop an individualized exercise prescription for aerobic  and resistive training based on initial evaluation findings, risk stratification, comorbidities and participant's personal goals.       Expected Outcomes Short Term: Increase workloads from initial exercise prescription for resistance, speed, and METs.;Short Term: Perform resistance training exercises routinely during rehab and add in resistance training at home;Long Term: Improve cardiorespiratory fitness, muscular endurance and strength as measured by increased METs and functional capacity (6MWT)       Able to understand and use rate of perceived exertion (RPE) scale Yes       Intervention Provide education and explanation on how to use RPE scale       Expected Outcomes Short Term: Able to use RPE daily in rehab to express subjective intensity level;Long Term:  Able to use RPE to guide intensity level when exercising independently       Able to understand and use Dyspnea scale Yes       Intervention Provide education and explanation on how to use Dyspnea scale       Expected Outcomes Short Term: Able to use Dyspnea scale daily in rehab to express subjective sense of shortness of breath during exertion;Long Term: Able to use Dyspnea scale to guide intensity level when exercising independently       Knowledge and understanding of Target Heart Rate Range (THRR) Yes       Intervention Provide education and explanation of THRR including how the numbers were predicted and where they are located for reference       Expected Outcomes Short Term: Able to state/look up THRR;Short Term: Able to use daily as guideline for intensity in rehab;Long Term: Able to use THRR to govern intensity when exercising independently       Understanding of Exercise Prescription Yes       Intervention Provide education, explanation, and written materials on patient's individual exercise prescription       Expected Outcomes Short Term: Able to explain program exercise prescription;Long Term: Able to explain home exercise  prescription to exercise independently                Exercise Goals Re-Evaluation:  Exercise Goals Re-Evaluation     [place] Name 01/28/21 1541             Exercise Goal Re-Evaluation   Exercise Goals Review Increase Physical Activity;Increase Strength and Stamina;Able to understand  and use rate of perceived exertion (RPE) scale;Able to understand and use Dyspnea scale;Knowledge and understanding of Target Heart Rate Range (THRR);Understanding of Exercise Prescription       Comments The pt has completed 2 exercise sessions and has tolerated well so far with no complaints or concerns. It is too early to note any progressions, will continue to monitor and progress as she is able. She is exercising at 2.2 METS on the bike and 2.5 METS on the treadmill.       Expected Outcomes Through exercise at rehab and home, the patient will decrease shortness of breath with daily activities and feel confident in carrying out an exercise regimn at home.                Nutrition & Weight - Outcomes:  Pre Biometrics - 01/18/21 1037       Pre Biometrics   Grip Strength 19 kg              Nutrition:  Nutrition Therapy & Goals - 02/05/21 1400       Nutrition Therapy   Diet TLC      Personal Nutrition Goals   Nutrition Goal Pt to continue building a healthy plate including vegetables, fruits, whole grains, and low-fat dairy products in a heart healthy meal plan.      Intervention Plan   Intervention Prescribe, educate and counsel regarding individualized specific dietary modifications aiming towards targeted core components such as weight, hypertension, lipid management, diabetes, heart failure and other comorbidities.;Nutrition handout(s) given to patient.    Expected Outcomes Short Term Goal: Understand basic principles of dietary content, such as calories, fat, sodium, cholesterol and nutrients.             Nutrition Discharge:   Education Questionnaire Score:  Knowledge  Questionnaire Score - 02/26/21 1547       Knowledge Questionnaire Score   Post Score 17/18             Goals reviewed with patient; copy given to patient.

## 2021-03-28 NOTE — Addendum Note (Signed)
Encounter addended by: Lance Morin, RN on: 03/28/2021 4:35 PM  Actions taken: Clinical Note Signed, Episode resolved

## 2021-03-28 NOTE — Addendum Note (Signed)
Encounter addended by: George Ina, RD on: 03/28/2021 6:54 AM  Actions taken: Flowsheet data copied forward, Flowsheet accepted

## 2021-04-01 ENCOUNTER — Ambulatory Visit (INDEPENDENT_AMBULATORY_CARE_PROVIDER_SITE_OTHER): Payer: Medicare PPO

## 2021-04-01 DIAGNOSIS — I480 Paroxysmal atrial fibrillation: Secondary | ICD-10-CM | POA: Diagnosis not present

## 2021-04-02 DIAGNOSIS — E1159 Type 2 diabetes mellitus with other circulatory complications: Secondary | ICD-10-CM | POA: Diagnosis not present

## 2021-04-02 DIAGNOSIS — Z794 Long term (current) use of insulin: Secondary | ICD-10-CM | POA: Diagnosis not present

## 2021-04-02 DIAGNOSIS — I2510 Atherosclerotic heart disease of native coronary artery without angina pectoris: Secondary | ICD-10-CM | POA: Diagnosis not present

## 2021-04-02 DIAGNOSIS — I700 Atherosclerosis of aorta: Secondary | ICD-10-CM | POA: Diagnosis not present

## 2021-04-02 DIAGNOSIS — E7800 Pure hypercholesterolemia, unspecified: Secondary | ICD-10-CM | POA: Diagnosis not present

## 2021-04-18 NOTE — Progress Notes (Signed)
Carelink Summary Report / Loop Recorder 

## 2021-04-19 DIAGNOSIS — M7542 Impingement syndrome of left shoulder: Secondary | ICD-10-CM | POA: Diagnosis not present

## 2021-04-22 ENCOUNTER — Other Ambulatory Visit: Payer: Self-pay | Admitting: Internal Medicine

## 2021-04-22 DIAGNOSIS — Z1231 Encounter for screening mammogram for malignant neoplasm of breast: Secondary | ICD-10-CM

## 2021-04-26 ENCOUNTER — Other Ambulatory Visit: Payer: Self-pay | Admitting: Internal Medicine

## 2021-05-02 ENCOUNTER — Ambulatory Visit (INDEPENDENT_AMBULATORY_CARE_PROVIDER_SITE_OTHER): Payer: Medicare PPO

## 2021-05-02 DIAGNOSIS — I480 Paroxysmal atrial fibrillation: Secondary | ICD-10-CM | POA: Diagnosis not present

## 2021-05-04 LAB — CUP PACEART REMOTE DEVICE CHECK
Date Time Interrogation Session: 20220729200547
Implantable Pulse Generator Implant Date: 20210208

## 2021-05-16 DIAGNOSIS — M75102 Unspecified rotator cuff tear or rupture of left shoulder, not specified as traumatic: Secondary | ICD-10-CM | POA: Diagnosis not present

## 2021-05-28 DIAGNOSIS — N950 Postmenopausal bleeding: Secondary | ICD-10-CM | POA: Diagnosis not present

## 2021-05-28 DIAGNOSIS — N390 Urinary tract infection, site not specified: Secondary | ICD-10-CM | POA: Diagnosis not present

## 2021-05-28 DIAGNOSIS — R319 Hematuria, unspecified: Secondary | ICD-10-CM | POA: Diagnosis not present

## 2021-05-28 DIAGNOSIS — N8182 Incompetence or weakening of pubocervical tissue: Secondary | ICD-10-CM | POA: Diagnosis not present

## 2021-05-28 DIAGNOSIS — R102 Pelvic and perineal pain: Secondary | ICD-10-CM | POA: Diagnosis not present

## 2021-05-29 NOTE — Progress Notes (Signed)
Carelink Summary Report / Loop Recorder 

## 2021-05-30 ENCOUNTER — Other Ambulatory Visit: Payer: Self-pay

## 2021-05-30 ENCOUNTER — Ambulatory Visit
Admission: RE | Admit: 2021-05-30 | Discharge: 2021-05-30 | Disposition: A | Discharge status: Home / Self Care | Payer: Medicare PPO | Source: Ambulatory Visit | Attending: Internal Medicine | Admitting: Internal Medicine

## 2021-05-30 DIAGNOSIS — Z1231 Encounter for screening mammogram for malignant neoplasm of breast: Secondary | ICD-10-CM | POA: Diagnosis not present

## 2021-05-31 DIAGNOSIS — H524 Presbyopia: Secondary | ICD-10-CM | POA: Diagnosis not present

## 2021-05-31 DIAGNOSIS — H25013 Cortical age-related cataract, bilateral: Secondary | ICD-10-CM | POA: Diagnosis not present

## 2021-05-31 DIAGNOSIS — E119 Type 2 diabetes mellitus without complications: Secondary | ICD-10-CM | POA: Diagnosis not present

## 2021-05-31 DIAGNOSIS — H2513 Age-related nuclear cataract, bilateral: Secondary | ICD-10-CM | POA: Diagnosis not present

## 2021-06-04 ENCOUNTER — Other Ambulatory Visit: Payer: Self-pay | Admitting: Internal Medicine

## 2021-06-04 ENCOUNTER — Ambulatory Visit (INDEPENDENT_AMBULATORY_CARE_PROVIDER_SITE_OTHER): Payer: Medicare PPO

## 2021-06-04 DIAGNOSIS — R928 Other abnormal and inconclusive findings on diagnostic imaging of breast: Secondary | ICD-10-CM

## 2021-06-04 DIAGNOSIS — I480 Paroxysmal atrial fibrillation: Secondary | ICD-10-CM | POA: Diagnosis not present

## 2021-06-05 LAB — CUP PACEART REMOTE DEVICE CHECK
Date Time Interrogation Session: 20220831100531
Implantable Pulse Generator Implant Date: 20210208

## 2021-06-07 DIAGNOSIS — M25512 Pain in left shoulder: Secondary | ICD-10-CM | POA: Diagnosis not present

## 2021-06-17 NOTE — Progress Notes (Signed)
Carelink Summary Report / Loop Recorder 

## 2021-06-20 ENCOUNTER — Other Ambulatory Visit: Payer: Medicare PPO

## 2021-06-20 DIAGNOSIS — H25012 Cortical age-related cataract, left eye: Secondary | ICD-10-CM | POA: Diagnosis not present

## 2021-06-20 DIAGNOSIS — H25812 Combined forms of age-related cataract, left eye: Secondary | ICD-10-CM | POA: Diagnosis not present

## 2021-06-20 DIAGNOSIS — H25042 Posterior subcapsular polar age-related cataract, left eye: Secondary | ICD-10-CM | POA: Diagnosis not present

## 2021-06-20 DIAGNOSIS — H2512 Age-related nuclear cataract, left eye: Secondary | ICD-10-CM | POA: Diagnosis not present

## 2021-06-27 ENCOUNTER — Ambulatory Visit
Admission: RE | Admit: 2021-06-27 | Discharge: 2021-06-27 | Disposition: A | Discharge status: Home / Self Care | Payer: Medicare PPO | Source: Ambulatory Visit | Attending: Internal Medicine | Admitting: Internal Medicine

## 2021-06-27 ENCOUNTER — Other Ambulatory Visit: Payer: Self-pay

## 2021-06-27 DIAGNOSIS — R928 Other abnormal and inconclusive findings on diagnostic imaging of breast: Secondary | ICD-10-CM

## 2021-06-27 DIAGNOSIS — I700 Atherosclerosis of aorta: Secondary | ICD-10-CM | POA: Diagnosis not present

## 2021-06-27 DIAGNOSIS — E7800 Pure hypercholesterolemia, unspecified: Secondary | ICD-10-CM | POA: Diagnosis not present

## 2021-06-27 DIAGNOSIS — I2510 Atherosclerotic heart disease of native coronary artery without angina pectoris: Secondary | ICD-10-CM | POA: Diagnosis not present

## 2021-06-27 DIAGNOSIS — E1159 Type 2 diabetes mellitus with other circulatory complications: Secondary | ICD-10-CM | POA: Diagnosis not present

## 2021-06-27 DIAGNOSIS — R922 Inconclusive mammogram: Secondary | ICD-10-CM | POA: Diagnosis not present

## 2021-07-04 DIAGNOSIS — E1159 Type 2 diabetes mellitus with other circulatory complications: Secondary | ICD-10-CM | POA: Diagnosis not present

## 2021-07-04 DIAGNOSIS — I700 Atherosclerosis of aorta: Secondary | ICD-10-CM | POA: Diagnosis not present

## 2021-07-04 DIAGNOSIS — Z79899 Other long term (current) drug therapy: Secondary | ICD-10-CM | POA: Diagnosis not present

## 2021-07-04 DIAGNOSIS — Z23 Encounter for immunization: Secondary | ICD-10-CM | POA: Diagnosis not present

## 2021-07-04 DIAGNOSIS — I2510 Atherosclerotic heart disease of native coronary artery without angina pectoris: Secondary | ICD-10-CM | POA: Diagnosis not present

## 2021-07-04 DIAGNOSIS — I480 Paroxysmal atrial fibrillation: Secondary | ICD-10-CM | POA: Diagnosis not present

## 2021-07-04 DIAGNOSIS — E7800 Pure hypercholesterolemia, unspecified: Secondary | ICD-10-CM | POA: Diagnosis not present

## 2021-07-04 DIAGNOSIS — Z794 Long term (current) use of insulin: Secondary | ICD-10-CM | POA: Diagnosis not present

## 2021-07-17 ENCOUNTER — Ambulatory Visit: Payer: Medicare PPO | Admitting: Internal Medicine

## 2021-07-17 ENCOUNTER — Other Ambulatory Visit: Payer: Self-pay

## 2021-07-17 VITALS — BP 122/66 | HR 55 | Ht 63.0 in | Wt 137.4 lb

## 2021-07-17 DIAGNOSIS — G4733 Obstructive sleep apnea (adult) (pediatric): Secondary | ICD-10-CM | POA: Diagnosis not present

## 2021-07-17 DIAGNOSIS — I10 Essential (primary) hypertension: Secondary | ICD-10-CM

## 2021-07-17 DIAGNOSIS — I480 Paroxysmal atrial fibrillation: Secondary | ICD-10-CM | POA: Diagnosis not present

## 2021-07-17 NOTE — Patient Instructions (Addendum)
Medication Instructions:  Your physician recommends that you continue on your current medications as directed. Please refer to the Current Medication list given to you today. *If you need a refill on your cardiac medications before your next appointment, please call your pharmacy*  Lab Work: None. If you have labs (blood work) drawn today and your tests are completely normal, you will receive your results only by: [place] (if you have MyChart) OR A paper copy in the mail If you have any lab test that is abnormal or we need to change your treatment, we will call you to review the results.  Testing/Procedures: None.  Follow-Up: At [facility], you and your health needs are our priority.  As part of our continuing mission to provide you with exceptional heart care, we have created designated Provider Care Teams.  These Care Teams include your primary Cardiologist (physician) and Advanced Practice Providers (APPs -  Physician Assistants and Nurse Practitioners) who all work together to provide you with the care you need, when you need it.  Your physician wants you to follow-up in: 12 months one of the following Advanced Practice Providers on your designated Care Team:    Tommye Standard, PA-C Legrand Como "Jonni Sanger" [place], [place]   You will receive a reminder letter in the mail two months in advance. If you don't receive a letter, please call our office to schedule the follow-up appointment.  We recommend signing up for the patient portal called "MyChart".  Sign up information is provided on this After Visit Summary.  MyChart is used to connect with patients for Virtual Visits (Telemedicine).  Patients are able to view lab/test results, encounter notes, upcoming appointments, etc.  Non-urgent messages can be sent to your provider as well.   To learn more about what you can do with MyChart, go to [email or web address].    Any Other Special Instructions Will Be Listed Below (If  Applicable).

## 2021-07-17 NOTE — Progress Notes (Signed)
PCP: Deland Pretty, MD   Primary EP: Dr Malva Cogan Kathryn Booker is a 70 y.o. female who presents today for routine electrophysiology followup.  Since last being seen in our clinic, the patient reports doing very well.  Today, she denies symptoms of palpitations, chest pain, shortness of breath,  lower extremity edema, dizziness, presyncope, or syncope.  The patient is otherwise without complaint today.   Past Medical History:  Diagnosis Date   Allergies    Anxiety    Asthma    COPD (chronic obstructive pulmonary disease) (HCC)    Coronary artery calcification seen on CT scan    Depression    Fatigue    Former tobacco use    Hyperlipidemia    Paroxysmal atrial fibrillation (HCC)    Pre-diabetes    RBBB (right bundle branch block with left anterior fascicular block)    Scoliosis    Serum calcium elevated    Thyroid disease    "questionable" per patient   Past Surgical History:  Procedure Laterality Date   APPENDECTOMY     BREAST EXCISIONAL BIOPSY Right 2017   benign   implantable loop recorder placement  11/14/2019   Medtronic Reveal Linq model LNQ 2 (SN [id number]) implantable loop recorder    LAPROSCOPIC     X 2 IN EARLY 30-40'S   OVARIAN CYST REMOVAL     RADIOACTIVE SEED GUIDED EXCISIONAL BREAST BIOPSY Right 05/15/2016   Procedure: RIGHT RADIOACTIVE SEED GUIDED EXCISIONAL BREAST BIOPSY;  Surgeon: Rolm Bookbinder, MD;  Location: [place];  Service: General;  Laterality: Right;  RIGHT RADIOACTIVE SEED GUIDED EXCISIONAL BREAST BIOPSY   TONSILECTOMY, ADENOIDECTOMY, BILATERAL MYRINGOTOMY AND TUBES      ROS- all systems are reviewed and negatives except as per HPI above  Current Outpatient Medications  Medication Sig Dispense Refill   albuterol (PROVENTIL HFA;VENTOLIN HFA) 108 (90 Base) MCG/ACT inhaler Use 2 inhalations 15 minutes apart every 4 hours to rescue Asthma 3 Inhaler 3   buPROPion (WELLBUTRIN XL) 300 MG 24 hr tablet Take 1 tablet every  Morning or Mood, Focus & Concentration 90 tablet 1   cholecalciferol (VITAMIN D3) 25 MCG (1000 UNIT) tablet Take 1,000 Units by mouth daily.      citalopram (CELEXA) 20 MG tablet Take 20 mg by mouth daily.     COVID-19 mRNA vaccine, Pfizer, 30 MCG/0.3ML injection USE AS DIRECTED .3 mL 0   cyclobenzaprine (FLEXERIL) 10 MG tablet Take 10 mg by mouth at bedtime as needed.      diltiazem (CARDIZEM CD) 240 MG 24 hr capsule TAKE 1 CAPSULE BY MOUTH EVERY DAY 90 capsule 3   diltiazem (CARDIZEM) 30 MG tablet Take 1 tablet Daily for  rapid Heart Beat (Patient taking differently: Take 1 tablet Daily for  rapid Heart Beat) 90 tablet 1   ezetimibe (ZETIA) 10 MG tablet TAKE 1 TABLET BY MOUTH EVERY DAY 90 tablet 1   magnesium oxide (MAG-OX) 400 MG tablet Take 400 mg by mouth daily.     Omega-3 Fatty Acids (FISH OIL) 1000 MG CAPS Take 1 capsule by mouth.     OVER THE COUNTER MEDICATION Eye drop- 1 drop each eye as needed. Equate dry eye     rosuvastatin (CRESTOR) 5 MG tablet Take 5 mg by mouth daily.     valACYclovir (VALTREX) 500 MG tablet Take 1 tablet Daily for Prevention Fever Blisters 90 tablet 3   XARELTO 20 MG TABS tablet TAKE 1 TABLET DAILY TO PREVENT BLOOD CLOTS  90 tablet 0   No current facility-administered medications for this visit.    Physical Exam: Vitals:   07/17/21 1459  BP: 122/66  Pulse: (!) 55  SpO2: 97%  Weight: 137 lb 6.4 oz (62.3 kg)  Height: 5\' 3"  (1.6 m)    GEN- The patient is well appearing, alert and oriented x 3 today.   Head- normocephalic, atraumatic Eyes-  Sclera clear, conjunctiva pink Ears- hearing intact Oropharynx- clear Lungs- Clear to ausculation bilaterally, normal work of breathing Heart- Regular rate and rhythm, no murmurs, rubs or gallops, PMI not laterally displaced GI- soft, NT, ND, + BS Extremities- no clubbing, cyanosis, or edema  Wt Readings from Last 3 Encounters:  07/17/21 137 lb 6.4 oz (62.3 kg)  02/19/21 150 lb 9.2 oz (68.3 kg)  02/05/21 141  lb 1.5 oz (64 kg)    EKG tracing ordered today is personally reviewed and shows sinus bradycardia 55 bpm, PR 126 msec, RBBB, LAD  Assessment and Plan:  Paroxysmal atrial fibrillation Burden by ILR is 0% (previously 2.6%) She has PACs/ PVCs  2. HTN Stable No change required today  3. OSA She is compliant with CPAP  4. Overweight She has lost over 13 lbs!  Doing well with lifestyle change.  Risks, benefits and potential toxicities for medications prescribed and/or refilled reviewed with patient today.   Return in a year  Thompson Grayer MD, [facility] 07/17/2021 3:09 PM

## 2021-07-22 ENCOUNTER — Encounter: Payer: Medicare PPO | Admitting: Physician Assistant

## 2021-08-06 DIAGNOSIS — Z961 Presence of intraocular lens: Secondary | ICD-10-CM | POA: Diagnosis not present

## 2021-08-07 LAB — CUP PACEART REMOTE DEVICE CHECK
Date Time Interrogation Session: 20221102160819
Implantable Pulse Generator Implant Date: 20210208

## 2021-08-08 DIAGNOSIS — R3915 Urgency of urination: Secondary | ICD-10-CM | POA: Diagnosis not present

## 2021-08-08 DIAGNOSIS — N390 Urinary tract infection, site not specified: Secondary | ICD-10-CM | POA: Diagnosis not present

## 2021-08-12 ENCOUNTER — Ambulatory Visit (INDEPENDENT_AMBULATORY_CARE_PROVIDER_SITE_OTHER): Payer: Medicare PPO

## 2021-08-12 DIAGNOSIS — I480 Paroxysmal atrial fibrillation: Secondary | ICD-10-CM

## 2021-08-13 ENCOUNTER — Other Ambulatory Visit: Payer: Self-pay | Admitting: *Deleted

## 2021-08-13 DIAGNOSIS — Z87891 Personal history of nicotine dependence: Secondary | ICD-10-CM

## 2021-08-19 NOTE — Progress Notes (Signed)
Carelink Summary Report / Loop Recorder 

## 2021-08-27 DIAGNOSIS — H5319 Other subjective visual disturbances: Secondary | ICD-10-CM | POA: Diagnosis not present

## 2021-09-12 LAB — CUP PACEART REMOTE DEVICE CHECK
Date Time Interrogation Session: 20221208085634
Implantable Pulse Generator Implant Date: 20210208

## 2021-09-16 ENCOUNTER — Ambulatory Visit (INDEPENDENT_AMBULATORY_CARE_PROVIDER_SITE_OTHER): Payer: Medicare PPO

## 2021-09-16 DIAGNOSIS — I480 Paroxysmal atrial fibrillation: Secondary | ICD-10-CM | POA: Diagnosis not present

## 2021-09-25 NOTE — Progress Notes (Signed)
Carelink Summary Report / Loop Recorder 

## 2021-09-27 ENCOUNTER — Ambulatory Visit: Payer: Medicare PPO

## 2021-10-15 DIAGNOSIS — H04123 Dry eye syndrome of bilateral lacrimal glands: Secondary | ICD-10-CM | POA: Diagnosis not present

## 2021-10-15 DIAGNOSIS — H01005 Unspecified blepharitis left lower eyelid: Secondary | ICD-10-CM | POA: Diagnosis not present

## 2021-10-17 ENCOUNTER — Other Ambulatory Visit: Payer: Self-pay

## 2021-10-17 ENCOUNTER — Ambulatory Visit (INDEPENDENT_AMBULATORY_CARE_PROVIDER_SITE_OTHER)
Admission: RE | Admit: 2021-10-17 | Discharge: 2021-10-17 | Disposition: A | Discharge status: Home / Self Care | Payer: Medicare PPO | Source: Ambulatory Visit | Attending: Acute Care | Admitting: Acute Care

## 2021-10-17 DIAGNOSIS — Z87891 Personal history of nicotine dependence: Secondary | ICD-10-CM

## 2021-10-18 ENCOUNTER — Other Ambulatory Visit: Payer: Self-pay | Admitting: Acute Care

## 2021-10-18 DIAGNOSIS — Z87891 Personal history of nicotine dependence: Secondary | ICD-10-CM

## 2021-10-21 ENCOUNTER — Ambulatory Visit (INDEPENDENT_AMBULATORY_CARE_PROVIDER_SITE_OTHER): Payer: Medicare PPO

## 2021-10-21 DIAGNOSIS — I480 Paroxysmal atrial fibrillation: Secondary | ICD-10-CM | POA: Diagnosis not present

## 2021-10-22 LAB — CUP PACEART REMOTE DEVICE CHECK
Date Time Interrogation Session: 20230117094009
Implantable Pulse Generator Implant Date: 20210208

## 2021-10-31 NOTE — Progress Notes (Signed)
Carelink Summary Report / Loop Recorder 

## 2021-11-18 DIAGNOSIS — Z6824 Body mass index (BMI) 24.0-24.9, adult: Secondary | ICD-10-CM | POA: Diagnosis not present

## 2021-11-18 DIAGNOSIS — Z1151 Encounter for screening for human papillomavirus (HPV): Secondary | ICD-10-CM | POA: Diagnosis not present

## 2021-11-18 DIAGNOSIS — Z124 Encounter for screening for malignant neoplasm of cervix: Secondary | ICD-10-CM | POA: Diagnosis not present

## 2021-11-19 ENCOUNTER — Other Ambulatory Visit (HOSPITAL_COMMUNITY): Payer: Self-pay | Admitting: Specialist

## 2021-11-19 DIAGNOSIS — M25512 Pain in left shoulder: Secondary | ICD-10-CM

## 2021-11-25 ENCOUNTER — Ambulatory Visit (INDEPENDENT_AMBULATORY_CARE_PROVIDER_SITE_OTHER): Payer: Medicare PPO

## 2021-11-25 DIAGNOSIS — I480 Paroxysmal atrial fibrillation: Secondary | ICD-10-CM

## 2021-11-26 ENCOUNTER — Ambulatory Visit (HOSPITAL_COMMUNITY)
Admission: RE | Admit: 2021-11-26 | Discharge: 2021-11-26 | Disposition: A | Discharge status: Home / Self Care | Payer: Medicare PPO | Source: Ambulatory Visit | Attending: Specialist | Admitting: Specialist

## 2021-11-26 ENCOUNTER — Other Ambulatory Visit: Payer: Self-pay

## 2021-11-26 DIAGNOSIS — M25512 Pain in left shoulder: Secondary | ICD-10-CM | POA: Insufficient documentation

## 2021-11-26 DIAGNOSIS — M75102 Unspecified rotator cuff tear or rupture of left shoulder, not specified as traumatic: Secondary | ICD-10-CM | POA: Diagnosis not present

## 2021-11-26 DIAGNOSIS — M19012 Primary osteoarthritis, left shoulder: Secondary | ICD-10-CM | POA: Diagnosis not present

## 2021-11-26 DIAGNOSIS — M7552 Bursitis of left shoulder: Secondary | ICD-10-CM | POA: Diagnosis not present

## 2021-11-26 LAB — CUP PACEART REMOTE DEVICE CHECK
Date Time Interrogation Session: 20230220115539
Implantable Pulse Generator Implant Date: 20210208

## 2021-11-29 NOTE — Progress Notes (Signed)
Carelink Summary Report / Loop Recorder 

## 2021-12-12 DIAGNOSIS — M25512 Pain in left shoulder: Secondary | ICD-10-CM | POA: Diagnosis not present

## 2021-12-13 DIAGNOSIS — H04122 Dry eye syndrome of left lacrimal gland: Secondary | ICD-10-CM | POA: Diagnosis not present

## 2021-12-24 DIAGNOSIS — E1159 Type 2 diabetes mellitus with other circulatory complications: Secondary | ICD-10-CM | POA: Diagnosis not present

## 2021-12-24 DIAGNOSIS — I700 Atherosclerosis of aorta: Secondary | ICD-10-CM | POA: Diagnosis not present

## 2021-12-24 DIAGNOSIS — Z794 Long term (current) use of insulin: Secondary | ICD-10-CM | POA: Diagnosis not present

## 2021-12-24 DIAGNOSIS — E7800 Pure hypercholesterolemia, unspecified: Secondary | ICD-10-CM | POA: Diagnosis not present

## 2021-12-24 DIAGNOSIS — Z79899 Other long term (current) drug therapy: Secondary | ICD-10-CM | POA: Diagnosis not present

## 2021-12-30 ENCOUNTER — Ambulatory Visit (INDEPENDENT_AMBULATORY_CARE_PROVIDER_SITE_OTHER): Payer: Medicare PPO

## 2021-12-30 DIAGNOSIS — I480 Paroxysmal atrial fibrillation: Secondary | ICD-10-CM

## 2021-12-31 LAB — CUP PACEART REMOTE DEVICE CHECK
Date Time Interrogation Session: 20230328141008
Implantable Pulse Generator Implant Date: 20210208

## 2022-01-01 DIAGNOSIS — I700 Atherosclerosis of aorta: Secondary | ICD-10-CM | POA: Diagnosis not present

## 2022-01-01 DIAGNOSIS — L650 Telogen effluvium: Secondary | ICD-10-CM | POA: Diagnosis not present

## 2022-01-02 ENCOUNTER — Other Ambulatory Visit: Payer: Self-pay | Admitting: Internal Medicine

## 2022-01-02 DIAGNOSIS — I700 Atherosclerosis of aorta: Secondary | ICD-10-CM

## 2022-01-04 IMAGING — CT CT CHEST LUNG CANCER SCREENING LOW DOSE W/O CM
1 of 2 series · 10 of 20 positions shown, 13 images · non-contrast
Comparison: 07/15/2019 screening chest CT.

CLINICAL DATA: 69-year-old asymptomatic female former smoker with
66 pack-year smoking history, quit smoking 6 years prior.

EXAM:
CT CHEST WITHOUT CONTRAST LOW-DOSE FOR LUNG CANCER SCREENING
TECHNIQUE: Multidetector CT imaging of the chest was performed following the
standard protocol without IV contrast.

[ct lung segmentation data · axial · 0.68mm/px · z∈[-314,-314]mm · 10 of 291 frames shown]
[frame 1/291  mediastinal]
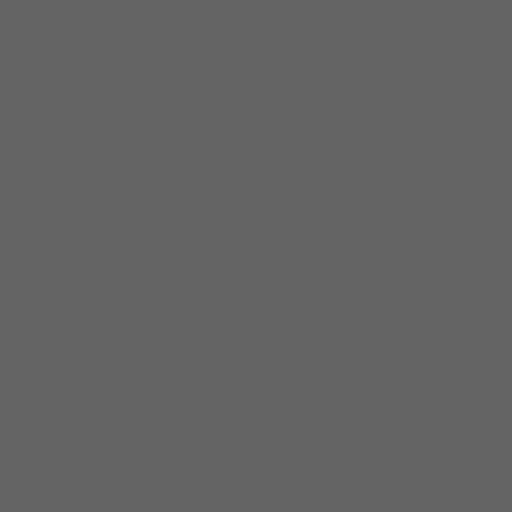
[frame 1/291  lung]
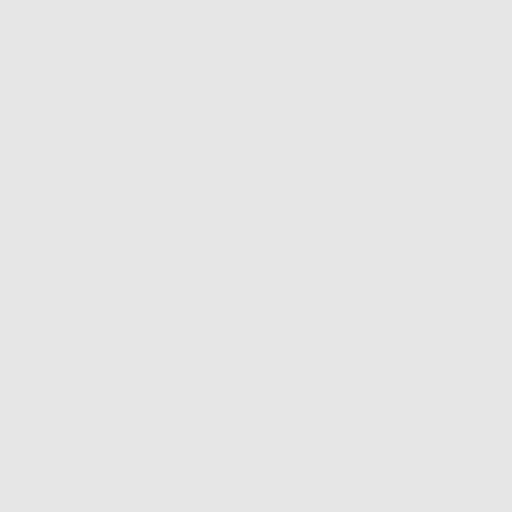
[frame 33/291  lung]
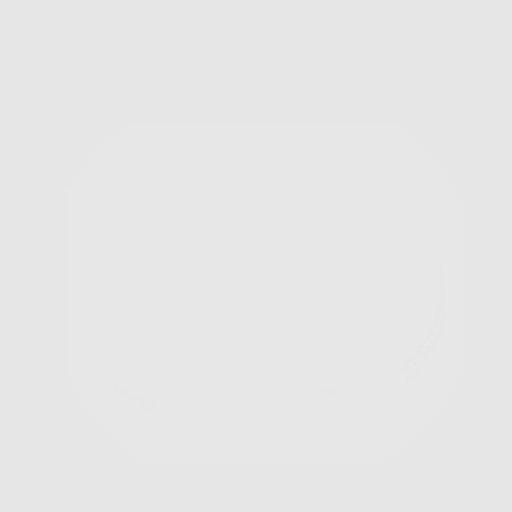
[frame 65/291  lung]
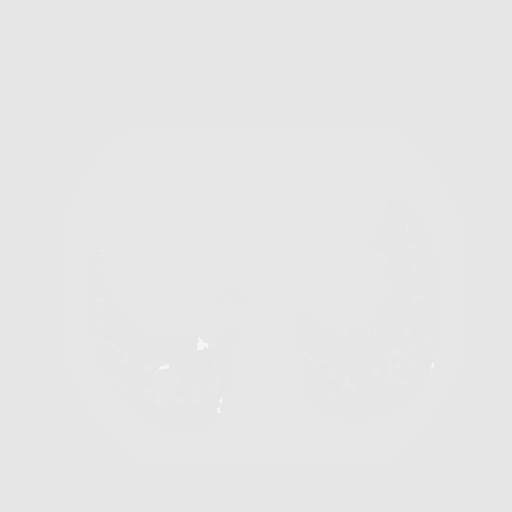
[frame 97/291  lung]
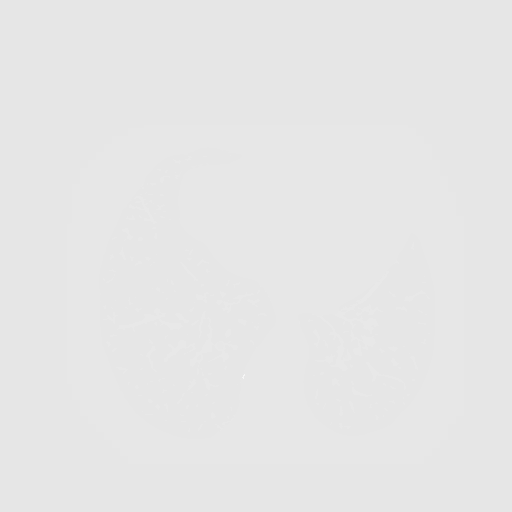
[frame 129/291  mediastinal]
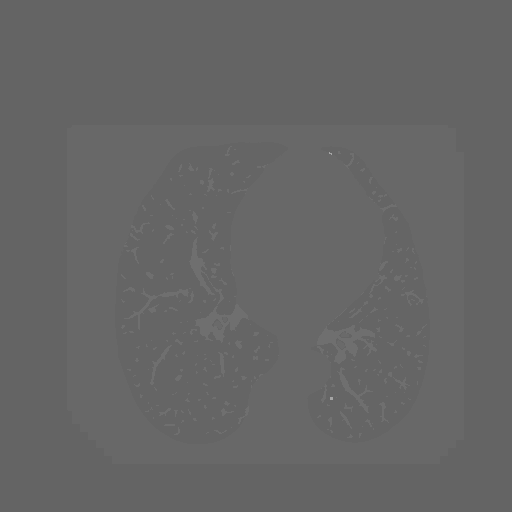
[frame 129/291  lung]
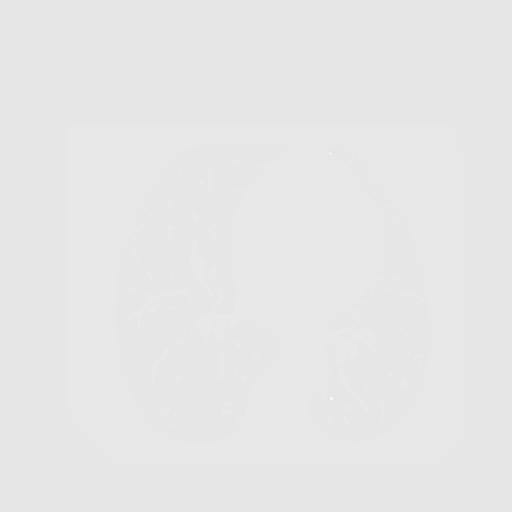
[frame 162/291  lung]
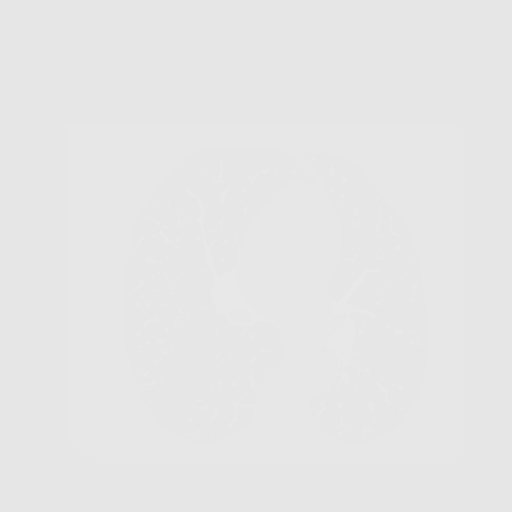
[frame 194/291  lung]
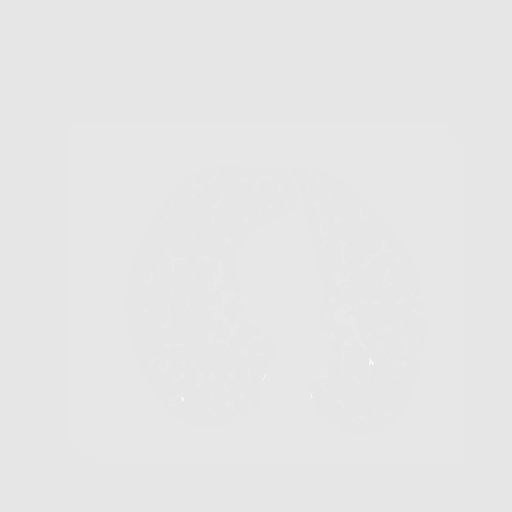
[frame 226/291  lung]
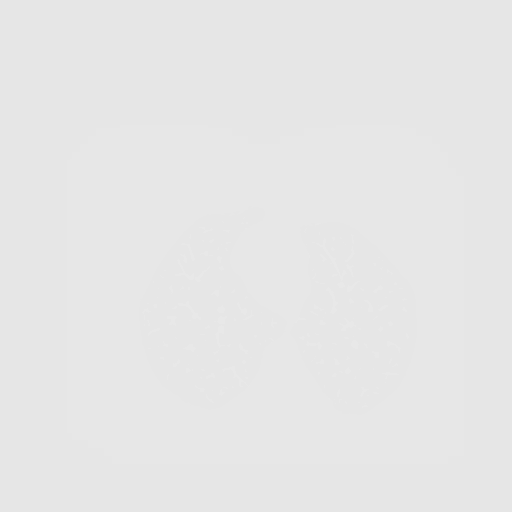
[frame 258/291  mediastinal]
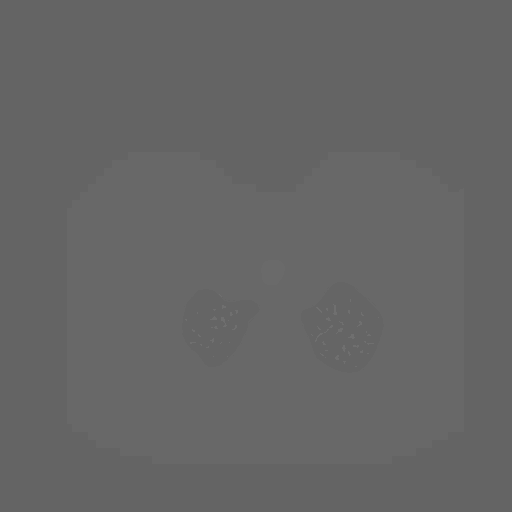
[frame 258/291  lung]
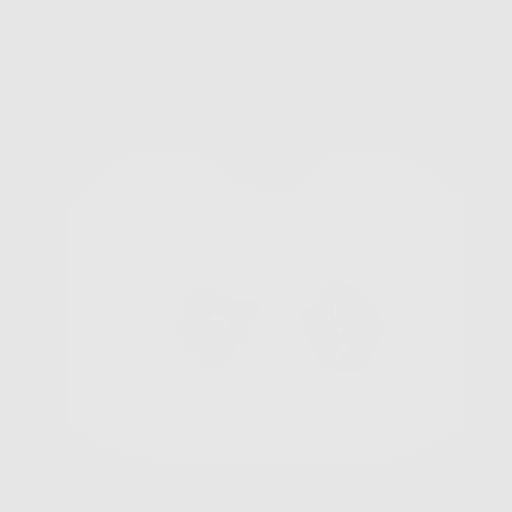
[frame 291/291  lung]
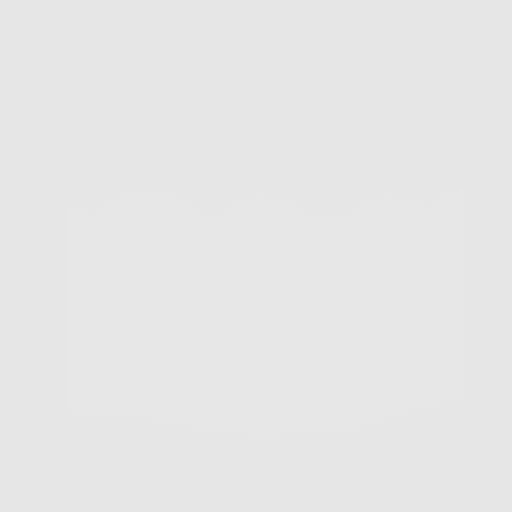

[10 of 20 positions shown; findings below may reference images not displayed]

FINDINGS: Cardiovascular: Normal heart size. No significant pericardial
effusion/thickening. Three-vessel coronary atherosclerosis.
Atherosclerotic nonaneurysmal thoracic aorta. Normal caliber
pulmonary arteries.

Mediastinum/Nodes: No discrete thyroid nodules. Unremarkable
esophagus. No pathologically enlarged axillary, mediastinal or hilar
lymph nodes, noting limited sensitivity for the detection of hilar
adenopathy on this noncontrast study.

Lungs/Pleura: No pneumothorax. No pleural effusion. Mild
centrilobular emphysema with mild diffuse bronchial wall thickening.
No acute consolidative airspace disease or lung masses. No
significant pulmonary nodules. Nonspecific mild patchy subpleural
reticulation and ground-glass opacity in the lower lungs
bilaterally, slightly increased from prior.

Upper abdomen: Diffuse hepatic steatosis.

Musculoskeletal: No aggressive appearing focal osseous lesions.
Marked thoracic spondylosis. Subcutaneous loop recorder in the
medial ventral lower left chest wall.
IMPRESSION: 1. Lung-RADS 1, negative. Continue annual screening with low-dose
chest CT without contrast in 12 months.
2. Nonspecific patchy subpleural reticulation and ground-glass
opacity in the lower lungs bilaterally, slightly increased. If there
is clinical concern for a developing interstitial lung disease,
pulmonology consultation and high-resolution chest CT may be
obtained for further evaluation.
3. Three-vessel coronary atherosclerosis.
4. Diffuse hepatic steatosis.
5. Aortic Atherosclerosis (KNOJA-T9O.O) and Emphysema (KNOJA-4CN.B).

## 2022-01-09 NOTE — Progress Notes (Signed)
Carelink Summary Report / Loop Recorder 

## 2022-01-13 ENCOUNTER — Other Ambulatory Visit (HOSPITAL_BASED_OUTPATIENT_CLINIC_OR_DEPARTMENT_OTHER): Payer: Self-pay | Admitting: Internal Medicine

## 2022-01-13 DIAGNOSIS — I350 Nonrheumatic aortic (valve) stenosis: Secondary | ICD-10-CM

## 2022-01-31 ENCOUNTER — Inpatient Hospital Stay: Admission: RE | Admit: 2022-01-31 | Payer: Medicare PPO | Source: Ambulatory Visit

## 2022-02-03 ENCOUNTER — Ambulatory Visit (INDEPENDENT_AMBULATORY_CARE_PROVIDER_SITE_OTHER): Payer: Medicare PPO

## 2022-02-03 DIAGNOSIS — I480 Paroxysmal atrial fibrillation: Secondary | ICD-10-CM | POA: Diagnosis not present

## 2022-02-04 LAB — CUP PACEART REMOTE DEVICE CHECK
Date Time Interrogation Session: 20230502083905
Implantable Pulse Generator Implant Date: 20210208

## 2022-02-05 ENCOUNTER — Ambulatory Visit: Payer: Medicare PPO | Admitting: Dermatology

## 2022-02-05 ENCOUNTER — Encounter: Payer: Self-pay | Admitting: Dermatology

## 2022-02-05 DIAGNOSIS — Z1283 Encounter for screening for malignant neoplasm of skin: Secondary | ICD-10-CM

## 2022-02-05 DIAGNOSIS — R202 Paresthesia of skin: Secondary | ICD-10-CM | POA: Diagnosis not present

## 2022-02-05 DIAGNOSIS — L821 Other seborrheic keratosis: Secondary | ICD-10-CM | POA: Diagnosis not present

## 2022-02-05 DIAGNOSIS — L851 Acquired keratosis [keratoderma] palmaris et plantaris: Secondary | ICD-10-CM | POA: Diagnosis not present

## 2022-02-05 DIAGNOSIS — L820 Inflamed seborrheic keratosis: Secondary | ICD-10-CM

## 2022-02-10 ENCOUNTER — Ambulatory Visit (HOSPITAL_COMMUNITY): Payer: Medicare PPO | Attending: Internal Medicine

## 2022-02-18 NOTE — Progress Notes (Signed)
Carelink Summary Report / Loop Recorder 

## 2022-02-22 ENCOUNTER — Encounter: Payer: Self-pay | Admitting: Dermatology

## 2022-02-22 NOTE — Progress Notes (Signed)
   New Patient   Subjective  Kathryn Booker is a 71 y.o. female who presents for the following: Annual Exam (Dark lesion on left leg- + itch).  Skin check, concern with spots on legs Location:  Duration:  Quality:  Associated Signs/Symptoms: Modifying Factors:  Severity:  Timing: Context:    The following portions of the chart were reviewed this encounter and updated as appropriate:  Tobacco  Allergies  Meds  Problems  Med Hx  Surg Hx  Fam Hx      Objective  Well appearing patient in no apparent distress; mood and affect are within normal limits. Full body skin exam.  No atypical pigmented lesions (all checked with dermoscopy), no current nonmelanoma skin cancers  Left Knee - Anterior Brown 7 mm textured flattopped papule, compatible dermoscopy  Neck - Posterior Itching without visible rash or growth  Mid Back Flat textured brown 8 mm lesion  Left Knee - Anterior, Left Lower Leg - Anterior, Right Lower Leg - Anterior Half dozen inflamed 4 to 6 mm keratoses, favor I-S K over D SAP or CIS    A full examination was performed including scalp, head, eyes, ears, nose, lips, neck, chest, axillae, abdomen, back, buttocks, bilateral upper extremities, bilateral lower extremities, hands, feet, fingers, toes, fingernails, and toenails. All findings within normal limits unless otherwise noted below.  Is beneath undergarments not fully examined.   Assessment & Plan  Seborrheic keratosis Left Knee - Anterior  Leave if stable  Screening for malignant neoplasm of skin  Annual skin examination  Notalgia paresthetica Neck - Posterior  Anti itch cream cereve- pramoxine   Stucco keratosis Mid Back  Leave if stable  Inflamed seborrheic keratosis (3) Left Knee - Anterior; Left Lower Leg - Anterior; Right Lower Leg - Anterior  If freezing does not help return for possible biopsies  Destruction of lesion - Left Knee - Anterior, Left Lower Leg - Anterior, Right  Lower Leg - Anterior Complexity: simple   Destruction method: cryotherapy   Informed consent: discussed and consent obtained   Timeout:  patient name, date of birth, surgical site, and procedure verified Lesion destroyed using liquid nitrogen: Yes   Cryotherapy cycles:  4 Outcome: patient tolerated procedure well with no complications   Post-procedure details: wound care instructions given

## 2022-03-07 DIAGNOSIS — M25512 Pain in left shoulder: Secondary | ICD-10-CM | POA: Diagnosis not present

## 2022-03-10 ENCOUNTER — Ambulatory Visit (INDEPENDENT_AMBULATORY_CARE_PROVIDER_SITE_OTHER): Payer: Medicare PPO

## 2022-03-10 DIAGNOSIS — I480 Paroxysmal atrial fibrillation: Secondary | ICD-10-CM

## 2022-03-11 LAB — CUP PACEART REMOTE DEVICE CHECK
Date Time Interrogation Session: 20230606093352
Implantable Pulse Generator Implant Date: 20210208

## 2022-03-27 NOTE — Progress Notes (Signed)
Carelink Summary Report / Loop Recorder 

## 2022-04-11 ENCOUNTER — Encounter: Payer: Self-pay | Admitting: Internal Medicine

## 2022-04-11 ENCOUNTER — Ambulatory Visit (INDEPENDENT_AMBULATORY_CARE_PROVIDER_SITE_OTHER): Payer: Medicare PPO | Admitting: Internal Medicine

## 2022-04-11 VITALS — BP 110/56 | HR 47 | Ht 63.0 in | Wt 134.2 lb

## 2022-04-11 DIAGNOSIS — I480 Paroxysmal atrial fibrillation: Secondary | ICD-10-CM | POA: Diagnosis not present

## 2022-04-11 DIAGNOSIS — I10 Essential (primary) hypertension: Secondary | ICD-10-CM | POA: Diagnosis not present

## 2022-04-11 DIAGNOSIS — I493 Ventricular premature depolarization: Secondary | ICD-10-CM

## 2022-04-11 NOTE — Progress Notes (Signed)
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 female who presents today for routine electrophysiology followup.  Since last being seen in our clinic, the patient reports doing very well.  Her afib is well controlled.  She has had symptomatic PVCs.  She reports some fatigue.  She has also noticed on her home BP cuff that it did not detect her PVCs and gave a falsely depressed heart rate.  Today, she denies symptoms of palpitations, chest pain, shortness of breath,  lower extremity edema, dizziness, presyncope, or syncope.  The patient is otherwise without complaint today.   Past Medical History:  Diagnosis Date   Allergies    Anxiety    Asthma    COPD (chronic obstructive pulmonary disease) (HCC)    Coronary artery calcification seen on CT scan    Depression    Fatigue    Former tobacco use    Hyperlipidemia    Paroxysmal atrial fibrillation (HCC)    Pre-diabetes    RBBB (right bundle branch block with left anterior fascicular block)    Scoliosis    Serum calcium elevated    Thyroid disease    "questionable" per patient   Past Surgical History:  Procedure Laterality Date   APPENDECTOMY     BREAST EXCISIONAL BIOPSY Right 2017   benign   implantable loop recorder placement  11/14/2019   Medtronic Reveal Linq model LNQ 2 (SN [id number]) implantable loop recorder    LAPROSCOPIC     X 2 IN EARLY 30-40'S   OVARIAN CYST REMOVAL     RADIOACTIVE SEED GUIDED EXCISIONAL BREAST BIOPSY Right 05/15/2016   Procedure: RIGHT RADIOACTIVE SEED GUIDED EXCISIONAL BREAST BIOPSY;  Surgeon: Rolm Bookbinder, MD;  Location: [facility];  Service: General;  Laterality: Right;  RIGHT RADIOACTIVE SEED GUIDED EXCISIONAL BREAST BIOPSY   TONSILECTOMY, ADENOIDECTOMY, BILATERAL MYRINGOTOMY AND TUBES      ROS- all systems are reviewed and negatives except as per HPI above  Current Outpatient Medications  Medication Sig Dispense Refill   albuterol  (PROVENTIL HFA;VENTOLIN HFA) 108 (90 Base) MCG/ACT inhaler Use 2 inhalations 15 minutes apart every 4 hours to rescue Asthma 3 Inhaler 3   buPROPion (WELLBUTRIN XL) 300 MG 24 hr tablet Take 1 tablet every Morning or Mood, Focus & Concentration 90 tablet 1   cholecalciferol (VITAMIN D3) 25 MCG (1000 UNIT) tablet Take 1,000 Units by mouth daily.      citalopram (CELEXA) 20 MG tablet Take 20 mg by mouth daily.     cyclobenzaprine (FLEXERIL) 10 MG tablet Take 10 mg by mouth at bedtime as needed.      diltiazem (CARDIZEM CD) 240 MG 24 hr capsule TAKE 1 CAPSULE BY MOUTH EVERY DAY 90 capsule 3   diltiazem (CARDIZEM) 30 MG tablet Take 1 tablet Daily for  rapid Heart Beat (Patient taking differently: Take 1 tablet Daily PRN for  rapid Heart Beat) 90 tablet 1   ezetimibe (ZETIA) 10 MG tablet TAKE 1 TABLET BY MOUTH EVERY DAY 90 tablet 1   magnesium oxide (MAG-OX) 400 MG tablet Take 400 mg by mouth daily.     OVER THE COUNTER MEDICATION Eye drop- 1 drop each eye as needed. Equate dry eye     rosuvastatin (CRESTOR) 5 MG tablet Take 5 mg by mouth daily.     Semaglutide,0.25 or 0.'5MG'$ /DOS, 2 MG/1.5ML SOPN Inject 0.375 mg into the skin.     valACYclovir (VALTREX) 500 MG tablet  Take 1 tablet Daily for Prevention Fever Blisters 90 tablet 3   XARELTO 20 MG TABS tablet TAKE 1 TABLET DAILY TO PREVENT BLOOD CLOTS 90 tablet 0   Omega-3 Fatty Acids (FISH OIL) 1000 MG CAPS Take 1 capsule by mouth. (Patient not taking: Reported on 04/11/2022)     No current facility-administered medications for this visit.    Physical Exam: Vitals:   04/11/22 1432  BP: (!) 110/56  Pulse: (!) 47  SpO2: 97%  Weight: 134 lb 3.2 oz (60.9 kg)  Height: '5\' 3"'$  (1.6 m)    GEN- The patient is well appearing, alert and oriented x 3 today.   Head- normocephalic, atraumatic Eyes-  Sclera clear, conjunctiva pink Ears- hearing intact Oropharynx- clear Lungs- Clear to ausculation bilaterally, normal work of breathing Heart- RRR with  ectopy GI- soft, NT, ND, + BS Extremities- no clubbing, cyanosis, or edema  Wt Readings from Last 3 Encounters:  04/11/22 134 lb 3.2 oz (60.9 kg)  07/17/21 137 lb 6.4 oz (62.3 kg)  02/19/21 150 lb 9.2 oz (68.3 kg)    EKG tracing ordered today is personally reviewed and shows sinus rhythm, RBBB, LAHB, frequent PVCs (RBB L inferior axis).  Assessment and Plan:  Paroxysmal atrial fibrillation Well controlled (burden is 0.4 %)  2. HTN Stable No change required today  3. OSA Compliant with CPAP  4. Overweight She has been very successful with weight reduction Body mass index is 23.77 kg/m.  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 We discussed options of AAD, nadolol, or ablation.  Given bifascicular block, I would not be excited about Ics,  she did not tolerate multaq due to concerns for hair loss We will consider nadolol on return Given relatively low burden, I am not convinced that ablation would be beneficial. Echo 2021 reviewed  Return in 4-6 weeks  Kathryn Grayer MD, [facility] 04/11/2022 3:17 PM

## 2022-04-11 NOTE — Patient Instructions (Signed)
Medication Instructions:  Continue all current medications.  Labwork: none  Testing/Procedures: none  Follow-Up: 4 weeks - [place]   Any Other Special Instructions Will Be Listed Below (If Applicable).   If you need a refill on your cardiac medications before your next appointment, please call your pharmacy.

## 2022-04-14 ENCOUNTER — Ambulatory Visit (INDEPENDENT_AMBULATORY_CARE_PROVIDER_SITE_OTHER): Payer: Medicare PPO

## 2022-04-14 DIAGNOSIS — I480 Paroxysmal atrial fibrillation: Secondary | ICD-10-CM | POA: Diagnosis not present

## 2022-04-14 LAB — CUP PACEART REMOTE DEVICE CHECK
Date Time Interrogation Session: 20230710090126
Implantable Pulse Generator Implant Date: 20210208

## 2022-04-14 NOTE — Addendum Note (Signed)
Addended by: Laurine Blazer on: 04/14/2022 09:17 AM   Modules accepted: Orders

## 2022-04-16 DIAGNOSIS — N3001 Acute cystitis with hematuria: Secondary | ICD-10-CM | POA: Diagnosis not present

## 2022-04-16 DIAGNOSIS — R319 Hematuria, unspecified: Secondary | ICD-10-CM | POA: Diagnosis not present

## 2022-05-07 DIAGNOSIS — H53021 Refractive amblyopia, right eye: Secondary | ICD-10-CM | POA: Diagnosis not present

## 2022-05-07 DIAGNOSIS — H25811 Combined forms of age-related cataract, right eye: Secondary | ICD-10-CM | POA: Diagnosis not present

## 2022-05-07 DIAGNOSIS — Z961 Presence of intraocular lens: Secondary | ICD-10-CM | POA: Diagnosis not present

## 2022-05-07 DIAGNOSIS — E119 Type 2 diabetes mellitus without complications: Secondary | ICD-10-CM | POA: Diagnosis not present

## 2022-05-07 DIAGNOSIS — H40013 Open angle with borderline findings, low risk, bilateral: Secondary | ICD-10-CM | POA: Diagnosis not present

## 2022-05-07 DIAGNOSIS — H04123 Dry eye syndrome of bilateral lacrimal glands: Secondary | ICD-10-CM | POA: Diagnosis not present

## 2022-05-07 DIAGNOSIS — H26492 Other secondary cataract, left eye: Secondary | ICD-10-CM | POA: Diagnosis not present

## 2022-05-14 NOTE — Progress Notes (Signed)
Carelink Summary Report / Loop Recorder 

## 2022-05-19 ENCOUNTER — Ambulatory Visit: Payer: Medicare PPO

## 2022-05-21 ENCOUNTER — Encounter: Payer: Self-pay | Admitting: Internal Medicine

## 2022-05-21 LAB — CUP PACEART REMOTE DEVICE CHECK
Date Time Interrogation Session: 20230815145946
Implantable Pulse Generator Implant Date: 20210208

## 2022-05-23 ENCOUNTER — Ambulatory Visit: Payer: Medicare PPO | Admitting: Internal Medicine

## 2022-05-23 VITALS — BP 102/62 | HR 65 | Ht 63.0 in | Wt 135.0 lb

## 2022-05-23 DIAGNOSIS — I480 Paroxysmal atrial fibrillation: Secondary | ICD-10-CM | POA: Diagnosis not present

## 2022-05-23 DIAGNOSIS — I10 Essential (primary) hypertension: Secondary | ICD-10-CM | POA: Diagnosis not present

## 2022-05-23 NOTE — Progress Notes (Signed)
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 female who presents today for routine electrophysiology followup.  Since last being seen in our clinic, the patient reports doing very well.  Today, she denies symptoms of palpitations, chest pain, shortness of breath,  lower extremity edema, dizziness, presyncope, or syncope.  The patient is otherwise without complaint today.   Past Medical History:  Diagnosis Date   Allergies    Anxiety    Asthma    COPD (chronic obstructive pulmonary disease) (HCC)    Coronary artery calcification seen on CT scan    Depression    Fatigue    Former tobacco use    Hyperlipidemia    Paroxysmal atrial fibrillation (HCC)    Pre-diabetes    RBBB (right bundle branch block with left anterior fascicular block)    Scoliosis    Serum calcium elevated    Thyroid disease    "questionable" per patient   Past Surgical History:  Procedure Laterality Date   APPENDECTOMY     BREAST EXCISIONAL BIOPSY Right 2017   benign   implantable loop recorder placement  11/14/2019   Medtronic Reveal Linq model LNQ 2 (SN [id number]) implantable loop recorder    LAPROSCOPIC     X 2 IN EARLY 30-40'S   OVARIAN CYST REMOVAL     RADIOACTIVE SEED GUIDED EXCISIONAL BREAST BIOPSY Right 05/15/2016   Procedure: RIGHT RADIOACTIVE SEED GUIDED EXCISIONAL BREAST BIOPSY;  Surgeon: Rolm Bookbinder, MD;  Location: [place];  Service: General;  Laterality: Right;  RIGHT RADIOACTIVE SEED GUIDED EXCISIONAL BREAST BIOPSY   TONSILECTOMY, ADENOIDECTOMY, BILATERAL MYRINGOTOMY AND TUBES      ROS- all systems are reviewed and negatives except as per HPI above  Current Outpatient Medications  Medication Sig Dispense Refill   albuterol (PROVENTIL HFA;VENTOLIN HFA) 108 (90 Base) MCG/ACT inhaler Use 2 inhalations 15 minutes apart every 4 hours to rescue Asthma 3 Inhaler 3   buPROPion (WELLBUTRIN XL) 300 MG 24 hr tablet Take 1 tablet every  Morning or Mood, Focus & Concentration 90 tablet 1   cholecalciferol (VITAMIN D3) 25 MCG (1000 UNIT) tablet Take 1,000 Units by mouth daily.      citalopram (CELEXA) 20 MG tablet Take 20 mg by mouth daily.     cyclobenzaprine (FLEXERIL) 10 MG tablet Take 10 mg by mouth at bedtime as needed.      diltiazem (CARDIZEM CD) 240 MG 24 hr capsule TAKE 1 CAPSULE BY MOUTH EVERY DAY 90 capsule 3   ezetimibe (ZETIA) 10 MG tablet TAKE 1 TABLET BY MOUTH EVERY DAY 90 tablet 1   magnesium oxide (MAG-OX) 400 MG tablet Take 400 mg by mouth daily.     Omega-3 Fatty Acids (FISH OIL) 1000 MG CAPS Take 1 capsule by mouth.     OVER THE COUNTER MEDICATION Eye drop- 1 drop each eye as needed. Equate dry eye     rosuvastatin (CRESTOR) 5 MG tablet Take 5 mg by mouth daily.     Semaglutide,0.25 or 0.'5MG'$ /DOS, 2 MG/1.5ML SOPN Inject 0.375 mg into the skin.     valACYclovir (VALTREX) 500 MG tablet Take 1 tablet Daily for Prevention Fever Blisters 90 tablet 3   XARELTO 20 MG TABS tablet TAKE 1 TABLET DAILY TO PREVENT BLOOD CLOTS 90 tablet 0   diltiazem (CARDIZEM) 30 MG tablet Take 1 tablet Daily for  rapid Heart Beat (Patient not taking: Reported on 05/23/2022) 90 tablet 1  No current facility-administered medications for this visit.    Physical Exam: Vitals:   05/23/22 1517  BP: 102/62  Pulse: 65  SpO2: 98%  Weight: 135 lb (61.2 kg)  Height: '5\' 3"'$  (1.6 m)    GEN- The patient is well appearing, alert and oriented x 3 today.   Head- normocephalic, atraumatic Eyes-  Sclera clear, conjunctiva pink Ears- hearing intact Oropharynx- clear Lungs- Clear to ausculation bilaterally, normal work of breathing Heart- Regular rate and rhythm, no murmurs, rubs or gallops, PMI not laterally displaced GI- soft, NT, ND, + BS Extremities- no clubbing, cyanosis, or edema  Wt Readings from Last 3 Encounters:  05/23/22 135 lb (61.2 kg)  04/11/22 134 lb 3.2 oz (60.9 kg)  07/17/21 137 lb 6.4 oz (62.3 kg)     Assessment and  Plan:  Paroxysmal atrial fibrillation Controlled (burden 1.6% by ILR)  2. PVCs Burden is 5 % by ILR Could consider nadolol if symptoms worsen.  She is pleased with current state and does not wish to make changes today.  Given AV conduction disease, I would not advise IC AADs  3 . HTN Stable No change required today  4. OSA Compliant with CPAP  Risks, benefits and potential toxicities for medications prescribed and/or refilled reviewed with patient today.   Return to see EP APP in a year  Thompson Grayer MD, [facility] 05/23/2022 3:40 PM

## 2022-05-23 NOTE — Patient Instructions (Addendum)
Medication Instructions:  Your physician recommends that you continue on your current medications as directed. Please refer to the Current Medication list given to you today.  *If you need a refill on your cardiac medications before your next appointment, please call your pharmacy*  Lab Work: None ordered.  If you have labs (blood work) drawn today and your tests are completely normal, you will receive your results only by: [place] (if you have MyChart) OR A paper copy in the mail If you have any lab test that is abnormal or we need to change your treatment, we will call you to review the results.  Testing/Procedures: None ordered.  Follow-Up:  IN [place], Martha.  Important Information About Sugar

## 2022-06-05 DIAGNOSIS — M810 Age-related osteoporosis without current pathological fracture: Secondary | ICD-10-CM | POA: Diagnosis not present

## 2022-06-05 DIAGNOSIS — I2510 Atherosclerotic heart disease of native coronary artery without angina pectoris: Secondary | ICD-10-CM | POA: Diagnosis not present

## 2022-06-05 DIAGNOSIS — R7303 Prediabetes: Secondary | ICD-10-CM | POA: Diagnosis not present

## 2022-06-12 DIAGNOSIS — M810 Age-related osteoporosis without current pathological fracture: Secondary | ICD-10-CM | POA: Diagnosis not present

## 2022-06-12 DIAGNOSIS — K219 Gastro-esophageal reflux disease without esophagitis: Secondary | ICD-10-CM | POA: Diagnosis not present

## 2022-06-12 DIAGNOSIS — J432 Centrilobular emphysema: Secondary | ICD-10-CM | POA: Diagnosis not present

## 2022-06-12 DIAGNOSIS — F339 Major depressive disorder, recurrent, unspecified: Secondary | ICD-10-CM | POA: Diagnosis not present

## 2022-06-12 DIAGNOSIS — Z23 Encounter for immunization: Secondary | ICD-10-CM | POA: Diagnosis not present

## 2022-06-12 DIAGNOSIS — I480 Paroxysmal atrial fibrillation: Secondary | ICD-10-CM | POA: Diagnosis not present

## 2022-06-12 DIAGNOSIS — Z0000 Encounter for general adult medical examination without abnormal findings: Secondary | ICD-10-CM | POA: Diagnosis not present

## 2022-06-12 DIAGNOSIS — J4521 Mild intermittent asthma with (acute) exacerbation: Secondary | ICD-10-CM | POA: Diagnosis not present

## 2022-06-12 DIAGNOSIS — N393 Stress incontinence (female) (male): Secondary | ICD-10-CM | POA: Diagnosis not present

## 2022-06-23 ENCOUNTER — Ambulatory Visit (INDEPENDENT_AMBULATORY_CARE_PROVIDER_SITE_OTHER): Payer: Medicare PPO

## 2022-06-23 DIAGNOSIS — I480 Paroxysmal atrial fibrillation: Secondary | ICD-10-CM

## 2022-06-24 LAB — CUP PACEART REMOTE DEVICE CHECK
Date Time Interrogation Session: 20230919081124
Implantable Pulse Generator Implant Date: 20210208

## 2022-06-25 ENCOUNTER — Other Ambulatory Visit: Payer: Self-pay | Admitting: Internal Medicine

## 2022-06-25 DIAGNOSIS — Z1231 Encounter for screening mammogram for malignant neoplasm of breast: Secondary | ICD-10-CM

## 2022-07-05 NOTE — Progress Notes (Signed)
Carelink Summary Report / Loop Recorder 

## 2022-07-22 ENCOUNTER — Ambulatory Visit: Payer: Medicare PPO

## 2022-07-28 ENCOUNTER — Ambulatory Visit (INDEPENDENT_AMBULATORY_CARE_PROVIDER_SITE_OTHER): Payer: Medicare PPO

## 2022-07-28 DIAGNOSIS — I480 Paroxysmal atrial fibrillation: Secondary | ICD-10-CM | POA: Diagnosis not present

## 2022-07-29 LAB — CUP PACEART REMOTE DEVICE CHECK
Date Time Interrogation Session: 20231020230444
Implantable Pulse Generator Implant Date: 20210208

## 2022-07-30 DIAGNOSIS — R3121 Asymptomatic microscopic hematuria: Secondary | ICD-10-CM | POA: Diagnosis not present

## 2022-07-30 DIAGNOSIS — N3021 Other chronic cystitis with hematuria: Secondary | ICD-10-CM | POA: Diagnosis not present

## 2022-07-30 DIAGNOSIS — N393 Stress incontinence (female) (male): Secondary | ICD-10-CM | POA: Diagnosis not present

## 2022-08-10 ENCOUNTER — Encounter: Payer: Self-pay | Admitting: Internal Medicine

## 2022-08-12 NOTE — Telephone Encounter (Signed)
Patient is following up due to not hearing back from anyone. She is hopeful for a call back soon. Patient would also like to know which EP provider she will be assigned to per her dx. Please advise.

## 2022-08-14 ENCOUNTER — Telehealth: Payer: Self-pay | Admitting: Cardiology

## 2022-08-14 NOTE — Telephone Encounter (Signed)
Left message to call back See telephone note today, 11/9, for more on this

## 2022-08-14 NOTE — Telephone Encounter (Signed)
Left message for pt on her number on file, asked to call back to discuss.   (Dr. Curt Bears recommends Diltiazem 240 mg daily)

## 2022-08-14 NOTE — Telephone Encounter (Signed)
Pt c/o medication issue:  1. Name of Medication:   diltiazem (CARDIZEM) 30 MG tablet  diltiazem (CARDIZEM CD) 240 MG 24 hr capsule   2. How are you currently taking this medication (dosage and times per day)?   Not taking  3. Are you having a reaction (difficulty breathing--STAT)?   N/A  4. What is your medication issue?    Caller stated patient was taken of these medication but is now wondering if she needs to restart the diltiazem or start taking nadolol for her increased PVCs as was previously recommended.  Can also speak with RN Danae Chen or RN Ander Purpura.

## 2022-08-19 DIAGNOSIS — R3129 Other microscopic hematuria: Secondary | ICD-10-CM | POA: Diagnosis not present

## 2022-08-19 DIAGNOSIS — R3121 Asymptomatic microscopic hematuria: Secondary | ICD-10-CM | POA: Diagnosis not present

## 2022-08-19 DIAGNOSIS — K5730 Diverticulosis of large intestine without perforation or abscess without bleeding: Secondary | ICD-10-CM | POA: Diagnosis not present

## 2022-08-20 NOTE — Progress Notes (Signed)
Carelink Summary Report / Loop Recorder 

## 2022-08-21 ENCOUNTER — Encounter: Payer: Self-pay | Admitting: Cardiovascular Disease

## 2022-08-21 NOTE — Telephone Encounter (Signed)
Error

## 2022-08-21 NOTE — Telephone Encounter (Signed)
Pt calling back for an update, she states she wishes to f/u with Dr. Myles Gip about this matter and she prefers to use Mychart messages if possible.

## 2022-08-22 ENCOUNTER — Ambulatory Visit
Admission: RE | Admit: 2022-08-22 | Discharge: 2022-08-22 | Disposition: A | Discharge status: Home / Self Care | Payer: Medicare PPO | Source: Ambulatory Visit | Attending: Internal Medicine | Admitting: Internal Medicine

## 2022-08-22 DIAGNOSIS — Z1231 Encounter for screening mammogram for malignant neoplasm of breast: Secondary | ICD-10-CM | POA: Diagnosis not present

## 2022-08-22 NOTE — Telephone Encounter (Signed)
Patient has spoken with her care management team at her PCP and has sorted out her medications. She does not have any further questions at this time.

## 2022-09-01 ENCOUNTER — Ambulatory Visit (INDEPENDENT_AMBULATORY_CARE_PROVIDER_SITE_OTHER): Payer: Medicare PPO

## 2022-09-01 DIAGNOSIS — I480 Paroxysmal atrial fibrillation: Secondary | ICD-10-CM | POA: Diagnosis not present

## 2022-09-02 LAB — CUP PACEART REMOTE DEVICE CHECK
Date Time Interrogation Session: 20231126231855
Implantable Pulse Generator Implant Date: 20210208

## 2022-10-01 DIAGNOSIS — M8589 Other specified disorders of bone density and structure, multiple sites: Secondary | ICD-10-CM | POA: Diagnosis not present

## 2022-10-08 ENCOUNTER — Ambulatory Visit (INDEPENDENT_AMBULATORY_CARE_PROVIDER_SITE_OTHER): Payer: Medicare PPO

## 2022-10-08 DIAGNOSIS — I480 Paroxysmal atrial fibrillation: Secondary | ICD-10-CM | POA: Diagnosis not present

## 2022-10-08 LAB — CUP PACEART REMOTE DEVICE CHECK
Date Time Interrogation Session: 20240102230836
Implantable Pulse Generator Implant Date: 20210208

## 2022-10-13 NOTE — Progress Notes (Signed)
Carelink Summary Report / Loop Recorder 

## 2022-10-14 ENCOUNTER — Other Ambulatory Visit: Payer: Self-pay | Admitting: Internal Medicine

## 2022-10-17 ENCOUNTER — Ambulatory Visit (HOSPITAL_COMMUNITY)
Admission: RE | Admit: 2022-10-17 | Discharge: 2022-10-17 | Disposition: A | Discharge status: Home / Self Care | Payer: Medicare PPO | Source: Ambulatory Visit | Attending: Acute Care | Admitting: Acute Care

## 2022-10-17 DIAGNOSIS — Z122 Encounter for screening for malignant neoplasm of respiratory organs: Secondary | ICD-10-CM | POA: Diagnosis not present

## 2022-10-17 DIAGNOSIS — I2510 Atherosclerotic heart disease of native coronary artery without angina pectoris: Secondary | ICD-10-CM | POA: Insufficient documentation

## 2022-10-17 DIAGNOSIS — J432 Centrilobular emphysema: Secondary | ICD-10-CM | POA: Insufficient documentation

## 2022-10-17 DIAGNOSIS — Z87891 Personal history of nicotine dependence: Secondary | ICD-10-CM | POA: Insufficient documentation

## 2022-10-17 DIAGNOSIS — I700 Atherosclerosis of aorta: Secondary | ICD-10-CM | POA: Insufficient documentation

## 2022-10-20 ENCOUNTER — Other Ambulatory Visit: Payer: Self-pay | Admitting: Acute Care

## 2022-10-20 DIAGNOSIS — Z87891 Personal history of nicotine dependence: Secondary | ICD-10-CM

## 2022-10-20 DIAGNOSIS — Z122 Encounter for screening for malignant neoplasm of respiratory organs: Secondary | ICD-10-CM

## 2022-10-30 DIAGNOSIS — I2510 Atherosclerotic heart disease of native coronary artery without angina pectoris: Secondary | ICD-10-CM | POA: Diagnosis not present

## 2022-10-30 DIAGNOSIS — E1159 Type 2 diabetes mellitus with other circulatory complications: Secondary | ICD-10-CM | POA: Diagnosis not present

## 2022-10-30 DIAGNOSIS — I480 Paroxysmal atrial fibrillation: Secondary | ICD-10-CM | POA: Diagnosis not present

## 2022-10-30 DIAGNOSIS — E7800 Pure hypercholesterolemia, unspecified: Secondary | ICD-10-CM | POA: Diagnosis not present

## 2022-10-30 DIAGNOSIS — M8589 Other specified disorders of bone density and structure, multiple sites: Secondary | ICD-10-CM | POA: Diagnosis not present

## 2022-11-03 NOTE — Progress Notes (Signed)
Carelink Summary Report / Loop Recorder

## 2022-11-04 DIAGNOSIS — H11123 Conjunctival concretions, bilateral: Secondary | ICD-10-CM | POA: Diagnosis not present

## 2022-11-04 DIAGNOSIS — H04123 Dry eye syndrome of bilateral lacrimal glands: Secondary | ICD-10-CM | POA: Diagnosis not present

## 2022-11-04 DIAGNOSIS — H1045 Other chronic allergic conjunctivitis: Secondary | ICD-10-CM | POA: Diagnosis not present

## 2022-11-10 ENCOUNTER — Ambulatory Visit: Payer: Medicare PPO

## 2022-11-10 DIAGNOSIS — I480 Paroxysmal atrial fibrillation: Secondary | ICD-10-CM

## 2022-11-11 LAB — CUP PACEART REMOTE DEVICE CHECK
Date Time Interrogation Session: 20240204231713
Implantable Pulse Generator Implant Date: 20210208

## 2022-11-13 ENCOUNTER — Encounter (HOSPITAL_COMMUNITY): Payer: Self-pay | Admitting: *Deleted

## 2022-12-09 ENCOUNTER — Ambulatory Visit: Payer: Self-pay | Admitting: *Deleted

## 2022-12-09 NOTE — Patient Outreach (Signed)
  Care Coordination   12/09/2022 Name: Kathryn Booker MRN: [id number] DOB: 08-Sep-1951   Care Coordination Outreach Attempts:  An unsuccessful telephone outreach was attempted today to offer the patient information about available care coordination services as a benefit of their health plan.   Follow Up Plan:  Additional outreach attempts will be made to offer the patient care coordination information and services.   Encounter Outcome:  No Answer   Care Coordination Interventions:  No, not indicated   Eduard Clos, MSW, [place] Worker [employer] [phone]

## 2022-12-15 ENCOUNTER — Ambulatory Visit (INDEPENDENT_AMBULATORY_CARE_PROVIDER_SITE_OTHER): Payer: Medicare PPO

## 2022-12-15 DIAGNOSIS — I480 Paroxysmal atrial fibrillation: Secondary | ICD-10-CM | POA: Diagnosis not present

## 2022-12-16 ENCOUNTER — Telehealth: Payer: Self-pay

## 2022-12-16 LAB — CUP PACEART REMOTE DEVICE CHECK
Date Time Interrogation Session: 20240310231714
Implantable Pulse Generator Implant Date: 20210208

## 2022-12-16 NOTE — Telephone Encounter (Signed)
ILR summary report received. Battery status OK. Normal device function. No new symptom, tachy, brady, or pause episodes.  17 new AF episodes, overall rates controlled.  Burden 6.3%, previous 5%, Xarelto '20mg'$ , Diltiazem scheduled and prn.   PVC's = 3.8%.   Monthly summary reports and ROV/PRN  Presenting appear regular rate ~150 - route to triage LA  LM to follow up with patient on how she is feeling and to discuss how she is taking her medications.  Also, would like to see updated presenting rhythm on a most recent transmission to re-evaluate fast presenting.

## 2022-12-17 ENCOUNTER — Telehealth: Payer: Self-pay

## 2022-12-17 NOTE — Telephone Encounter (Signed)
Patient has LINQ II sends updated EGM in real time every day, re-check of presenting Rhythm on 12/17/22 SR with PVCS

## 2022-12-17 NOTE — Telephone Encounter (Signed)
Kathryn Booker  spoke with the patient. She is back in normal rhythm.

## 2022-12-19 DIAGNOSIS — R3121 Asymptomatic microscopic hematuria: Secondary | ICD-10-CM | POA: Diagnosis not present

## 2022-12-24 NOTE — Progress Notes (Signed)
Carelink Summary Report / Loop Recorder 

## 2023-01-19 ENCOUNTER — Ambulatory Visit (INDEPENDENT_AMBULATORY_CARE_PROVIDER_SITE_OTHER): Payer: Medicare PPO

## 2023-01-19 DIAGNOSIS — I480 Paroxysmal atrial fibrillation: Secondary | ICD-10-CM

## 2023-01-19 LAB — CUP PACEART REMOTE DEVICE CHECK
Date Time Interrogation Session: 20240414231129
Implantable Pulse Generator Implant Date: 20210208

## 2023-01-22 NOTE — Progress Notes (Signed)
Carelink Summary Report / Loop Recorder 

## 2023-02-13 DIAGNOSIS — M791 Myalgia, unspecified site: Secondary | ICD-10-CM | POA: Diagnosis not present

## 2023-02-13 DIAGNOSIS — M25512 Pain in left shoulder: Secondary | ICD-10-CM | POA: Diagnosis not present

## 2023-02-23 ENCOUNTER — Ambulatory Visit (INDEPENDENT_AMBULATORY_CARE_PROVIDER_SITE_OTHER): Payer: Medicare PPO

## 2023-02-23 DIAGNOSIS — I480 Paroxysmal atrial fibrillation: Secondary | ICD-10-CM

## 2023-02-23 LAB — CUP PACEART REMOTE DEVICE CHECK
Date Time Interrogation Session: 20240517230841
Implantable Pulse Generator Implant Date: 20210208

## 2023-02-23 NOTE — Progress Notes (Signed)
Carelink Summary Report / Loop Recorder 

## 2023-03-04 NOTE — Telephone Encounter (Signed)
Spoke with patient, dates and times of AF episodes sent to patient in MyChart message

## 2023-03-04 NOTE — Telephone Encounter (Signed)
-----   Message from Baird Lyons, RN sent at 03/03/2023  5:49 PM EDT ----- Informed patient of results and verbal understanding expressed. Pt still has about a month of Diltiazem 240 mg and would prefer to finish out before the increase. Aware sending to MD for approval to wait for increase.  Aware I am only calling back if MD does not want her to wait on increase.  She also states that her May transmission summary report did not come through, she cannot read it like usual. Aware forwarding to device clinic to address this.  She mentions they can even mail it to her if can't sent via epic/mychart.

## 2023-03-21 IMAGING — CT CT CHEST LUNG CANCER SCREENING LOW DOSE W/O CM
2 of 4 series · 15 of 36 positions shown, 18 images · non-contrast
Comparison: Low-dose lung cancer screening chest CT 09/12/2020.

CLINICAL DATA: 70-year-old female former smoker (quit in 9308) with
66 pack-year history of smoking. Lung cancer screening examination.



[Series 3: lung thins 1.0 · axial · 0.62mm/px · z∈[-302,-36]mm · 12 of 294 slices shown, 15 images]
[im 14/294  mediastinal]
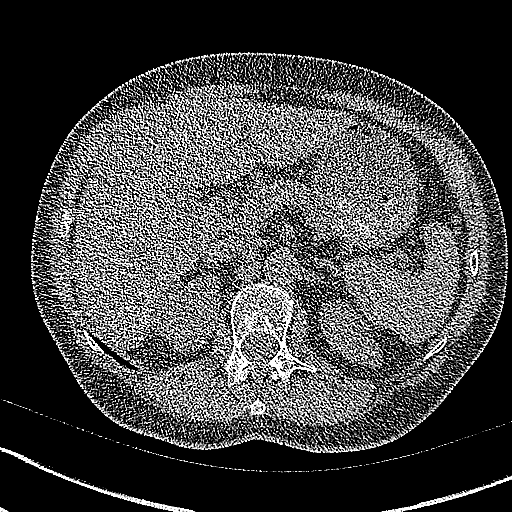
[im 14/294  lung]
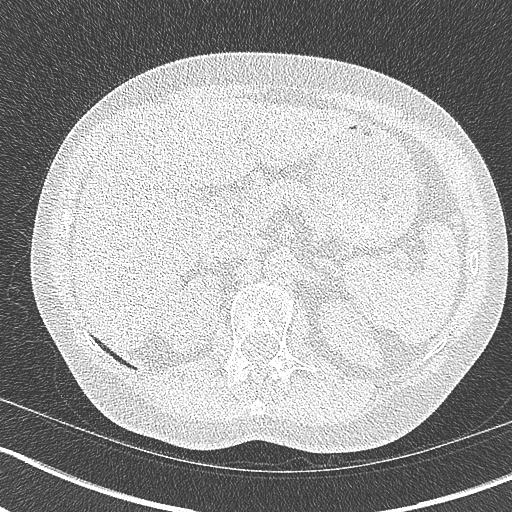
[im 40/294  lung]
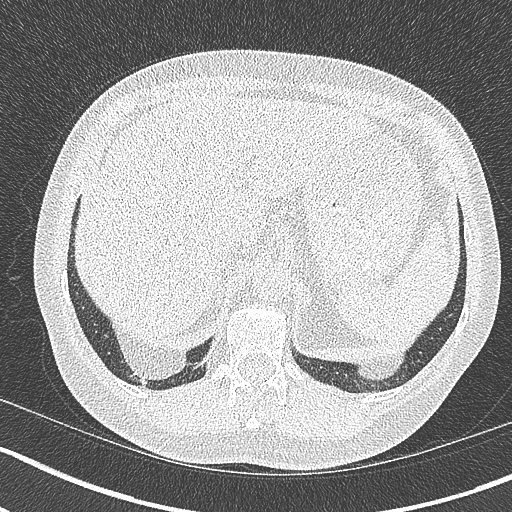
[im 67/294  lung]
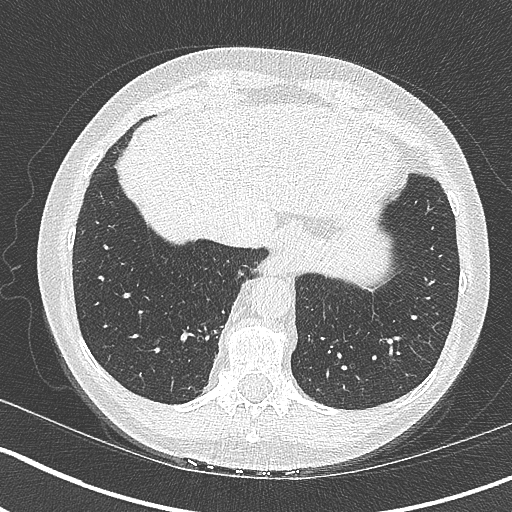
[im 94/294  lung]
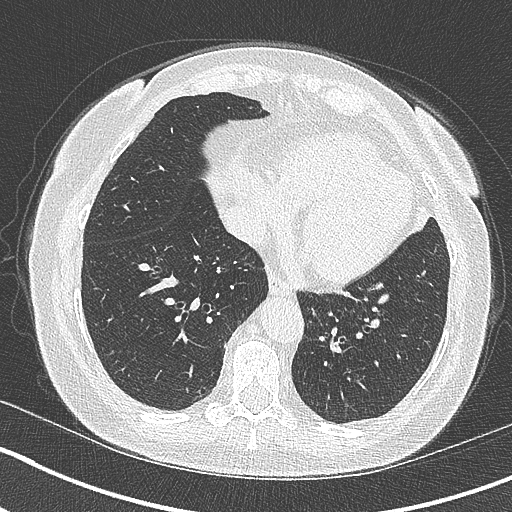
[im 107/294  mediastinal]
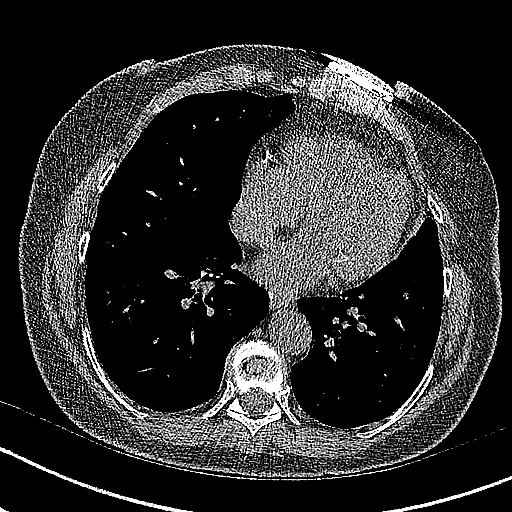
[im 107/294  lung]
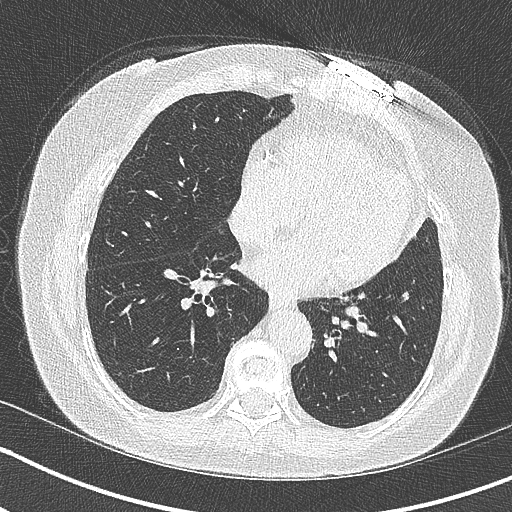
[im 134/294  lung]
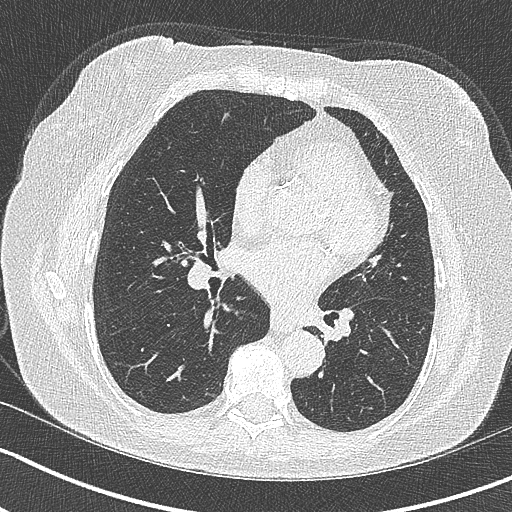
[im 160/294  lung]
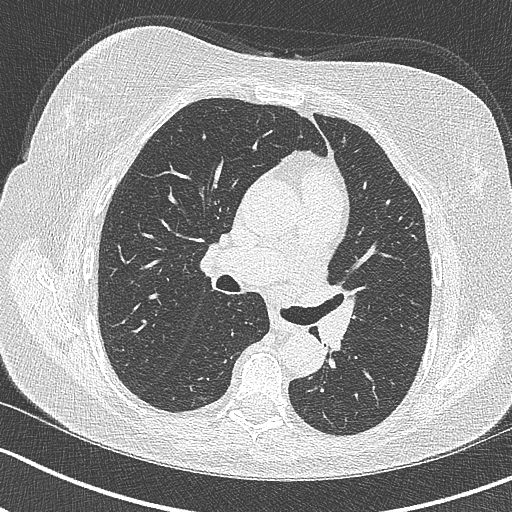
[im 187/294  lung]
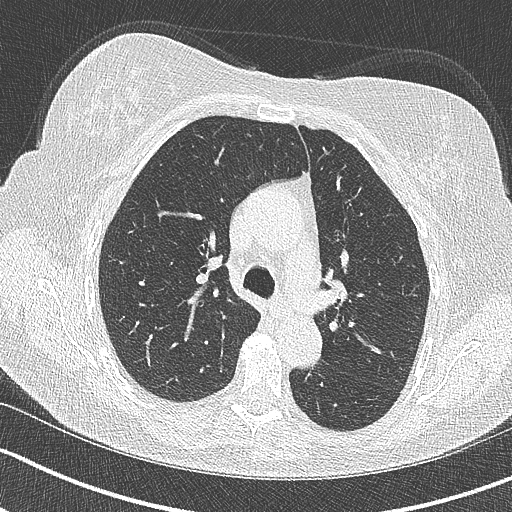
[im 200/294  mediastinal]
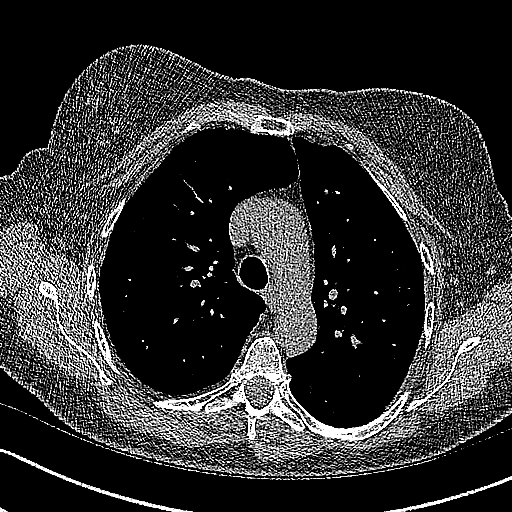
[im 200/294  lung]
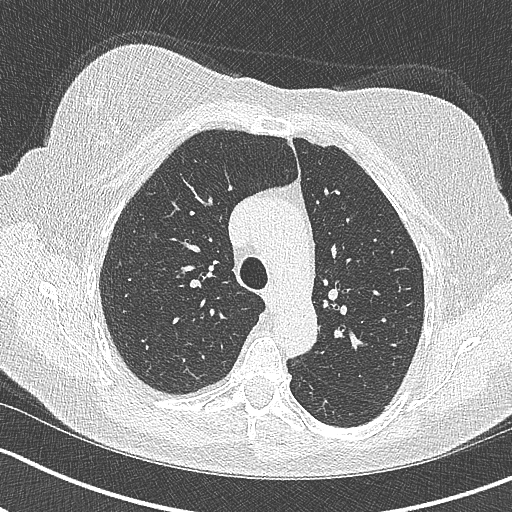
[im 227/294  lung]
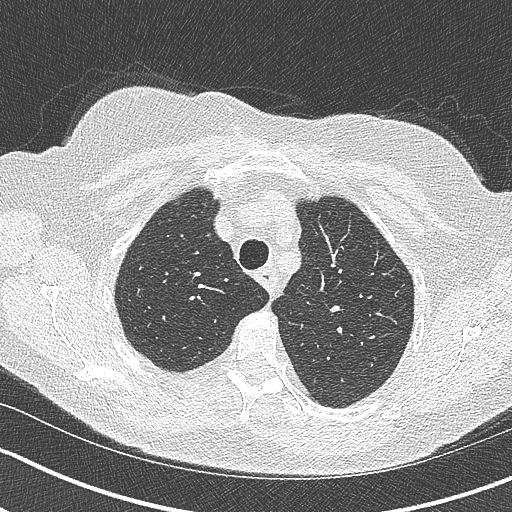
[im 254/294  lung]
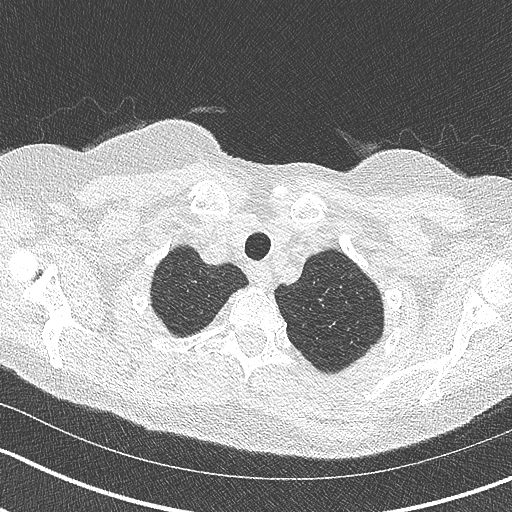
[im 280/294  lung]
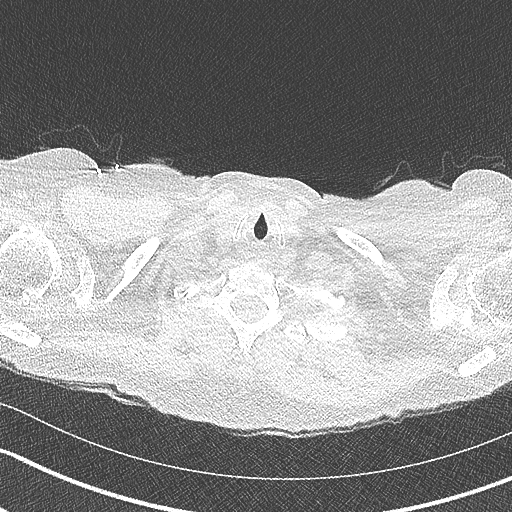

[Series 5: coronal · coronal · 0.56mm/px · 3 of 127 slices shown]
[im 26/127  lung]
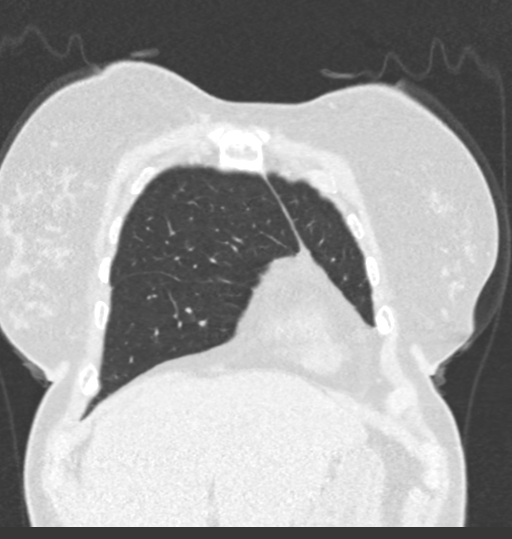
[im 51/127  lung]
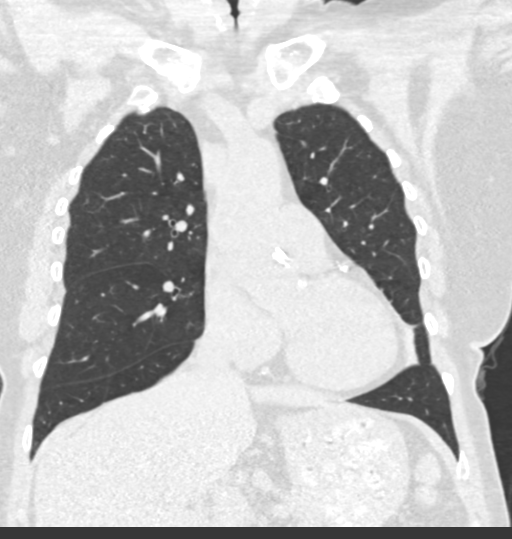
[im 76/127  lung]
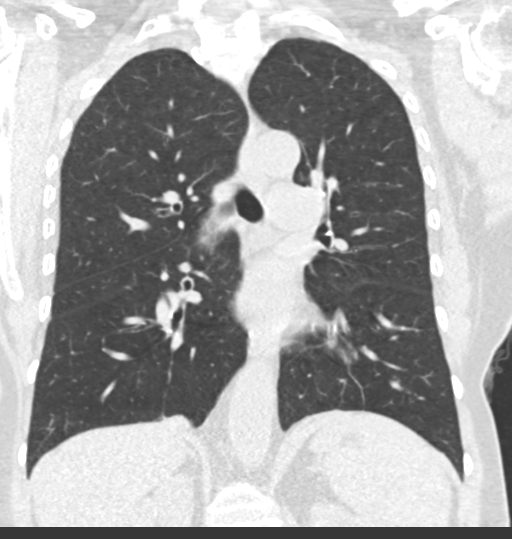

[15 of 36 positions shown; findings below may reference images not displayed]

FINDINGS: Cardiovascular: Heart size is normal. There is no significant
pericardial fluid, thickening or pericardial calcification. There is
aortic atherosclerosis, as well as atherosclerosis of the great
vessels of the mediastinum and the coronary arteries, including
calcified atherosclerotic plaque in the left main, left anterior
descending, left circumflex and right coronary arteries.

Mediastinum/Nodes: No pathologically enlarged mediastinal or hilar
lymph nodes. Esophagus is unremarkable in appearance. No axillary
lymphadenopathy.

Lungs/Pleura: No suspicious appearing pulmonary nodules or masses
are noted. No acute consolidative airspace disease. No pleural
effusions. Mild diffuse bronchial wall thickening with very mild
centrilobular and paraseptal emphysema. Small right-sided
Bochdalek's hernia.

Upper Abdomen: Aortic atherosclerosis.

Musculoskeletal: There are no aggressive appearing lytic or blastic
lesions noted in the visualized portions of the skeleton.
IMPRESSION: 1. Lung-RADS 1S, negative. Continue annual screening with low-dose
chest CT without contrast in 12 months.
2. The "S" modifier above refers to potentially clinically
significant non lung cancer related findings. Specifically, there is
aortic atherosclerosis, in addition to left main and three-vessel
coronary artery disease. Please note that although the presence of
coronary artery calcium documents the presence of coronary artery
disease, the severity of this disease and any potential stenosis
cannot be assessed on this non-gated CT examination. Assessment for
potential risk factor modification, dietary therapy or pharmacologic
therapy may be warranted, if clinically indicated.
3. Mild diffuse bronchial wall thickening with mild centrilobular
and paraseptal emphysema; imaging findings suggestive of underlying
COPD.

Aortic Atherosclerosis (1RH7R-OLT.T) and Emphysema (1RH7R-NV8.B).

## 2023-03-23 NOTE — Progress Notes (Signed)
Carelink Summary Report / Loop Recorder 

## 2023-03-30 ENCOUNTER — Ambulatory Visit (INDEPENDENT_AMBULATORY_CARE_PROVIDER_SITE_OTHER): Payer: Medicare PPO

## 2023-03-30 DIAGNOSIS — I480 Paroxysmal atrial fibrillation: Secondary | ICD-10-CM | POA: Diagnosis not present

## 2023-03-30 LAB — CUP PACEART REMOTE DEVICE CHECK
Date Time Interrogation Session: 20240623230520
Implantable Pulse Generator Implant Date: 20210208

## 2023-03-31 ENCOUNTER — Encounter: Payer: Self-pay | Admitting: Cardiology

## 2023-04-07 ENCOUNTER — Telehealth: Payer: Self-pay | Admitting: Cardiology

## 2023-04-07 NOTE — Telephone Encounter (Signed)
Patient is returning call in regards to results. Requesting return call. States you can leave message.

## 2023-04-07 NOTE — Telephone Encounter (Signed)
Baird Lyons, RN 04/06/2023  5:43 PM EDT Back to Top    Left message to call back     Will Jorja Loa, MD 03/31/2023  7:54 AM EDT     Abnormal ILR reviewed. Notable for rapid AF. Increase diltiazem to 360 mg.

## 2023-04-07 NOTE — Telephone Encounter (Signed)
Left detailed message per pt request. Aware I will send this via mychart as well and she can call back or respond via mychart.

## 2023-04-13 DIAGNOSIS — M25512 Pain in left shoulder: Secondary | ICD-10-CM | POA: Diagnosis not present

## 2023-04-13 DIAGNOSIS — M791 Myalgia, unspecified site: Secondary | ICD-10-CM | POA: Diagnosis not present

## 2023-04-17 NOTE — Progress Notes (Signed)
Carelink Summary Report / Loop Recorder 

## 2023-05-04 ENCOUNTER — Ambulatory Visit: Payer: Medicare PPO

## 2023-05-04 DIAGNOSIS — I480 Paroxysmal atrial fibrillation: Secondary | ICD-10-CM

## 2023-05-15 NOTE — Telephone Encounter (Signed)
Left detailed message as I am out of the office next week. Informed pt of Dr. Elberta Fortis response. Advised to call the office next week if needs refills, although looks like  she should have refills at pharmacy.

## 2023-05-20 NOTE — Progress Notes (Signed)
Carelink Summary Report / Loop Recorder 

## 2023-06-04 LAB — CUP PACEART REMOTE DEVICE CHECK
Date Time Interrogation Session: 20240828230649
Implantable Pulse Generator Implant Date: 20210208

## 2023-06-09 ENCOUNTER — Ambulatory Visit (INDEPENDENT_AMBULATORY_CARE_PROVIDER_SITE_OTHER): Payer: Medicare PPO

## 2023-06-09 DIAGNOSIS — I480 Paroxysmal atrial fibrillation: Secondary | ICD-10-CM | POA: Diagnosis not present

## 2023-06-11 DIAGNOSIS — E7800 Pure hypercholesterolemia, unspecified: Secondary | ICD-10-CM | POA: Diagnosis not present

## 2023-06-11 DIAGNOSIS — M810 Age-related osteoporosis without current pathological fracture: Secondary | ICD-10-CM | POA: Diagnosis not present

## 2023-06-11 DIAGNOSIS — R7303 Prediabetes: Secondary | ICD-10-CM | POA: Diagnosis not present

## 2023-06-16 DIAGNOSIS — F339 Major depressive disorder, recurrent, unspecified: Secondary | ICD-10-CM | POA: Diagnosis not present

## 2023-06-16 DIAGNOSIS — Z23 Encounter for immunization: Secondary | ICD-10-CM | POA: Diagnosis not present

## 2023-06-16 DIAGNOSIS — J302 Other seasonal allergic rhinitis: Secondary | ICD-10-CM | POA: Diagnosis not present

## 2023-06-16 DIAGNOSIS — I480 Paroxysmal atrial fibrillation: Secondary | ICD-10-CM | POA: Diagnosis not present

## 2023-06-16 DIAGNOSIS — I700 Atherosclerosis of aorta: Secondary | ICD-10-CM | POA: Diagnosis not present

## 2023-06-16 DIAGNOSIS — K219 Gastro-esophageal reflux disease without esophagitis: Secondary | ICD-10-CM | POA: Diagnosis not present

## 2023-06-16 DIAGNOSIS — J432 Centrilobular emphysema: Secondary | ICD-10-CM | POA: Diagnosis not present

## 2023-06-16 DIAGNOSIS — E213 Hyperparathyroidism, unspecified: Secondary | ICD-10-CM | POA: Diagnosis not present

## 2023-06-16 DIAGNOSIS — Z0000 Encounter for general adult medical examination without abnormal findings: Secondary | ICD-10-CM | POA: Diagnosis not present

## 2023-06-17 DIAGNOSIS — Z1151 Encounter for screening for human papillomavirus (HPV): Secondary | ICD-10-CM | POA: Diagnosis not present

## 2023-06-17 DIAGNOSIS — Z124 Encounter for screening for malignant neoplasm of cervix: Secondary | ICD-10-CM | POA: Diagnosis not present

## 2023-06-17 DIAGNOSIS — Z6827 Body mass index (BMI) 27.0-27.9, adult: Secondary | ICD-10-CM | POA: Diagnosis not present

## 2023-06-17 DIAGNOSIS — N644 Mastodynia: Secondary | ICD-10-CM | POA: Diagnosis not present

## 2023-06-17 NOTE — Progress Notes (Signed)
Carelink Summary Report / Loop Recorder 

## 2023-06-18 ENCOUNTER — Encounter: Payer: Self-pay | Admitting: Obstetrics and Gynecology

## 2023-06-18 ENCOUNTER — Other Ambulatory Visit: Payer: Self-pay | Admitting: Obstetrics and Gynecology

## 2023-06-18 DIAGNOSIS — N644 Mastodynia: Secondary | ICD-10-CM

## 2023-06-18 DIAGNOSIS — Z1231 Encounter for screening mammogram for malignant neoplasm of breast: Secondary | ICD-10-CM

## 2023-06-23 DIAGNOSIS — H1045 Other chronic allergic conjunctivitis: Secondary | ICD-10-CM | POA: Diagnosis not present

## 2023-06-23 DIAGNOSIS — H04123 Dry eye syndrome of bilateral lacrimal glands: Secondary | ICD-10-CM | POA: Diagnosis not present

## 2023-06-23 DIAGNOSIS — H11123 Conjunctival concretions, bilateral: Secondary | ICD-10-CM | POA: Diagnosis not present

## 2023-06-29 ENCOUNTER — Encounter: Payer: Self-pay | Admitting: Cardiology

## 2023-06-29 ENCOUNTER — Ambulatory Visit: Payer: Medicare PPO | Attending: Cardiology | Admitting: Cardiology

## 2023-06-29 VITALS — BP 120/78 | HR 59 | Ht 63.0 in | Wt 145.6 lb

## 2023-06-29 DIAGNOSIS — G4733 Obstructive sleep apnea (adult) (pediatric): Secondary | ICD-10-CM | POA: Diagnosis not present

## 2023-06-29 DIAGNOSIS — I493 Ventricular premature depolarization: Secondary | ICD-10-CM | POA: Diagnosis not present

## 2023-06-29 DIAGNOSIS — I480 Paroxysmal atrial fibrillation: Secondary | ICD-10-CM | POA: Diagnosis not present

## 2023-06-29 DIAGNOSIS — D6869 Other thrombophilia: Secondary | ICD-10-CM | POA: Diagnosis not present

## 2023-07-08 LAB — CUP PACEART REMOTE DEVICE CHECK
Date Time Interrogation Session: 20240930231605
Implantable Pulse Generator Implant Date: 20210208

## 2023-07-13 ENCOUNTER — Ambulatory Visit (INDEPENDENT_AMBULATORY_CARE_PROVIDER_SITE_OTHER): Payer: Medicare PPO

## 2023-07-13 DIAGNOSIS — I480 Paroxysmal atrial fibrillation: Secondary | ICD-10-CM | POA: Diagnosis not present

## 2023-07-13 DIAGNOSIS — M7918 Myalgia, other site: Secondary | ICD-10-CM | POA: Diagnosis not present

## 2023-07-13 DIAGNOSIS — M25512 Pain in left shoulder: Secondary | ICD-10-CM | POA: Diagnosis not present

## 2023-07-13 DIAGNOSIS — M791 Myalgia, unspecified site: Secondary | ICD-10-CM | POA: Diagnosis not present

## 2023-07-27 NOTE — Progress Notes (Signed)
Carelink Summary Report / Loop Recorder 

## 2023-07-29 ENCOUNTER — Encounter: Payer: Self-pay | Admitting: Cardiology

## 2023-07-29 NOTE — Telephone Encounter (Signed)
Pt notified that her chart has been updated. She appreciates it

## 2023-08-12 LAB — CUP PACEART REMOTE DEVICE CHECK
Date Time Interrogation Session: 20241102230144
Implantable Pulse Generator Implant Date: 20210208

## 2023-08-17 ENCOUNTER — Ambulatory Visit: Payer: Medicare PPO

## 2023-08-17 DIAGNOSIS — I480 Paroxysmal atrial fibrillation: Secondary | ICD-10-CM | POA: Diagnosis not present

## 2023-08-24 ENCOUNTER — Other Ambulatory Visit: Payer: Medicare PPO

## 2023-08-25 ENCOUNTER — Ambulatory Visit
Admission: RE | Admit: 2023-08-25 | Discharge: 2023-08-25 | Disposition: A | Discharge status: Home / Self Care | Payer: Medicare PPO | Source: Ambulatory Visit | Attending: Obstetrics and Gynecology | Admitting: Obstetrics and Gynecology

## 2023-08-25 DIAGNOSIS — N644 Mastodynia: Secondary | ICD-10-CM

## 2023-09-10 NOTE — Progress Notes (Signed)
Carelink Summary Report / Loop Recorder 

## 2023-09-11 ENCOUNTER — Ambulatory Visit (INDEPENDENT_AMBULATORY_CARE_PROVIDER_SITE_OTHER): Payer: Medicare PPO

## 2023-09-11 DIAGNOSIS — I480 Paroxysmal atrial fibrillation: Secondary | ICD-10-CM

## 2023-09-11 LAB — CUP PACEART REMOTE DEVICE CHECK
Date Time Interrogation Session: 20241205230253
Implantable Pulse Generator Implant Date: 20210208

## 2023-09-21 ENCOUNTER — Ambulatory Visit: Payer: Medicare PPO

## 2023-09-23 DIAGNOSIS — E1159 Type 2 diabetes mellitus with other circulatory complications: Secondary | ICD-10-CM | POA: Diagnosis not present

## 2023-09-23 DIAGNOSIS — K219 Gastro-esophageal reflux disease without esophagitis: Secondary | ICD-10-CM | POA: Diagnosis not present

## 2023-09-23 DIAGNOSIS — I480 Paroxysmal atrial fibrillation: Secondary | ICD-10-CM | POA: Diagnosis not present

## 2023-09-23 DIAGNOSIS — E7800 Pure hypercholesterolemia, unspecified: Secondary | ICD-10-CM | POA: Diagnosis not present

## 2023-10-16 ENCOUNTER — Ambulatory Visit: Payer: Medicare PPO

## 2023-10-16 DIAGNOSIS — L821 Other seborrheic keratosis: Secondary | ICD-10-CM | POA: Diagnosis not present

## 2023-10-16 DIAGNOSIS — I480 Paroxysmal atrial fibrillation: Secondary | ICD-10-CM | POA: Diagnosis not present

## 2023-10-16 DIAGNOSIS — L603 Nail dystrophy: Secondary | ICD-10-CM | POA: Diagnosis not present

## 2023-10-16 DIAGNOSIS — R208 Other disturbances of skin sensation: Secondary | ICD-10-CM | POA: Diagnosis not present

## 2023-10-16 DIAGNOSIS — L570 Actinic keratosis: Secondary | ICD-10-CM | POA: Diagnosis not present

## 2023-10-16 DIAGNOSIS — L820 Inflamed seborrheic keratosis: Secondary | ICD-10-CM | POA: Diagnosis not present

## 2023-10-16 DIAGNOSIS — D692 Other nonthrombocytopenic purpura: Secondary | ICD-10-CM | POA: Diagnosis not present

## 2023-10-16 DIAGNOSIS — D2271 Melanocytic nevi of right lower limb, including hip: Secondary | ICD-10-CM | POA: Diagnosis not present

## 2023-10-16 LAB — CUP PACEART REMOTE DEVICE CHECK
Date Time Interrogation Session: 20250109230232
Implantable Pulse Generator Implant Date: 20210208

## 2023-10-19 ENCOUNTER — Ambulatory Visit (HOSPITAL_COMMUNITY)
Admission: RE | Admit: 2023-10-19 | Discharge: 2023-10-19 | Disposition: A | Discharge status: Home / Self Care | Payer: Medicare PPO | Source: Ambulatory Visit | Attending: Internal Medicine | Admitting: Internal Medicine

## 2023-10-19 DIAGNOSIS — Z122 Encounter for screening for malignant neoplasm of respiratory organs: Secondary | ICD-10-CM | POA: Insufficient documentation

## 2023-10-19 DIAGNOSIS — Z87891 Personal history of nicotine dependence: Secondary | ICD-10-CM | POA: Insufficient documentation

## 2023-10-26 ENCOUNTER — Other Ambulatory Visit: Payer: Self-pay | Admitting: Acute Care

## 2023-10-26 ENCOUNTER — Ambulatory Visit: Payer: Medicare PPO

## 2023-10-26 DIAGNOSIS — Z87891 Personal history of nicotine dependence: Secondary | ICD-10-CM

## 2023-10-26 DIAGNOSIS — Z122 Encounter for screening for malignant neoplasm of respiratory organs: Secondary | ICD-10-CM

## 2023-11-02 DIAGNOSIS — M25512 Pain in left shoulder: Secondary | ICD-10-CM | POA: Diagnosis not present

## 2023-11-02 DIAGNOSIS — M25561 Pain in right knee: Secondary | ICD-10-CM | POA: Diagnosis not present

## 2023-11-13 ENCOUNTER — Telehealth: Payer: Self-pay | Admitting: Cardiology

## 2023-11-13 MED ORDER — DILTIAZEM HCL ER COATED BEADS 240 MG PO CP24: 240.0000 mg | ORAL_CAPSULE | Freq: Every day | ORAL | 2 refills | Status: DC

## 2023-11-13 NOTE — Telephone Encounter (Signed)
 Pt's medication was sent to pt's pharmacy as requested. Confirmation received.

## 2023-11-13 NOTE — Telephone Encounter (Signed)
*  STAT* If patient is at the pharmacy, call can be transferred to refill team.   1. Which medications need to be refilled? (please list name of each medication and dose if known)   diltiazem  (CARDIZEM  CD) 240 MG 24 hr capsule   2. Would you like to learn more about the convenience, safety, & potential cost savings by using the [facility]?   3. Are you open to using the [facility] (Type [facility]. ).  4. Which pharmacy/location (including street and city if local pharmacy) is medication to be sent to?  [id number] [place], [place] - [address]   5. Do they need a 30 day or 90 day supply?   90 day  Patient stated she is completely out of this medication.

## 2023-11-20 ENCOUNTER — Ambulatory Visit (INDEPENDENT_AMBULATORY_CARE_PROVIDER_SITE_OTHER): Payer: Medicare PPO

## 2023-11-20 DIAGNOSIS — I480 Paroxysmal atrial fibrillation: Secondary | ICD-10-CM

## 2023-11-20 LAB — CUP PACEART REMOTE DEVICE CHECK
Date Time Interrogation Session: 20250213230340
Implantable Pulse Generator Implant Date: 20210208

## 2023-11-20 NOTE — Progress Notes (Signed)
Carelink Summary Report / Loop Recorder

## 2023-12-08 ENCOUNTER — Ambulatory Visit (HOSPITAL_COMMUNITY)
Admission: RE | Admit: 2023-12-08 | Discharge: 2023-12-08 | Disposition: A | Discharge status: Home / Self Care | Payer: Medicare PPO | Source: Ambulatory Visit | Attending: Internal Medicine | Admitting: Internal Medicine

## 2023-12-08 VITALS — BP 122/54 | HR 73 | Ht 63.0 in | Wt 145.2 lb

## 2023-12-08 DIAGNOSIS — I480 Paroxysmal atrial fibrillation: Secondary | ICD-10-CM | POA: Diagnosis not present

## 2023-12-08 DIAGNOSIS — Z7901 Long term (current) use of anticoagulants: Secondary | ICD-10-CM | POA: Insufficient documentation

## 2023-12-08 DIAGNOSIS — R002 Palpitations: Secondary | ICD-10-CM | POA: Insufficient documentation

## 2023-12-08 DIAGNOSIS — Z79899 Other long term (current) drug therapy: Secondary | ICD-10-CM | POA: Diagnosis not present

## 2023-12-08 DIAGNOSIS — D6869 Other thrombophilia: Secondary | ICD-10-CM | POA: Diagnosis not present

## 2023-12-08 MED ORDER — DILTIAZEM HCL ER COATED BEADS 360 MG PO CP24: 360.0000 mg | ORAL_CAPSULE | Freq: Every day | ORAL | 6 refills | Status: DC

## 2023-12-08 NOTE — Patient Instructions (Addendum)
Increase cardizem to 360mg once a day 

## 2023-12-08 NOTE — Progress Notes (Signed)
 Primary Care Physician: Merri Brunette, MD Referring Physician:Device clinic EP: Dr. Fleet Contras Shakala Marlatt is a 73 y.o. 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 She is in SR today. 30 mg Cardizem helps to blunt the episodes.  Usually over in  2 hours.   She reports that she is using CPAP, no significant caffeine or alcohol. She did use Multaq in the past, stopped this drug as it made her hair fall out.  She has noted increase of afib over the last couple of weeks. She had been offered ablation  by Dr. Johney Frame in the past but at the time was not ready.   She has noted increase of exertional dyspnea with activities over the last several months. This is when she is in SR. Denies any chest discomfort. She has had 3 vessel CAD noted on her Chest Ct's since 2019. She is on xarelto 20 mg daily for a CHA2DS2VASc score of 3.   F/u in afib clinic, 10/18/20. She saw Dr. Johney Frame in October 2021,  and he discussed options for her afib including ablation, but she feels that her afib burden is still low and wants to wait By last paceart report, burden is around 5%.  F/u in Afib clinic, 12/08/23. She is currently in NSR. ILR review by Dr. Elberta Fortis on 2/16 noted increased Afib burden 7.1% with ventricular rates > 110 bpm ~25% of the time. She is on diltiazem 240 mg daily. No missed doses of Xarelto 20 mg daily.    Today, she denies symptoms of palpitations, chest pain, shortness of breath, orthopnea, PND, lower extremity edema, dizziness, presyncope, syncope, or neurologic sequela. The patient is tolerating medications without difficulties and is otherwise without complaint today.   Past Medical History:  Diagnosis Date   Allergies    Anxiety    Asthma    COPD (chronic obstructive pulmonary disease) (HCC)    Coronary artery calcification seen on CT  scan    Depression    Fatigue    Former tobacco use    Hyperlipidemia    Paroxysmal atrial fibrillation (HCC)    Pre-diabetes    RBBB (right bundle branch block with left anterior fascicular block)    Scoliosis    Serum calcium elevated    Thyroid disease    "questionable" per patient   Past Surgical History:  Procedure Laterality Date   APPENDECTOMY     BREAST EXCISIONAL BIOPSY Right 2017   benign   implantable loop recorder placement  11/14/2019   Medtronic Reveal Linq model LNQ 2 (SN [id number]) implantable loop recorder    LAPROSCOPIC     X 2 IN EARLY 30-40'S   OVARIAN CYST REMOVAL     RADIOACTIVE SEED GUIDED EXCISIONAL BREAST BIOPSY Right 05/15/2016   Procedure: RIGHT RADIOACTIVE SEED GUIDED EXCISIONAL BREAST BIOPSY;  Surgeon: Emelia Loron, MD;  Location: [facility];  Service: General;  Laterality: Right;  RIGHT RADIOACTIVE SEED GUIDED EXCISIONAL BREAST BIOPSY   TONSILECTOMY, ADENOIDECTOMY, BILATERAL MYRINGOTOMY AND TUBES      Current Outpatient Medications  Medication Sig Dispense Refill   albuterol (PROVENTIL HFA;VENTOLIN HFA) 108 (90 Base) MCG/ACT inhaler Use 2 inhalations 15 minutes apart every 4 hours to rescue Asthma 3 Inhaler 3   buPROPion (WELLBUTRIN XL) 300 MG 24 hr tablet Take 1 tablet every Morning  or Mood, Focus & Concentration 90 tablet 1   cholecalciferol (VITAMIN D3) 25 MCG (1000 UNIT) tablet Take 1,000 Units by mouth 2 (two) times a week.     citalopram (CELEXA) 20 MG tablet Take 20 mg by mouth daily.     cyclobenzaprine (FLEXERIL) 10 MG tablet Take 10 mg by mouth at bedtime as needed.      diltiazem (CARDIZEM) 30 MG tablet Take 1 tablet Daily for  rapid Heart Beat (Patient taking differently: Take 30 mg by mouth as needed (for HR over 130). Take 1 tablet Daily for  rapid Heart Beat) 90 tablet 1   fexofenadine (ALLEGRA ALLERGY) 180 MG tablet Take 180 mg by mouth daily.     fluorometholone (FML) 0.1 % ophthalmic suspension Place 1 drop into  both eyes as needed. As needed     fluticasone (FLONASE) 50 MCG/ACT nasal spray Place 1 spray into both nostrils 2 (two) times daily.     ipratropium (ATROVENT) 0.06 % nasal spray SMARTSIG:2 Spray(s) Both Nares 1-3 Times Daily PRN     Multiple Vitamin (MULTI VITAMIN DAILY PO) Take 1 tablet by mouth every morning.     omeprazole (PRILOSEC) 40 MG capsule TAKE 1 CAPSULE BY MOUTH ONCE DAILY 30-60 MINUTES BEFORE MORNING MEAL FOR 8 WEEKS     OVER THE COUNTER MEDICATION Eye drop- 1 drop each eye as needed. Equate dry eye     rosuvastatin (CRESTOR) 5 MG tablet Take 5 mg by mouth daily.     Semaglutide,0.25 or 0.5MG /DOS, 2 MG/1.5ML SOPN Inject 0.375 mg into the skin once a week.     valACYclovir (VALTREX) 500 MG tablet Take 1 tablet Daily for Prevention Fever Blisters 90 tablet 3   XARELTO 20 MG TABS tablet TAKE 1 TABLET DAILY TO PREVENT BLOOD CLOTS 90 tablet 0   diltiazem (CARDIZEM CD) 360 MG 24 hr capsule Take 1 capsule (360 mg total) by mouth daily. 30 capsule 6   No current facility-administered medications for this encounter.    Allergies  Allergen Reactions   Atorvastatin Other (See Comments)    Myalgias    Tetanus Toxoids Itching   Tetanus-Diphtheria Toxoids Td Other (See Comments)   ROS- All systems are reviewed and negative except as per the HPI above  Physical Exam: Vitals:   12/08/23 1354  BP: (!) 122/54  Pulse: 73  Weight: 65.9 kg  Height: 5\' 3"  (1.6 m)    Wt Readings from Last 3 Encounters:  12/08/23 65.9 kg  06/29/23 66 kg  05/23/22 61.2 kg    Labs: Lab Results  Component Value Date   NA 137 08/25/2018   K 4.9 08/25/2018   CL 103 08/25/2018   CO2 24 08/25/2018   GLUCOSE 107 (H) 08/25/2018   BUN 19 08/25/2018   CREATININE 0.83 08/25/2018   CALCIUM 11.0 (H) 08/25/2018   PHOS 2.9 08/26/2018   MG 2.1 08/26/2018   No results found for: "INR" Lab Results  Component Value Date   CHOL 227 (H) 05/27/2018   HDL 60 05/27/2018   LDLCALC 137 (H) 05/27/2018   TRIG  160 (H) 05/27/2018   GEN- The patient is well appearing, alert and oriented x 3 today.   Neck - no JVD or carotid bruit noted Lungs- Clear to ausculation bilaterally, normal work of breathing Heart- Regular rate and rhythm, no murmurs, rubs or gallops, PMI not laterally displaced Extremities- no clubbing, cyanosis, or edema Skin - no rash or ecchymosis noted  EKG- Vent. rate 73 BPM PR  interval 118 ms QRS duration 142 ms QT/QTcB 418/460 ms P-R-T axes 57 -71 13 Sinus rhythm with Premature atrial complexes Left axis deviation Right bundle branch block Abnormal ECG When compared with ECG of 29-Jun-2023 14:45, PREVIOUS ECG IS PRESENT  ECHO 07/26/20:  1. Left ventricular ejection fraction, by estimation, is 55 to 60%. The  left ventricle has normal function. The left ventricle has no regional  wall motion abnormalities. Left ventricular diastolic parameters are  consistent with Grade I diastolic  dysfunction (impaired relaxation).   2. Right ventricular systolic function is normal. The right ventricular  size is normal. There is normal pulmonary artery systolic pressure. The  estimated right ventricular systolic pressure is 17.9 mmHg.   3. Left atrial size was mildly dilated.   4. The mitral valve is normal in structure. Mild mitral valve  regurgitation. No evidence of mitral stenosis.   5. The aortic valve is tricuspid. Aortic valve regurgitation is not  visualized. Mild aortic valve stenosis. Aortic valve area, by VTI measures  1.74 cm. Aortic valve mean gradient measures 11.0 mmHg.   6. The inferior vena cava is normal in size with greater than 50%  respiratory variability, suggesting right atrial pressure of 3 mmHg.   Assessment and Plan: 1. Paroxsymal afib History of Multaq discontinued due to hair loss.  She is currently in NSR. We discussed her ILR data reviews from the past year. She overall notices palpitations more and would like to treat this. We will increase her  rate control and transition to diltiazem 360 mg daily. She will monitor her burden and rate control with subsequent ILR review. She is hesitant to proceed with ablation but knows if this gets worse she will likely pursue with Dr. Elberta Fortis.   2. CHA2DS2VASc score of 3 Continue Xarelto 20 mg daily.   Follow up Dr. Elberta Fortis in September for annual follow up.   Justin Mend, PA-C [facility] [address] [place], [place] [address] [phone]

## 2023-12-16 DIAGNOSIS — H04123 Dry eye syndrome of bilateral lacrimal glands: Secondary | ICD-10-CM | POA: Diagnosis not present

## 2023-12-16 DIAGNOSIS — H1045 Other chronic allergic conjunctivitis: Secondary | ICD-10-CM | POA: Diagnosis not present

## 2023-12-16 DIAGNOSIS — E119 Type 2 diabetes mellitus without complications: Secondary | ICD-10-CM | POA: Diagnosis not present

## 2023-12-16 DIAGNOSIS — H25811 Combined forms of age-related cataract, right eye: Secondary | ICD-10-CM | POA: Diagnosis not present

## 2023-12-16 DIAGNOSIS — H26492 Other secondary cataract, left eye: Secondary | ICD-10-CM | POA: Diagnosis not present

## 2023-12-16 DIAGNOSIS — H40013 Open angle with borderline findings, low risk, bilateral: Secondary | ICD-10-CM | POA: Diagnosis not present

## 2023-12-16 DIAGNOSIS — H53021 Refractive amblyopia, right eye: Secondary | ICD-10-CM | POA: Diagnosis not present

## 2023-12-21 NOTE — Progress Notes (Signed)
 [facility] Summary Report / Loop Recorder

## 2023-12-25 ENCOUNTER — Ambulatory Visit: Payer: Medicare PPO

## 2023-12-25 DIAGNOSIS — I480 Paroxysmal atrial fibrillation: Secondary | ICD-10-CM

## 2023-12-25 LAB — CUP PACEART REMOTE DEVICE CHECK
Date Time Interrogation Session: 20250321053920
Implantable Pulse Generator Implant Date: 20210208

## 2023-12-31 ENCOUNTER — Telehealth: Payer: Self-pay | Admitting: Internal Medicine

## 2023-12-31 ENCOUNTER — Ambulatory Visit: Payer: Medicare PPO | Admitting: Internal Medicine

## 2023-12-31 ENCOUNTER — Encounter: Payer: Self-pay | Admitting: Internal Medicine

## 2023-12-31 VITALS — BP 130/80 | HR 67 | Temp 98.3°F | Ht 63.0 in | Wt 146.0 lb

## 2023-12-31 DIAGNOSIS — J387 Other diseases of larynx: Secondary | ICD-10-CM | POA: Diagnosis not present

## 2023-12-31 DIAGNOSIS — R0989 Other specified symptoms and signs involving the circulatory and respiratory systems: Secondary | ICD-10-CM | POA: Diagnosis not present

## 2023-12-31 DIAGNOSIS — R053 Chronic cough: Secondary | ICD-10-CM

## 2023-12-31 DIAGNOSIS — R0982 Postnasal drip: Secondary | ICD-10-CM | POA: Diagnosis not present

## 2023-12-31 DIAGNOSIS — Z87891 Personal history of nicotine dependence: Secondary | ICD-10-CM

## 2023-12-31 DIAGNOSIS — J439 Emphysema, unspecified: Secondary | ICD-10-CM | POA: Diagnosis not present

## 2023-12-31 LAB — POCT EXHALED NITRIC OXIDE: FeNO level (ppb): 10

## 2023-12-31 NOTE — Patient Instructions (Addendum)
 ICD-10-CM   1. Stopped smoking with greater than 40 pack year history  Z87.891 Alpha-1 antitrypsin phenotype    Pulmonary function test    2. Chronic cough  R05.3 POCT EXHALED NITRIC OXIDE    Alpha-1 antitrypsin phenotype    Pulmonary function test    3. Pulmonary emphysema, unspecified emphysema type (HCC)  J43.9 Alpha-1 antitrypsin phenotype    Pulmonary function test    4. 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 Chronic sinus complaints  R09.89     6. Post-nasal drip  R09.82      Good to see you again after more than 3 years.  Your chronic cough continues.  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    Risk factors for this in  you are: Possible hiatal hernia, the mild emphysema, talking a lot, sinus drainage, previous history of perennial environmental allergies  PLAN - Take 2 days of voice rest and do not talk a whisper - Every time you have an urge to cough or clear throat drink water or suck on a sugarless lozenge -Gabapentin indicated but I am going to first check with pharmacy team -Refer ENT  Dr Irene Pap = Get CT scan of the sinus without contrast -Try samples of Stiolto  -Get blood work for alpha-1 antitrypsin phenotype and blood allergy profile  Follow-up - Return to see nurse practitioner in 6/8 weeks but after completing all of the above   = RSI cough score at follow-up

## 2023-12-31 NOTE — Progress Notes (Signed)
 OV Feb 2018  Synopsis: First evaluated in December 2017 by [facility] pulmonary after heavy smoking history through 2015. She has a history of childhood asthma and has an adult took immunotherapy for 20 years.   HPI Chief Complaint  Patient presents with   Follow-up    pt c/o pnd, prod cough.    Fantasy says that her symptoms have been worsening lately. She had flu in January. Overall she's not improved.  Cough: > form a post nasal drip all the time > worse when she had influenza A in January  Influenza A in January > diagnosed in January > three rounds of antibiotics > guaifenesin helped  Asthma: > added [place], didn't help with her chest congestion and cough > she says that she can feel mucus in her chest, she just can't get it out  OV 09/04/2020  Subjective:  Patient ID: Kathryn Booker, female , DOB: 07-13-1951 , age 73 y.o. , MRN: [id number] , ADDRESS: [address] [place] [address] PCP Merri Brunette, MD Patient Care Team: Merri Brunette, MD as PCP - General (Internal Medicine) Quintella Reichert, MD as PCP - Sleep Medicine (Cardiology) Wendall Stade, MD as Consulting Physician (Cardiology) Louis Meckel, MD (Inactive) as Consulting Physician (Gastroenterology) Janalyn Harder, MD as Consulting Physician (Dermatology) Janet Berlin, MD as Consulting Physician (Ophthalmology) Emelia Loron, MD as Consulting Physician (General Surgery) Hillis Range, MD as Consulting Physician (Cardiology)  This Provider for this visit: Treatment Team:  Attending Provider: Kalman Shan, MD    09/04/2020 -   Chief Complaint  Patient presents with   New Patient (Initial Visit)    Low dose CT screening follow up     HPI Kathryn Booker 73 y.o. -female former patient of Dr. Heber Lajas.  Former 70 pack smoker last seen by Dr. Kendrick Fries over 3 years ago.  Therefore this is a new visit.  She has a history of sleep apnea for which she uses CPAP.  She  also has a history of allergic asthma in the past.  She remembers seeing Dr. Stevphen Rochester and apparently her skin test was extremely positive.  She feels that still some asthma but essentially this is not a major issue at this point.  In the last few to several years has been dealing with chronic sinus drainage and postnasal drip and a sensation of blocked nose from deviated nasal septum.  She is frustrated by this.  2018 CT sinuses was clear.  This really bothers her.  In 2018 she had pulmonary function test documented below.  After that she was told that she was more like an asthma patient as opposed to a COPD patient according to history.  However most recently she has had insidious onset of shortness of breath that is progressively worse this past year.  Symptom scores are listed below.  She says in the context of this she had a routine low-dose CT scan of the chest because of a previous history of 70 pack smoking.  This showed new onset interstitial abnormalities particularly in the lung base.  Therefore she is worried and therefore she has been referred here to the ILD center.  She has significant amount of sinus drainage, shortness of breath as below and cough with constant clearing of the throat these are her main symptoms.  Her medication list is reviewed.     ROS - per HPI   CT chest 08/02/2020  CLINICAL DATA:  73 year old asymptomatic female former smoker with 71  pack-year smoking history, quit smoking 6 years prior.   EXAM: CT CHEST WITHOUT CONTRAST LOW-DOSE FOR LUNG CANCER SCREENING   TECHNIQUE: Multidetector CT imaging of the chest was performed following the standard protocol without IV contrast.   COMPARISON:  07/15/2019 screening chest CT.   FINDINGS: Cardiovascular: Normal heart size. No significant pericardial effusion/thickening. Three-vessel coronary atherosclerosis. Atherosclerotic nonaneurysmal thoracic aorta. Normal caliber pulmonary arteries.    Mediastinum/Nodes: No discrete thyroid nodules. Unremarkable esophagus. No pathologically enlarged axillary, mediastinal or hilar lymph nodes, noting limited sensitivity for the detection of hilar adenopathy on this noncontrast study.   Lungs/Pleura: No pneumothorax. No pleural effusion. Mild centrilobular emphysema with mild diffuse bronchial wall thickening. No acute consolidative airspace disease or lung masses. No significant pulmonary nodules. Nonspecific mild patchy subpleural reticulation and ground-glass opacity in the lower lungs bilaterally, slightly increased from prior.   Upper abdomen: Diffuse hepatic steatosis.   Musculoskeletal: No aggressive appearing focal osseous lesions. Marked thoracic spondylosis. Subcutaneous loop recorder in the medial ventral lower left chest wall.  IMPRESSION: 1. Lung-RADS 1, negative. Continue annual screening with low-dose chest CT without contrast in 12 months. 2. Nonspecific patchy subpleural reticulation and ground-glass opacity in the lower lungs bilaterally, slightly increased. If there is clinical concern for a developing interstitial lung disease, pulmonology consultation and high-resolution chest CT may be obtained for further evaluation. 3. Three-vessel coronary atherosclerosis. 4. Diffuse hepatic steatosis. 5. Aortic Atherosclerosis (ICD10-I70.0) and Emphysema (ICD10-J43.9).     Electronically Signed   By: Delbert Phenix M.D.   On: 08/02/2020 16  OV 11/06/2020  Subjective:  Patient ID: Kathryn Booker, female , DOB: 1951/02/23 , age 73 y.o. , MRN: [id number] , ADDRESS: [address] [place] [address] PCP Merri Brunette, MD Patient Care Team: Merri Brunette, MD as PCP - General (Internal Medicine) Quintella Reichert, MD as PCP - Sleep Medicine (Cardiology) Wendall Stade, MD as Consulting Physician (Cardiology) Louis Meckel, MD (Inactive) as Consulting Physician (Gastroenterology) Janalyn Harder, MD as  Consulting Physician (Dermatology) Janet Berlin, MD as Consulting Physician (Ophthalmology) Emelia Loron, MD as Consulting Physician (General Surgery) Hillis Range, MD as Consulting Physician (Cardiology)  This Provider for this visit: Treatment Team:  Attending Provider: Kalman Shan, MD    11/06/2020 -   Chief Complaint  Patient presents with   Follow-up    F/U after PFT and feno    Follow-up shortness of breath and chronic cough in setting of prior history of heavy smoking.  Previous diagnosis of obstructive lung disease asthma/COPD. Most recently low-dose CT scan raise concern for ILD  HPI Kathryn Booker 73 y.o. -she is here to follow-up of the test results.  She continues to have significant amount of shortness of breath with exertion relieved by rest as documented above but also chronic cough.  We discussed the chronic cough in more detail it appears to have significant features of irritable larynx syndrome such as clearing of the throat and a feeling that sputum is stuck inside.  Her RSI cough score is 22 raising the concern for cough neuropathy/irritable larynx syndrome significantly.  She has had multiple test investigations namely   -High-resolution CT chest: This is negative for ILD which is good news.  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  Exhaled nitric oxide test is  normal.  Pulmonary function test is also normal.  CT scan of the sinuses is normal.         OV 12/31/2023 Zenaida Niece after 3 years.  Per Medicare criteria is a new patient.  Subjective:  Patient ID: Kathryn Booker, female , DOB: 1950-12-02 , age 73 y.o. , MRN: [id number] , ADDRESS: [address] [place] [address] PCP Merri Brunette, MD Patient Care  Team: Merri Brunette, MD as PCP - General (Internal Medicine) Quintella Reichert, MD as PCP - Sleep Medicine (Cardiology) Regan Lemming, MD as PCP - Electrophysiology (Cardiology) Wendall Stade, MD as Consulting Physician (Cardiology) Louis Meckel, MD (Inactive) as Consulting Physician (Gastroenterology) Janalyn Harder, MD (Inactive) as Consulting Physician (Dermatology) Janet Berlin, MD as Consulting Physician (Ophthalmology) Emelia Loron, MD as Consulting Physician (General Surgery) Regan Lemming, MD as Consulting Physician (Clinical Cardiac Electrophysiology)  This Provider for this visit: Treatment Team:  Attending Provider: Kalman Shan, MD    12/31/2023 -   Chief Complaint  Patient presents with   New Patient (Initial Visit)     HPI Kathryn Booker 73 y.o. -chronic cough.  It has been more than 3 years since I last saw her.  Therefore is a new patient per CMS criteria.  She tells me that she continues to have a chronic cough and clearing of the throat.  Is there all the time.  Recent sinus infections have made it worse.  She says is not able to even talk without significant clearing of the throat.  She says she talks a lot and if she were to stay quiet her friends would like it.  Is gotten worse in the last 6 to 12 months.  Severe.  She is constantly clearing the throat.  She is worried about the tracheobronchomalacia that was diagnosed a few years ago on high-resolution CT chest but the latest low-dose CT scan does not reported.  There is reports of mild emphysema but she is not taking any inhalers for this.  There is no shortness of breath.  She is also complaining of significant sinus drainage.  Many years ago she saw Dr. Rae Lips.  I do not see a CT sinus report.  Other than in 2021 and was normal.  She also reports positive skin allergy testing when she was 73 years old and she took allergy shots for another 20 years.  She has not taken allergy shots in  some decades now.  She is open to getting blood allergy testing.  Exam nitric oxide today is normal at 10 ppb.  Dr Gretta Cool Reflux Symptom Index (> 13-15 suggestive of LPR cough) 0 -> 5  =  none ->severe problem 11/06/2020   Hoarseness of problem with voice 1  Clearing  Of Throat 4  Excess throat mucus or feeling of post nasal drip 5  Difficulty swallowing food, liquid or tablets 0  Cough after eating or lying down 2  Breathing difficulties or choking episodes 1  Troublesome or annoying cough 4  Sensation of something sticking in throat or lump in throat 4  Heartburn, chest pain, indigestion, or stomach acid coming up 1  TOTAL 22      Narrative & Impression  CLINICAL DATA:  73 year old female former smoker (quit in 2015) with 66 pack-year history of smoking. Lung cancer screening examination.   EXAM: CT CHEST WITHOUT CONTRAST LOW-DOSE FOR LUNG CANCER SCREENING   TECHNIQUE: Multidetector CT imaging of the chest was performed following the standard protocol without  IV contrast.   RADIATION DOSE REDUCTION: This exam was performed according to the departmental dose-optimization program which includes automated exposure control, adjustment of the mA and/or kV according to patient size and/or use of iterative reconstruction technique.   COMPARISON:  Low-dose lung cancer screening chest CT 10/17/2021.   FINDINGS: Cardiovascular: Heart size is normal. There is no significant pericardial fluid, thickening or pericardial calcification. There is aortic atherosclerosis, as well as atherosclerosis of the great vessels of the mediastinum and the coronary arteries, including calcified atherosclerotic plaque in the left main, left anterior descending, left circumflex and right coronary arteries. Very mild calcifications of the aortic valve and mitral annulus.   Mediastinum/Nodes: No pathologically enlarged mediastinal or hilar lymph nodes. Please note that accurate exclusion of hilar  adenopathy is limited on noncontrast CT scans. Esophagus is unremarkable in appearance. No axillary lymphadenopathy.   Lungs/Pleura: No suspicious appearing pulmonary nodules or masses are noted. No acute consolidative airspace disease. No pleural effusions. Mild diffuse bronchial wall thickening with very mild centrilobular and paraseptal emphysema. Small right-sided Bochdalek's hernia incidentally noted.   Upper Abdomen: Unremarkable.   Musculoskeletal: There are no aggressive appearing lytic or blastic lesions noted in the visualized portions of the skeleton.   IMPRESSION: 1. Lung-RADS 1S, negative. Continue annual screening with low-dose chest CT without contrast in 12 months. 2. The "S" modifier above refers to potentially clinically significant non lung cancer related findings. Specifically, there is aortic atherosclerosis, in addition to left main and three-vessel coronary artery disease. Please note that although the presence of coronary artery calcium documents the presence of coronary artery disease, the severity of this disease and any potential stenosis cannot be assessed on this non-gated CT examination. Assessment for potential risk factor modification, dietary therapy or pharmacologic therapy may be warranted, if clinically indicated. 3. Mild diffuse bronchial wall thickening with very mild centrilobular and paraseptal emphysema; imaging findings suggestive of underlying COPD.   Aortic Atherosclerosis (ICD10-I70.0) and Emphysema (ICD10-J43.9).     Electronically Signed   By: Trudie Reed M.D.   On: 10/18/2022 11:44         Latest Reference Range & Units 10/24/13 10:22 01/26/14 11:29 05/18/14 09:43 05/02/15 10:08 08/17/15 09:29 11/30/15 09:40 05/02/16 10:52 11/10/16 10:53 12/03/16 17:20 02/17/17 09:27 05/06/17 09:49 11/06/17 09:20 05/27/18 12:01 09/04/20 15:01  Eosinophils Absolute 0.0 - 0.7 K/uL 0.3 0.2 0.5 0.2 0.1 0.3 116 232 0.2 165 122 173 120 0.2     PFT     Latest Ref Rng & Units 11/06/2020    9:58 AM 09/16/2016    3:57 PM  PFT Results  FVC-Pre L 2.39  2.53   FVC-Predicted Pre % 81  82   FVC-Post L 2.45  2.74   FVC-Predicted Post % 83  89   Pre FEV1/FVC % % 72  72   Post FEV1/FCV % % 71  74   FEV1-Pre L 1.73  1.82   FEV1-Predicted Pre % 77  77   FEV1-Post L 1.75  2.02   DLCO uncorrected ml/min/mmHg 18.19  18.86   DLCO UNC% % 95  82   DLCO corrected ml/min/mmHg 18.19    DLCO COR %Predicted % 95    DLVA Predicted % 102  93   TLC L 4.94  8.18   TLC % Predicted % 100  166   RV % Predicted % 111  276        LAB RESULTS last 96 hours No results found.  has a past medical history of Allergies, Anxiety, Asthma, COPD (chronic obstructive pulmonary disease) (HCC), Coronary artery calcification seen on CT scan, Depression, Fatigue, Former tobacco use, Hyperlipidemia, Paroxysmal atrial fibrillation (HCC), Pre-diabetes, RBBB (right bundle branch block with left anterior fascicular block), Scoliosis, Serum calcium elevated, and Thyroid disease.   reports that she quit smoking about 10 years ago. Her smoking use included cigarettes. She started smoking about 45 years ago. She has a 70 pack-year smoking history. She has never used smokeless tobacco.  Past Surgical History:  Procedure Laterality Date   APPENDECTOMY     BREAST EXCISIONAL BIOPSY Right 2017   benign   implantable loop recorder placement  11/14/2019   Medtronic Reveal Linq model LNQ 2 (SN [id number]) implantable loop recorder    LAPROSCOPIC     X 2 IN EARLY 30-40'S   OVARIAN CYST REMOVAL     RADIOACTIVE SEED GUIDED EXCISIONAL BREAST BIOPSY Right 05/15/2016   Procedure: RIGHT RADIOACTIVE SEED GUIDED EXCISIONAL BREAST BIOPSY;  Surgeon: Emelia Loron, MD;  Location: [facility];  Service: General;  Laterality: Right;  RIGHT RADIOACTIVE SEED GUIDED EXCISIONAL BREAST BIOPSY   TONSILECTOMY, ADENOIDECTOMY, BILATERAL MYRINGOTOMY AND TUBES       Allergies  Allergen Reactions   Atorvastatin Other (See Comments)    Myalgias    Tetanus Toxoids Itching   Tetanus-Diphtheria Toxoids Td Other (See Comments)    Immunization History  Administered Date(s) Administered   Fluad Quad(high Dose 65+) 07/18/2019   Influenza, High Dose Seasonal PF 08/10/2017   Influenza,inj,Quad PF,6+ Mos 07/06/2018   Influenza-Unspecified 08/10/2017, 07/06/2018, 07/18/2019, 08/07/2020   PFIZER(Purple Top)SARS-COV-2 Vaccination 11/27/2019, 12/21/2019, 08/10/2020, 02/20/2021   PPD Test 01/26/2014   Pneumococcal Conjugate-13 05/06/2017   Pneumococcal Polysaccharide-23 08/19/2018   Pneumococcal-Unspecified 12/25/2011   Td 07/22/2006   Tdap 05/02/2016   Zoster Recombinant(Shingrix) 02/23/2019, 05/25/2019   Zoster, Live 01/26/2014    Family History  Problem Relation Age of Onset   Heart failure Mother    Hypertension Mother    Hyperlipidemia Mother    Heart disease Mother    Liver disease Father    Alcohol abuse Father    Colon cancer Neg Hx    Esophageal cancer Neg Hx    Stomach cancer Neg Hx      Current Outpatient Medications:    albuterol (PROVENTIL HFA;VENTOLIN HFA) 108 (90 Base) MCG/ACT inhaler, Use 2 inhalations 15 minutes apart every 4 hours to rescue Asthma, Disp: 3 Inhaler, Rfl: 3   buPROPion (WELLBUTRIN XL) 300 MG 24 hr tablet, Take 1 tablet every Morning or Mood, Focus & Concentration, Disp: 90 tablet, Rfl: 1   cholecalciferol (VITAMIN D3) 25 MCG (1000 UNIT) tablet, Take 1,000 Units by mouth 2 (two) times a week., Disp: , Rfl:    citalopram (CELEXA) 20 MG tablet, Take 20 mg by mouth daily., Disp: , Rfl:    cyclobenzaprine (FLEXERIL) 10 MG tablet, Take 10 mg by mouth at bedtime as needed. , Disp: , Rfl:    diltiazem (CARDIZEM CD) 360 MG 24 hr capsule, Take 1 capsule (360 mg total) by mouth daily., Disp: 30 capsule, Rfl: 6   diltiazem (CARDIZEM) 30 MG tablet, Take 1 tablet Daily for  rapid Heart Beat (Patient taking differently: Take  30 mg by mouth as needed (for HR over 130). Take 1 tablet Daily for  rapid Heart Beat), Disp: 90 tablet, Rfl: 1   fexofenadine (ALLEGRA ALLERGY) 180 MG tablet, Take 180 mg by mouth daily., Disp: , Rfl:    fluorometholone (  FML) 0.1 % ophthalmic suspension, Place 1 drop into both eyes as needed. As needed, Disp: , Rfl:    fluticasone (FLONASE) 50 MCG/ACT nasal spray, Place 1 spray into both nostrils 2 (two) times daily., Disp: , Rfl:    ipratropium (ATROVENT) 0.06 % nasal spray, SMARTSIG:2 Spray(s) Both Nares 1-3 Times Daily PRN, Disp: , Rfl:    Multiple Vitamin (MULTI VITAMIN DAILY PO), Take 1 tablet by mouth every morning., Disp: , Rfl:    omeprazole (PRILOSEC) 40 MG capsule, TAKE 1 CAPSULE BY MOUTH ONCE DAILY 30-60 MINUTES BEFORE MORNING MEAL FOR 8 WEEKS, Disp: , Rfl:    OVER THE COUNTER MEDICATION, Eye drop- 1 drop each eye as needed. Equate dry eye, Disp: , Rfl:    rosuvastatin (CRESTOR) 5 MG tablet, Take 5 mg by mouth daily., Disp: , Rfl:    Semaglutide,0.25 or 0.5MG /DOS, 2 MG/1.5ML SOPN, Inject 0.375 mg into the skin once a week., Disp: , Rfl:    valACYclovir (VALTREX) 500 MG tablet, Take 1 tablet Daily for Prevention Fever Blisters, Disp: 90 tablet, Rfl: 3   XARELTO 20 MG TABS tablet, TAKE 1 TABLET DAILY TO PREVENT BLOOD CLOTS, Disp: 90 tablet, Rfl: 0      Objective:   Vitals:   12/31/23 1543  BP: 130/80  Pulse: 67  Temp: 98.3 F (36.8 C)  TempSrc: Oral  SpO2: 98%  Weight: 146 lb (66.2 kg)  Height: 5\' 3"  (1.6 m)    Estimated body mass index is 25.86 kg/m as calculated from the following:   Height as of this encounter: 5\' 3"  (1.6 m).   Weight as of this encounter: 146 lb (66.2 kg).  @WEIGHTCHANGE @  [employer]   12/31/23 1543  Weight: 146 lb (66.2 kg)     Physical Exam   General: No distress. Looks well O2 at rest: no Cane present: no Sitting in wheel chair: no Frail: no Obese: no Neuro: Alert and Oriented x 3. GCS 15. Speech normal Psych: Pleasant Resp:   Barrel Chest - no.  Wheeze - no, Crackles - no, No overt respiratory distress CVS: Normal heart sounds. Murmurs - no Ext: Stigmata of Connective Tissue Disease - no HEENT: Normal upper airway. PEERL +. No post nasal drip. COUGHS CLEARS THROAST A LOT        Assessment:       ICD-10-CM   1. Chronic cough  R05.3 POCT EXHALED NITRIC OXIDE    Alpha-1 antitrypsin phenotype    Pulmonary function test    CBC with Differential    Perennial allergen profile IgE    Ambulatory referral to ENT    Alpha-1 antitrypsin phenotype    Ambulatory Referral to Neuro Rehab    CT MAXILLOFACIAL WO CONTRAST    2. Pulmonary emphysema, unspecified emphysema type (HCC)  J43.9 Alpha-1 antitrypsin phenotype    Pulmonary function test    CBC with Differential    Perennial allergen profile IgE    Ambulatory referral to ENT    Alpha-1 antitrypsin phenotype    Ambulatory Referral to Neuro Rehab    CT MAXILLOFACIAL WO CONTRAST    3. 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 Chronic sinus complaints  R09.89 CBC with Differential    Perennial allergen profile IgE    Ambulatory  referral to ENT    Alpha-1 antitrypsin phenotype    Ambulatory Referral to Neuro Rehab    CT MAXILLOFACIAL WO CONTRAST    5. Post-nasal drip  R09.82 CBC with Differential    Perennial allergen profile IgE    Ambulatory referral to ENT    Alpha-1 antitrypsin phenotype    Ambulatory Referral to Neuro Rehab    CT MAXILLOFACIAL WO CONTRAST    6. Stopped smoking with greater than 40 pack year history  Z87.891 Alpha-1 antitrypsin phenotype    Pulmonary function test    CBC with Differential    Perennial allergen profile IgE    Ambulatory referral to ENT    Alpha-1 antitrypsin phenotype    Ambulatory Referral to Neuro Rehab    CT MAXILLOFACIAL WO  CONTRAST         Plan:     Patient Instructions     ICD-10-CM   1. Stopped smoking with greater than 40 pack year history  Z87.891 Alpha-1 antitrypsin phenotype    Pulmonary function test    2. Chronic cough  R05.3 POCT EXHALED NITRIC OXIDE    Alpha-1 antitrypsin phenotype    Pulmonary function test    3. Pulmonary emphysema, unspecified emphysema type (HCC)  J43.9 Alpha-1 antitrypsin phenotype    Pulmonary function test    4. 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 Chronic sinus complaints  R09.89     6. Post-nasal drip  R09.82      Good to see you again after more than 3 years.  Your chronic cough continues.  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    Risk factors for this in  you are: Possible hiatal hernia, the mild emphysema, talking a lot, sinus drainage, previous history of perennial environmental allergies  PLAN - Take 2 days of voice rest and do not talk a whisper - Every time you have an urge to cough or clear throat drink water or suck on a sugarless lozenge -Gabapentin indicated but I am going to first check with pharmacy team -Refer ENT  Dr Irene Pap = Get CT scan of the sinus without contrast -Try samples of Stiolto  -Get blood work for alpha-1 antitrypsin phenotype and blood allergy profile  Follow-up - Return to see nurse practitioner in 6/8 weeks but after completing all of the above   = RSI cough score at follow-up   FOLLOWUP Return in about 7 weeks (around 02/18/2024) for with any of the APPS.    SIGNATURE    Dr. Kalman Shan, M.D., F.C.C.P,  Pulmonary and Critical Care Medicine Staff Physician, [facility] Center Director - Interstitial Lung Disease  Program  Pulmonary Fibrosis [facility] at [facility] [place], [place], [address]  Pager: [phone], If  no answer or between  15:00h - 7:00h: call [phone] Telephone: [phone]  4:23 PM 12/31/2023

## 2023-12-31 NOTE — Telephone Encounter (Signed)
   Kathryn Booker - devki, any contraindications for gabapentin?  THanks    SIGNATURE    Dr. Kalman Shan, M.D., F.C.C.P,  Pulmonary and Critical Care Medicine Staff Physician, [facility] Center Director - Interstitial Lung Disease  Program  Pulmonary Fibrosis [facility] at [facility] [place], [place], [address]   Pager: [phone], If no answer  -> Check AMION or Try [phone] Telephone (clinical office): [phone] Telephone (research): [phone]  4:23 PM 12/31/2023

## 2024-01-01 LAB — CBC WITH DIFFERENTIAL/PLATELET
Basophils Absolute: 0.1 10*3/uL (ref 0.0–0.1)
Basophils Relative: 1.2 % (ref 0.0–3.0)
Eosinophils Absolute: 0.3 10*3/uL (ref 0.0–0.7)
Eosinophils Relative: 3.3 % (ref 0.0–5.0)
HCT: 41.3 % (ref 36.0–46.0)
Hemoglobin: 13.9 g/dL (ref 12.0–15.0)
Lymphocytes Relative: 25 % (ref 12.0–46.0)
Lymphs Abs: 2.2 10*3/uL (ref 0.7–4.0)
MCHC: 33.7 g/dL (ref 30.0–36.0)
MCV: 99.6 fl (ref 78.0–100.0)
Monocytes Absolute: 0.9 10*3/uL (ref 0.1–1.0)
Monocytes Relative: 9.9 % (ref 3.0–12.0)
Neutro Abs: 5.3 10*3/uL (ref 1.4–7.7)
Neutrophils Relative %: 60.6 % (ref 43.0–77.0)
Platelets: 241 10*3/uL (ref 150.0–400.0)
RBC: 4.15 Mil/uL (ref 3.87–5.11)
RDW: 13.6 % (ref 11.5–15.5)
WBC: 8.8 10*3/uL (ref 4.0–10.5)

## 2024-01-01 NOTE — Telephone Encounter (Signed)
 Arnaldo Natal - let her know that I d/w Devki and no contrandications to do gabapentin for cough. IF she is on board then we will do   Take gabapentin 300mg  once daily x 7 days, then 300mg  twice daily x 7 days, then 300mg  three times daily to continue. If this makes you too sleepy or drowsy call us and we will cut your medication dosing down   If you cannot do the ordedr I wil do it

## 2024-01-01 NOTE — Telephone Encounter (Signed)
 Most recent renal function I could find was in Setp 2024 In [facility] tab but was wnl  No significant drug-drug interactions between gabapentin and current medication list in chart.  Chesley Mires, PharmD, MPH, BCPS, CPP Clinical Pharmacist (Rheumatology and Pulmonology)

## 2024-01-04 MED ORDER — GABAPENTIN 300 MG PO CAPS: ORAL_CAPSULE | ORAL | 0 refills | Status: DC

## 2024-01-04 NOTE — Telephone Encounter (Signed)
 Called pt and there was no answer-LMTCB

## 2024-01-04 NOTE — Telephone Encounter (Signed)
 PT ret your call. Please try again. [phone]

## 2024-01-04 NOTE — Telephone Encounter (Signed)
 I called and spoke with the pt and notified of response from Dr Marchelle Gearing  She verbalized understanding  She does want to start gabapentin and I have sent to preferred pharm  Nothing further needed

## 2024-01-06 LAB — ALPHA-1 ANTITRYPSIN PHENOTYPE: A-1 Antitrypsin, Ser: 151 mg/dL (ref 83–199)

## 2024-01-07 LAB — ALLERGEN PROFILE, PERENNIAL ALLERGEN IGE
Alternaria Alternata IgE: <0.1 kU/L
Aspergillus Fumigatus IgE: <0.1 kU/L
Aureobasidi Pullulans IgE: <0.1 kU/L
Candida Albicans IgE: <0.1 kU/L
Cat Dander IgE: 1.05 kU/L — AB
Chicken Feathers IgE: <0.1 kU/L
Cladosporium Herbarum IgE: <0.1 kU/L
Cow Dander IgE: 0.25 kU/L — AB
D Farinae IgE: 0.84 kU/L — AB
D Pteronyssinus IgE: 0.82 kU/L — AB
Dog Dander IgE: 2.88 kU/L — AB
Duck Feathers IgE: <0.1 kU/L
Goose Feathers IgE: <0.1 kU/L
Mouse Urine IgE: <0.1 kU/L
Mucor Racemosus IgE: <0.1 kU/L
Penicillium Chrysogen IgE: <0.1 kU/L
Phoma Betae IgE: <0.1 kU/L
Setomelanomma Rostrat: <0.1 kU/L
Stemphylium Herbarum IgE: <0.1 kU/L

## 2024-01-08 ENCOUNTER — Encounter (INDEPENDENT_AMBULATORY_CARE_PROVIDER_SITE_OTHER): Payer: Self-pay | Admitting: Otolaryngology

## 2024-01-18 ENCOUNTER — Encounter: Payer: Self-pay | Admitting: Internal Medicine

## 2024-01-21 NOTE — Telephone Encounter (Signed)
 I understand the frustration but since is a chronic issue with throat issues I recommend she see ENT and then we will go from there.

## 2024-01-22 ENCOUNTER — Ambulatory Visit
Admission: RE | Admit: 2024-01-22 | Discharge: 2024-01-22 | Disposition: A | Discharge status: Home / Self Care | Source: Ambulatory Visit | Attending: Internal Medicine | Admitting: Internal Medicine

## 2024-01-22 DIAGNOSIS — R0982 Postnasal drip: Secondary | ICD-10-CM

## 2024-01-22 DIAGNOSIS — J387 Other diseases of larynx: Secondary | ICD-10-CM

## 2024-01-22 DIAGNOSIS — J439 Emphysema, unspecified: Secondary | ICD-10-CM

## 2024-01-22 DIAGNOSIS — R0989 Other specified symptoms and signs involving the circulatory and respiratory systems: Secondary | ICD-10-CM

## 2024-01-22 DIAGNOSIS — J3489 Other specified disorders of nose and nasal sinuses: Secondary | ICD-10-CM | POA: Diagnosis not present

## 2024-01-22 DIAGNOSIS — R053 Chronic cough: Secondary | ICD-10-CM

## 2024-01-22 DIAGNOSIS — Z87891 Personal history of nicotine dependence: Secondary | ICD-10-CM

## 2024-01-27 DIAGNOSIS — M25561 Pain in right knee: Secondary | ICD-10-CM | POA: Diagnosis not present

## 2024-01-29 ENCOUNTER — Ambulatory Visit (INDEPENDENT_AMBULATORY_CARE_PROVIDER_SITE_OTHER): Payer: Medicare PPO

## 2024-01-29 DIAGNOSIS — I480 Paroxysmal atrial fibrillation: Secondary | ICD-10-CM

## 2024-01-29 LAB — CUP PACEART REMOTE DEVICE CHECK
Date Time Interrogation Session: 20250424230339
Implantable Pulse Generator Implant Date: 20210208

## 2024-02-02 NOTE — Progress Notes (Signed)
 [facility] Summary Report / Loop Recorder

## 2024-02-03 DIAGNOSIS — R3989 Other symptoms and signs involving the genitourinary system: Secondary | ICD-10-CM | POA: Diagnosis not present

## 2024-02-03 DIAGNOSIS — N390 Urinary tract infection, site not specified: Secondary | ICD-10-CM | POA: Diagnosis not present

## 2024-03-04 ENCOUNTER — Ambulatory Visit (INDEPENDENT_AMBULATORY_CARE_PROVIDER_SITE_OTHER): Payer: Medicare PPO

## 2024-03-04 DIAGNOSIS — I480 Paroxysmal atrial fibrillation: Secondary | ICD-10-CM

## 2024-03-04 LAB — CUP PACEART REMOTE DEVICE CHECK
Date Time Interrogation Session: 20250529231731
Implantable Pulse Generator Implant Date: 20210208

## 2024-03-06 ENCOUNTER — Ambulatory Visit: Payer: Self-pay | Admitting: Cardiology

## 2024-03-07 NOTE — Progress Notes (Signed)
 [facility] Summary Report / Loop Recorder

## 2024-03-18 ENCOUNTER — Encounter (INDEPENDENT_AMBULATORY_CARE_PROVIDER_SITE_OTHER): Payer: Self-pay | Admitting: Otolaryngology

## 2024-03-18 ENCOUNTER — Ambulatory Visit (INDEPENDENT_AMBULATORY_CARE_PROVIDER_SITE_OTHER): Admitting: Otolaryngology

## 2024-03-18 VITALS — BP 115/68 | HR 67 | Ht 63.0 in | Wt 141.0 lb

## 2024-03-18 DIAGNOSIS — R0989 Other specified symptoms and signs involving the circulatory and respiratory systems: Secondary | ICD-10-CM | POA: Diagnosis not present

## 2024-03-18 DIAGNOSIS — Z87891 Personal history of nicotine dependence: Secondary | ICD-10-CM | POA: Diagnosis not present

## 2024-03-18 DIAGNOSIS — K219 Gastro-esophageal reflux disease without esophagitis: Secondary | ICD-10-CM

## 2024-03-18 DIAGNOSIS — R0982 Postnasal drip: Secondary | ICD-10-CM | POA: Diagnosis not present

## 2024-03-18 DIAGNOSIS — R053 Chronic cough: Secondary | ICD-10-CM | POA: Diagnosis not present

## 2024-03-18 DIAGNOSIS — J342 Deviated nasal septum: Secondary | ICD-10-CM

## 2024-03-18 DIAGNOSIS — R09A2 Foreign body sensation, throat: Secondary | ICD-10-CM | POA: Diagnosis not present

## 2024-03-18 DIAGNOSIS — R0981 Nasal congestion: Secondary | ICD-10-CM | POA: Diagnosis not present

## 2024-03-18 MED ORDER — FAMOTIDINE 20 MG PO TABS: 20.0000 mg | ORAL_TABLET | Freq: Every day | ORAL | 0 refills | Status: DC

## 2024-03-18 MED ORDER — AZELASTINE HCL 0.1 % NA SOLN: 2.0000 | Freq: Two times a day (BID) | NASAL | 12 refills | Status: AC

## 2024-03-18 NOTE — Progress Notes (Signed)
 Dear Dr. Bertrum Booker, Here is my assessment for our mutual patient, Kathryn Booker. Thank you for allowing me the opportunity to care for your patient. Please do not hesitate to contact me should you have any other questions. Sincerely, Dr. Milon Booker  Otolaryngology Clinic Note Referring provider: Dr. Bertrum Booker HPI:  Kathryn Booker is a 73 y.o. female kindly referred by Dr. Bertrum Booker for evaluation of throat clearing, cough,  and PND  Initial visit (03/2024):  She reports that she has had always had sinus issues - mostly complains of post nasal drip, 24/7 - can't cough it up, can't blow it out. No facial pain/pressure, no discolored drainage, sense of smell without issue. Does not get abx/steroids. Reports 2-3 sinus infections/year - fever, discolored drainage, abx --- sx worse then. Quite frustrated by this. She does report allergy issues chronically with typical AR symptoms -- prior AIT in her 73s which helped. Also reports throat clearing and cough -- precipitated by drainage. No voice or swallowing issues. Cough does not bother her, always non-productive. Prescribed gabapentin  but never took it. No PNA, no ear pain, no neck masses Her issues have been ongoing for several years. No salty or metallic taste in mouth, no headache, no light sensitivity  She is currently on PO anthistamine, flonase  (daily), atrovent  (twice a day). Prior also tried singulair  and mucinex . Did not help much. Never tried rinses.   She reports some intermittent GERD - heartburn, belching. She is on omeprazole (AM) - 20mg .  RSI: 22  Never tried speech therapy.   H&N Surgery: no Personal or FHx of bleeding dz or anesthesia difficulty: no  AP/AC: Xarelto   Tobacco: prior (70 pack year), quit.   PMHx: OSA on CPAP, Asthma, A-fib, HTN, Hypothyroidism  Independent Review of Additional Tests or Records:  Dr. Bertrum Booker (Pulm) 01/18/2024 and 12/31/2023: noted post nasal drip, sticking and choking. Tried flonase ,  atrovent , zyrtec but does not help. Dx: PND; Rx: ref to ENT On 12/31/2023: noted chronic cough,chronic sinus drainage, PND, and deviated septum; SOB worse; rritable larynx syndrome, constant throat clearing. Dx: Irritable larynx/neurogenic cough; RSI 22; Rx: gabapentin , CT, ref to ENT.  Labs reviewed:  CBC w/diff 12/31/2023: WBC 8.8, Eos 300 IgE 09/04/2020: 20 ANA 09/04/2020: pos  RAST (12/31/2023): pos as below  CT Face 01/22/2024 independently interpreted: noted small retention cyst(?) inferior frontal on right (interfrontal cell)?, otherwise paranasal sinuses clear; do not note any bony erosion or skull base erosion -- do not note in limited sinus CT prior, but cuts are very thick Prior saw Dr. Darlin Booker - unable to review his notes.  PMH/Meds/All/SocHx/FamHx/ROS:   Past Medical History:  Diagnosis Date   Allergies    Anxiety    Asthma    COPD (chronic obstructive pulmonary disease) (HCC)    Coronary artery calcification seen on CT scan    Depression    Fatigue    Former tobacco use    Hyperlipidemia    Paroxysmal atrial fibrillation (HCC)    Pre-diabetes    RBBB (right bundle branch block with left anterior fascicular block)    Scoliosis    Serum calcium elevated    Thyroid  disease    questionable per patient     Past Surgical History:  Procedure Laterality Date   APPENDECTOMY     BREAST EXCISIONAL BIOPSY Right 2017   benign   implantable loop recorder placement  11/14/2019   Medtronic Reveal Linq model LNQ 2 (SN [id number]) implantable loop recorder    LAPROSCOPIC  X 2 IN EARLY 30-40'S   OVARIAN CYST REMOVAL     RADIOACTIVE SEED GUIDED EXCISIONAL BREAST BIOPSY Right 05/15/2016   Procedure: RIGHT RADIOACTIVE SEED GUIDED EXCISIONAL BREAST BIOPSY;  Surgeon: Kathryn Harry, MD;  Location:  SURGERY CENTER;  Service: General;  Laterality: Right;  RIGHT RADIOACTIVE SEED GUIDED EXCISIONAL BREAST BIOPSY   TONSILECTOMY, ADENOIDECTOMY, BILATERAL MYRINGOTOMY AND TUBES       Family History  Problem Relation Age of Onset   Heart failure Mother    Hypertension Mother    Hyperlipidemia Mother    Heart disease Mother    Liver disease Father    Alcohol abuse Father    Colon cancer Neg Hx    Esophageal cancer Neg Hx    Stomach cancer Neg Hx      Social Connections: Not on file      Current Outpatient Medications:    albuterol  (PROVENTIL  HFA;VENTOLIN  HFA) 108 (90 Base) MCG/ACT inhaler, Use 2 inhalations 15 minutes apart every 4 hours to rescue Asthma, Disp: 3 Inhaler, Rfl: 3   azelastine  (ASTELIN ) 0.1 % nasal spray, Place 2 sprays into both nostrils 2 (two) times daily. Use in each nostril as directed, Disp: 30 mL, Rfl: 12   buPROPion  (WELLBUTRIN  XL) 300 MG 24 hr tablet, Take 1 tablet every Morning or Mood, Focus & Concentration, Disp: 90 tablet, Rfl: 1   cholecalciferol (VITAMIN D3) 25 MCG (1000 UNIT) tablet, Take 1,000 Units by mouth 2 (two) times a week., Disp: , Rfl:    citalopram  (CELEXA ) 20 MG tablet, Take 20 mg by mouth daily., Disp: , Rfl:    cyclobenzaprine  (FLEXERIL ) 10 MG tablet, Take 10 mg by mouth at bedtime as needed. , Disp: , Rfl:    diltiazem  (CARDIZEM  CD) 360 MG 24 hr capsule, Take 1 capsule (360 mg total) by mouth daily., Disp: 30 capsule, Rfl: 6   diltiazem  (CARDIZEM ) 30 MG tablet, Take 1 tablet Daily for  rapid Heart Beat (Patient taking differently: Take 30 mg by mouth as needed (for HR over 130). Take 1 tablet Daily for  rapid Heart Beat), Disp: 90 tablet, Rfl: 1   famotidine (PEPCID) 20 MG tablet, Take 1 tablet (20 mg total) by mouth at bedtime., Disp: 30 tablet, Rfl: 0   fexofenadine (ALLEGRA ALLERGY) 180 MG tablet, Take 180 mg by mouth daily., Disp: , Rfl:    fluorometholone (FML) 0.1 % ophthalmic suspension, Place 1 drop into both eyes as needed. As needed, Disp: , Rfl:    fluticasone  (FLONASE ) 50 MCG/ACT nasal spray, Place 1 spray into both nostrils 2 (two) times daily., Disp: , Rfl:    ipratropium (ATROVENT ) 0.06 % nasal spray,  SMARTSIG:2 Spray(s) Both Nares 1-3 Times Daily PRN, Disp: , Rfl:    Multiple Vitamin (MULTI VITAMIN DAILY PO), Take 1 tablet by mouth every morning., Disp: , Rfl:    omeprazole (PRILOSEC) 40 MG capsule, TAKE 1 CAPSULE BY MOUTH ONCE DAILY 30-60 MINUTES BEFORE MORNING MEAL FOR 8 WEEKS (Patient taking differently: 20 mg.), Disp: , Rfl:    OVER THE COUNTER MEDICATION, Eye drop- 1 drop each eye as needed. Equate dry eye, Disp: , Rfl:    rosuvastatin (CRESTOR) 5 MG tablet, Take 5 mg by mouth daily., Disp: , Rfl:    Semaglutide,0.25 or 0.5MG /DOS, 2 MG/1.5ML SOPN, Inject 0.375 mg into the skin once a week., Disp: , Rfl:    valACYclovir  (VALTREX ) 500 MG tablet, Take 1 tablet Daily for Prevention Fever Blisters, Disp: 90 tablet, Rfl: 3  XARELTO  20 MG TABS tablet, TAKE 1 TABLET DAILY TO PREVENT BLOOD CLOTS, Disp: 90 tablet, Rfl: 0   gabapentin  (NEURONTIN ) 300 MG capsule, 1 daily x 7 days, 1 twice daily x 7 days, 1 three x daily thereafter (Patient not taking: Reported on 03/18/2024), Disp: 63 capsule, Rfl: 0   Physical Exam:   BP 115/68 (BP Location: Left Arm, Patient Position: Sitting, Cuff Size: Normal)   Pulse 67   Ht 5' 3 (1.6 m)   Wt 141 lb (64 kg)   SpO2 95%   BMI 24.98 kg/m   Salient findings:  CN II-XII intact  Bilateral EAC clear and TM intact with well pneumatized middle ear spaces Anterior rhinoscopy: Septum mild dev left; bilateral inferior turbinates without significant hypertrophy; Nasal endoscopy was indicated to better evaluate the nose and paranasal sinuses, given the patient's history and exam findings, and is detailed below. No lesions of oral cavity/oropharynx; No obviously palpable neck masses/lymphadenopathy/thyromegaly No respiratory distress or stridor; voice quality class 1.5; TFL was indicated to better evaluate the proximal airway, given the patient's history and exam findings, and is detailed below.   Seprately Identifiable Procedures:  Prior to initiating any  procedures, risks/benefits/alternatives were explained to the patient and verbal consent obtained. Procedure Note Pre-procedure diagnosis: Chronic cough, throat clearing, globus sensation Post-procedure diagnosis: Same Procedure: Transnasal Fiberoptic Laryngoscopy, CPT 31575 - Mod 25 Indication: see above Complications: None apparent EBL: 0 mL  The procedure was undertaken to further evaluate the patient's complaint above, with mirror exam inadequate for appropriate examination due to gag reflex and poor patient tolerance  Procedure:  Patient was identified as correct patient. Verbal consent was obtained. The nose was sprayed with oxymetazoline and 4% lidocaine . The The flexible laryngoscope was passed through the nose to view the nasal cavity, pharynx (oropharynx, hypopharynx) and larynx.  The larynx was examined at rest and during multiple phonatory tasks. Documentation was obtained and reviewed with patient. The scope was removed. The patient tolerated the procedure well.  Findings: The nasal cavity and nasopharynx did not reveal any masses or lesions, mucosa appeared to be without obvious lesions. The tongue base, pharyngeal walls, piriform sinuses, vallecula, epiglottis and postcricoid region are normal in appearance. No significant retained secretions. The visualized portion of the subglottis and proximal trachea is widely patent. The vocal folds are mobile bilaterally. There are no lesions on the free edge of the vocal folds nor elsewhere in the larynx worrisome for malignancy.      PROCEDURE: Bilateral Diagnostic Rigid Nasal Endoscopy Pre-procedure diagnosis: Concern for nasal lesion, post nasal drip Post-procedure diagnosis: same Indication: See pre-procedure diagnosis and physical exam above Complications: None apparent EBL: 0 mL Anesthesia: Lidocaine  4% and topical decongestant was topically sprayed in each nasal cavity  Description of Procedure:  Patient was identified. A rigid  30 degree endoscope was utilized to evaluate the sinonasal cavities, mucosa, sinus ostia and turbinates and septum.  Overall, signs of mucosal inflammation are not noted.  Also noted are smooth lesion medial to right MT attachment to skull base - no CSF leak appreciated.  No mucopurulence, polyps, or masses noted.   Right Middle meatus: clear Right SE Recess: clear Left MM: clear Left SE Recess: clear    CPT CODE -- 40981 - Mod 25  Electronically signed by: Evelina Hippo, MD 03/18/2024 2:41 PM   Impression & Plans:  Kathryn Booker is a 73 y.o. female with:  1. Nasal congestion   2. Nasal septal deviation   3. Post-nasal  drip   4. Chronic cough   5. Chronic throat clearing   6. Globus sensation   7. Laryngopharyngeal reflux (LPR)    We discussed her findings on TFL and Endo. Discussed PND and LPR contributions to cough and throat clearing. We discussed options for treatment of PND and LPR as well as mgmt of her chronic cough (most likely UACS/Neurogenic) with gabapentin  and voice therapy Patient declined voice therapy or gabapentin  (due to side effects) She would like to try medical mgmt for PND and LPR: start flonase , atrovent , astelin  BID; will add Pepcid to PPI and add reflux gourmet For nasal lesion, d/w pt re: opt - opted to observe given no signs of CSF leak - f/u 3 months; can consider Gabapentin  and voice Rx at that point.   See below regarding exact medications prescribed this encounter including dosages and route: Meds ordered this encounter  Medications   famotidine (PEPCID) 20 MG tablet    Sig: Take 1 tablet (20 mg total) by mouth at bedtime.    Dispense:  30 tablet    Refill:  0   azelastine  (ASTELIN ) 0.1 % nasal spray    Sig: Place 2 sprays into both nostrils 2 (two) times daily. Use in each nostril as directed    Dispense:  30 mL    Refill:  12      Thank you for allowing me the opportunity to care for your patient. Please do not hesitate to contact me should  you have any other questions.  Sincerely, Kathryn Aloe, MD Otolaryngologist (ENT), [facility] Phone: [phone] Fax: [phone]  03/18/2024, 2:41 PM   MDM:  Level 4 - 99204 Complexity/Problems addressed: mod - chronic problems, worsening Data complexity: mod - independent review of notes, labs; independent interpretation of testing - Morbidity: mod   - Drug prescribed or managed: y

## 2024-03-18 NOTE — Patient Instructions (Addendum)
 Take omeprazole in morning, take pepcid at night before bed Reflux gourmet --- buy on amazon. Take it after dinner Use two sprays of flonase  in each nostril twice per day  right after, use astelin  spray two sprays each nostril twice per day. Can also continue atrovent .  Take it back to back.  Chew gum or eat candy Stay hydrated - take a sip of water every time you want to cough or clear your throat.

## 2024-04-04 ENCOUNTER — Ambulatory Visit: Payer: Self-pay | Admitting: Cardiology

## 2024-04-04 ENCOUNTER — Ambulatory Visit (INDEPENDENT_AMBULATORY_CARE_PROVIDER_SITE_OTHER)

## 2024-04-04 DIAGNOSIS — I480 Paroxysmal atrial fibrillation: Secondary | ICD-10-CM | POA: Diagnosis not present

## 2024-04-04 LAB — CUP PACEART REMOTE DEVICE CHECK
Date Time Interrogation Session: 20250629230340
Implantable Pulse Generator Implant Date: 20210208

## 2024-04-09 ENCOUNTER — Other Ambulatory Visit (INDEPENDENT_AMBULATORY_CARE_PROVIDER_SITE_OTHER): Payer: Self-pay | Admitting: Otolaryngology

## 2024-04-10 ENCOUNTER — Ambulatory Visit: Payer: Self-pay | Admitting: Internal Medicine

## 2024-04-10 NOTE — Progress Notes (Signed)
 Kathryn Booker, SAw for chronic cough. So far not followed up insce march. Was suspposed to see APP I Think. RAST allergy test is positive for dust mite, dog, cow etc.

## 2024-04-11 ENCOUNTER — Ambulatory Visit: Payer: Medicare PPO

## 2024-04-19 NOTE — Progress Notes (Signed)
 [facility] Summary Report / Loop Recorder

## 2024-05-05 ENCOUNTER — Ambulatory Visit (INDEPENDENT_AMBULATORY_CARE_PROVIDER_SITE_OTHER)

## 2024-05-05 DIAGNOSIS — I480 Paroxysmal atrial fibrillation: Secondary | ICD-10-CM | POA: Diagnosis not present

## 2024-05-05 LAB — CUP PACEART REMOTE DEVICE CHECK
Date Time Interrogation Session: 20250730230758
Implantable Pulse Generator Implant Date: 20210208

## 2024-05-05 NOTE — Progress Notes (Signed)
 [facility] Summary Report / Loop Recorder

## 2024-05-06 ENCOUNTER — Ambulatory Visit: Payer: Self-pay | Admitting: Cardiology

## 2024-05-13 ENCOUNTER — Ambulatory Visit: Payer: Medicare PPO

## 2024-05-30 DIAGNOSIS — M25561 Pain in right knee: Secondary | ICD-10-CM | POA: Diagnosis not present

## 2024-06-06 ENCOUNTER — Ambulatory Visit (INDEPENDENT_AMBULATORY_CARE_PROVIDER_SITE_OTHER)

## 2024-06-06 DIAGNOSIS — I480 Paroxysmal atrial fibrillation: Secondary | ICD-10-CM | POA: Diagnosis not present

## 2024-06-08 LAB — CUP PACEART REMOTE DEVICE CHECK
Date Time Interrogation Session: 20250830230329
Implantable Pulse Generator Implant Date: 20210208

## 2024-06-09 ENCOUNTER — Ambulatory Visit: Payer: Self-pay | Admitting: Cardiology

## 2024-06-14 DIAGNOSIS — M810 Age-related osteoporosis without current pathological fracture: Secondary | ICD-10-CM | POA: Diagnosis not present

## 2024-06-14 DIAGNOSIS — E1159 Type 2 diabetes mellitus with other circulatory complications: Secondary | ICD-10-CM | POA: Diagnosis not present

## 2024-06-14 DIAGNOSIS — I2510 Atherosclerotic heart disease of native coronary artery without angina pectoris: Secondary | ICD-10-CM | POA: Diagnosis not present

## 2024-06-14 LAB — LAB REPORT - SCANNED
A1c: 5.8
Albumin, Urine POC: 38.6
Albumin/Creatinine Ratio, Urine, POC: 13
Creatinine, POC: 296 mg/dL
EGFR: 75

## 2024-06-14 NOTE — Progress Notes (Signed)
 Remote Loop Recorder Transmission

## 2024-06-17 ENCOUNTER — Ambulatory Visit: Payer: Medicare PPO

## 2024-06-20 DIAGNOSIS — J432 Centrilobular emphysema: Secondary | ICD-10-CM | POA: Diagnosis not present

## 2024-06-20 DIAGNOSIS — J302 Other seasonal allergic rhinitis: Secondary | ICD-10-CM | POA: Diagnosis not present

## 2024-06-20 DIAGNOSIS — F339 Major depressive disorder, recurrent, unspecified: Secondary | ICD-10-CM | POA: Diagnosis not present

## 2024-06-20 DIAGNOSIS — Z0000 Encounter for general adult medical examination without abnormal findings: Secondary | ICD-10-CM | POA: Diagnosis not present

## 2024-06-20 DIAGNOSIS — G4733 Obstructive sleep apnea (adult) (pediatric): Secondary | ICD-10-CM | POA: Diagnosis not present

## 2024-06-20 DIAGNOSIS — I480 Paroxysmal atrial fibrillation: Secondary | ICD-10-CM | POA: Diagnosis not present

## 2024-06-20 DIAGNOSIS — Z23 Encounter for immunization: Secondary | ICD-10-CM | POA: Diagnosis not present

## 2024-06-20 DIAGNOSIS — I2510 Atherosclerotic heart disease of native coronary artery without angina pectoris: Secondary | ICD-10-CM | POA: Diagnosis not present

## 2024-06-20 DIAGNOSIS — M810 Age-related osteoporosis without current pathological fracture: Secondary | ICD-10-CM | POA: Diagnosis not present

## 2024-06-28 ENCOUNTER — Ambulatory Visit (INDEPENDENT_AMBULATORY_CARE_PROVIDER_SITE_OTHER): Admitting: Otolaryngology

## 2024-06-28 DIAGNOSIS — G4733 Obstructive sleep apnea (adult) (pediatric): Secondary | ICD-10-CM | POA: Diagnosis not present

## 2024-06-28 DIAGNOSIS — Z1151 Encounter for screening for human papillomavirus (HPV): Secondary | ICD-10-CM | POA: Diagnosis not present

## 2024-06-28 DIAGNOSIS — Z6826 Body mass index (BMI) 26.0-26.9, adult: Secondary | ICD-10-CM | POA: Diagnosis not present

## 2024-06-28 DIAGNOSIS — F32A Depression, unspecified: Secondary | ICD-10-CM | POA: Diagnosis not present

## 2024-06-28 DIAGNOSIS — F419 Anxiety disorder, unspecified: Secondary | ICD-10-CM | POA: Diagnosis not present

## 2024-06-28 DIAGNOSIS — I480 Paroxysmal atrial fibrillation: Secondary | ICD-10-CM | POA: Diagnosis not present

## 2024-06-28 DIAGNOSIS — R351 Nocturia: Secondary | ICD-10-CM | POA: Diagnosis not present

## 2024-06-28 DIAGNOSIS — Z124 Encounter for screening for malignant neoplasm of cervix: Secondary | ICD-10-CM | POA: Diagnosis not present

## 2024-07-04 ENCOUNTER — Other Ambulatory Visit (HOSPITAL_COMMUNITY): Payer: Self-pay | Admitting: Internal Medicine

## 2024-07-05 ENCOUNTER — Ambulatory Visit: Admitting: Student

## 2024-07-05 DIAGNOSIS — G4733 Obstructive sleep apnea (adult) (pediatric): Secondary | ICD-10-CM | POA: Diagnosis not present

## 2024-07-05 NOTE — Progress Notes (Signed)
 Remote Loop Recorder Transmission

## 2024-07-07 ENCOUNTER — Ambulatory Visit

## 2024-07-07 DIAGNOSIS — I480 Paroxysmal atrial fibrillation: Secondary | ICD-10-CM | POA: Diagnosis not present

## 2024-07-07 LAB — CUP PACEART REMOTE DEVICE CHECK
Date Time Interrogation Session: 20251001230326
Implantable Pulse Generator Implant Date: 20210208

## 2024-07-08 NOTE — Progress Notes (Signed)
 Remote Loop Recorder Transmission

## 2024-07-11 ENCOUNTER — Ambulatory Visit: Payer: Self-pay | Admitting: Cardiology

## 2024-07-13 ENCOUNTER — Encounter: Admitting: Physician Assistant

## 2024-07-14 ENCOUNTER — Encounter: Payer: Self-pay | Admitting: Cardiology

## 2024-07-14 ENCOUNTER — Ambulatory Visit: Attending: Cardiology | Admitting: Cardiology

## 2024-07-14 VITALS — BP 126/74 | HR 63 | Ht 63.0 in | Wt 145.0 lb

## 2024-07-14 DIAGNOSIS — D6869 Other thrombophilia: Secondary | ICD-10-CM | POA: Diagnosis not present

## 2024-07-14 DIAGNOSIS — I493 Ventricular premature depolarization: Secondary | ICD-10-CM | POA: Diagnosis not present

## 2024-07-14 DIAGNOSIS — I480 Paroxysmal atrial fibrillation: Secondary | ICD-10-CM

## 2024-07-14 NOTE — Progress Notes (Signed)
   Electrophysiology Office Note:   Date:  07/14/2024  ID:  Quetzalli Snead Wiltse, DOB 1951-07-29, MRN [id number]  Primary Cardiologist: None Primary Heart Failure: None Electrophysiologist: Stpehanie Montroy Gladis Norton, MD      History of Present Illness:   Kathryn Booker is a 73 y.o. female with h/o COPD, coronary artery disease, atrial fibrillation, sleep apnea seen today for routine electrophysiology followup.   Since last being seen in our clinic the patient reports doing overall well.  She has no chest pain or shortness of breath.  She does continue to have episodes of atrial fibrillation that gives her palpitations, fatigue and shortness of breath.  She has between a 5 to 12% burden based on her ILR.  She is potentially interested in rhythm control with ablation in the future, but for now is happy with her control.  she denies chest pain, palpitations, dyspnea, PND, orthopnea, nausea, vomiting, dizziness, syncope, edema, weight gain, or early satiety.   Review of systems complete and found to be negative unless listed in HPI.   Device History: Medtronic loop recorder implanted 11/14/2019 for palpitations  EP Information / Studies Reviewed:    EKG is ordered today. Personal review as below.  EKG Interpretation Date/Time:  Thursday July 14 2024 15:42:05 EDT Ventricular Rate:  63 PR Interval:  124 QRS Duration:  142 QT Interval:  436 QTC Calculation: 446 R Axis:   -69  Text Interpretation: Sinus rhythm with Premature supraventricular complexes Left axis deviation Right bundle branch block When compared with ECG of 08-Dec-2023 14:04, No significant change was found Confirmed by Ceonna Frazzini ([id number]) on 07/14/2024 3:47:28 PM     Risk Assessment/Calculations:    CHA2DS2-VASc Score = 3   This indicates a 3.2% annual risk of stroke. The patient's score is based upon: CHF History: 0 HTN History: 0 Diabetes History: 0 Stroke History: 0 Vascular Disease History: 1 Age Score:  1 Gender Score: 1           Physical Exam:   VS:  BP 126/74   Pulse 63   Ht 5' 3 (1.6 m)   Wt 145 lb (65.8 kg)   SpO2 98%   BMI 25.69 kg/m    Wt Readings from Last 3 Encounters:  07/14/24 145 lb (65.8 kg)  03/18/24 141 lb (64 kg)  12/31/23 146 lb (66.2 kg)     GEN: Well nourished, well developed in no acute distress NECK: No JVD; No carotid bruits CARDIAC: Regular rate and rhythm, no murmurs, rubs, gallops RESPIRATORY:  Clear to auscultation without rales, wheezing or rhonchi  ABDOMEN: Soft, non-tender, non-distended EXTREMITIES:  No edema; No deformity   ILR Interrogation- reviewed in detail today,  See PACEART report  ASSESSMENT AND PLAN:    Atrial fibrillation s/p Medtronic Loop recorder Normal device function See Pace Art report No changes today We discussed the possibility of atrial fibrillation ablation.  I told her that she would be a good candidate in the future.  She Exa Bomba consider this further and let us  know if she wishes to proceed.  2.  PVCs: 2.6% burden on ILR.  3.  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  Obstructive sleep apnea: CPAP compliance encouraged      Follow up with Afib Clinic in 6 months  Signed, Krysteena Stalker Gladis Norton, MD

## 2024-07-22 ENCOUNTER — Ambulatory Visit: Payer: Medicare PPO

## 2024-08-01 DIAGNOSIS — I480 Paroxysmal atrial fibrillation: Secondary | ICD-10-CM | POA: Diagnosis not present

## 2024-08-01 DIAGNOSIS — G4733 Obstructive sleep apnea (adult) (pediatric): Secondary | ICD-10-CM | POA: Diagnosis not present

## 2024-08-01 DIAGNOSIS — R351 Nocturia: Secondary | ICD-10-CM | POA: Diagnosis not present

## 2024-08-01 DIAGNOSIS — F32A Depression, unspecified: Secondary | ICD-10-CM | POA: Diagnosis not present

## 2024-08-01 DIAGNOSIS — F419 Anxiety disorder, unspecified: Secondary | ICD-10-CM | POA: Diagnosis not present

## 2024-08-08 ENCOUNTER — Ambulatory Visit

## 2024-08-08 ENCOUNTER — Other Ambulatory Visit: Payer: Self-pay | Admitting: Internal Medicine

## 2024-08-08 DIAGNOSIS — Z1231 Encounter for screening mammogram for malignant neoplasm of breast: Secondary | ICD-10-CM

## 2024-08-08 DIAGNOSIS — I480 Paroxysmal atrial fibrillation: Secondary | ICD-10-CM | POA: Diagnosis not present

## 2024-08-08 LAB — CUP PACEART REMOTE DEVICE CHECK
Date Time Interrogation Session: 20251102230117
Implantable Pulse Generator Implant Date: 20210208

## 2024-08-09 ENCOUNTER — Ambulatory Visit: Payer: Self-pay | Admitting: Cardiology

## 2024-08-11 NOTE — Progress Notes (Signed)
 Remote Loop Recorder Transmission

## 2024-08-26 ENCOUNTER — Ambulatory Visit: Payer: Medicare PPO

## 2024-08-29 ENCOUNTER — Encounter: Payer: Self-pay | Admitting: Cardiology

## 2024-09-05 ENCOUNTER — Ambulatory Visit: Admission: RE | Admit: 2024-09-05 | Discharge: 2024-09-05 | Disposition: A | Source: Ambulatory Visit

## 2024-09-05 DIAGNOSIS — Z1231 Encounter for screening mammogram for malignant neoplasm of breast: Secondary | ICD-10-CM

## 2024-09-08 ENCOUNTER — Ambulatory Visit: Payer: Self-pay | Admitting: Cardiology

## 2024-09-08 ENCOUNTER — Ambulatory Visit

## 2024-09-08 DIAGNOSIS — I480 Paroxysmal atrial fibrillation: Secondary | ICD-10-CM | POA: Diagnosis not present

## 2024-09-08 LAB — CUP PACEART REMOTE DEVICE CHECK
Date Time Interrogation Session: 20251203230207
Implantable Pulse Generator Implant Date: 20210208

## 2024-09-09 NOTE — Progress Notes (Signed)
 Remote Loop Recorder Transmission

## 2024-09-10 ENCOUNTER — Other Ambulatory Visit: Payer: Self-pay | Admitting: Medical Genetics

## 2024-09-30 ENCOUNTER — Ambulatory Visit: Payer: Medicare PPO

## 2024-10-08 ENCOUNTER — Other Ambulatory Visit (HOSPITAL_COMMUNITY): Payer: Self-pay | Admitting: Cardiology

## 2024-10-09 ENCOUNTER — Ambulatory Visit

## 2024-10-09 DIAGNOSIS — I480 Paroxysmal atrial fibrillation: Secondary | ICD-10-CM

## 2024-10-10 LAB — CUP PACEART REMOTE DEVICE CHECK
Date Time Interrogation Session: 20260103230156
Implantable Pulse Generator Implant Date: 20210208

## 2024-10-11 ENCOUNTER — Ambulatory Visit: Payer: Self-pay | Admitting: Cardiology

## 2024-10-13 ENCOUNTER — Encounter: Payer: Self-pay | Admitting: Acute Care

## 2024-10-13 NOTE — Progress Notes (Signed)
 Remote Loop Recorder Transmission

## 2024-10-17 ENCOUNTER — Telehealth: Payer: Self-pay

## 2024-10-17 DIAGNOSIS — Z9189 Other specified personal risk factors, not elsewhere classified: Secondary | ICD-10-CM

## 2024-10-17 NOTE — Telephone Encounter (Signed)
 MDT, LINQ2 @ RRT : 10/16/24 - implanted for AF, known PAF on OAC.     ILR @ RRT   ILR reached RRT:  Patient called, discussed options to leave device in or explanted.  Patient would like to   Marked I in Paceart:  Enter note in Paceart: Canceled future remotes: Discontinued from website: Entered in Speciality Comments:  Advised if further questions arise to please call the device clinic at [phone].

## 2024-10-18 NOTE — Telephone Encounter (Signed)
 Unable to speak w/ patient regarding ILR reaching RRT. Voicemail left requesting call back to the device clinic.   Will continue to monitor and update accordingly.

## 2024-10-19 ENCOUNTER — Inpatient Hospital Stay
Admission: RE | Admit: 2024-10-19 | Discharge: 2024-10-19 | Disposition: A | Source: Ambulatory Visit | Attending: Internal Medicine | Admitting: Internal Medicine

## 2024-10-19 DIAGNOSIS — Z122 Encounter for screening for malignant neoplasm of respiratory organs: Secondary | ICD-10-CM

## 2024-10-19 DIAGNOSIS — Z87891 Personal history of nicotine dependence: Secondary | ICD-10-CM

## 2024-10-19 NOTE — Telephone Encounter (Signed)
 Per Dr. Camnitz-ok to order calcium CT for Pt.

## 2024-10-19 NOTE — Telephone Encounter (Signed)
 Call back received from Pt.  Advised her loop was RRT.  Pt would like to know if Dr. Inocencio recommends replacing the loop.  Per Dr. Inocencio if she is not planning on going forward with Afib ablation at this time then he would recommend changing out the loop.  Pt in agreement.  Advised would send message to scheduler to set up explant/implant follow up appointment with Dr. Inocencio.  Pt also asking if she can have a calcium CT score with her loop recorder.  Discussed with CT team.  OK to have test with a loop.  Pt advised.  She would like to have this test ordered.  Advised would ask Dr. Inocencio and order.   ILR reached RRT: 10/16/2024 Patient called, discussed options to leave device in or explanted.  Patient would like to replace loop recorder.  Marked I in Paceart: done Enter note in Paceart:  done Canceled future remotes:  done Discontinued from website:  done Entered in Speciality Comments:  done  Advised if further questions arise to please call the device clinic at [phone].

## 2024-10-19 NOTE — Telephone Encounter (Signed)
 Attempted to contact Pt.  Left VM.  Also sent MyChart message.

## 2024-10-21 NOTE — Telephone Encounter (Signed)
 Order placed for Calcium CT

## 2024-10-31 ENCOUNTER — Telehealth: Payer: Self-pay

## 2024-10-31 ENCOUNTER — Other Ambulatory Visit: Payer: Self-pay | Admitting: Medical Genetics

## 2024-10-31 ENCOUNTER — Other Ambulatory Visit: Payer: Self-pay

## 2024-10-31 DIAGNOSIS — Z122 Encounter for screening for malignant neoplasm of respiratory organs: Secondary | ICD-10-CM

## 2024-10-31 DIAGNOSIS — Z87891 Personal history of nicotine dependence: Secondary | ICD-10-CM

## 2024-10-31 DIAGNOSIS — Z006 Encounter for examination for normal comparison and control in clinical research program: Secondary | ICD-10-CM

## 2024-10-31 NOTE — Telephone Encounter (Signed)
 Spoke with patient and reviewed recent Lung CT results. She will complete an annual Lung CT again next year. Order placed. Pt will follow up with Dr. Latisha to discuss possible new 12 mm nodule in right breast. Results and plan to PCP.   Dr. Latisha, pt requested results be sent to your office for follow up. Could you please review results and advise patient on next steps to address nodule in right breast? A copy of the results were sent to your office for review per patient request.   Isaiah, RN  IMPRESSION: 1. Lung-RADS 1, negative. Continue annual screening with low-dose chest CT without contrast in 12 months. 2. Possible new 12 mm nodule in the subareolar right breast. Screening mammogram 09/05/2024. Please correlate clinically. 3. Hepatic steatosis. 4. Aortic atherosclerosis (ICD10-I70.0). Coronary artery calcification. 5.  Emphysema (ICD10-J43.9).

## 2024-11-01 ENCOUNTER — Other Ambulatory Visit: Payer: Self-pay

## 2024-11-03 ENCOUNTER — Telehealth (INDEPENDENT_AMBULATORY_CARE_PROVIDER_SITE_OTHER): Payer: Self-pay | Admitting: Otolaryngology

## 2024-11-03 NOTE — Telephone Encounter (Signed)
 11/03/24 Received referral for current patient of Dr. Tobie. Called patient, left a message to call and schedule appointment for new problem.

## 2024-11-04 NOTE — Telephone Encounter (Signed)
 Patient called back and left message stating she does not want to schedule at this time and will call back to schedule when she is ready.

## 2024-11-09 ENCOUNTER — Ambulatory Visit

## 2024-11-10 ENCOUNTER — Other Ambulatory Visit: Payer: Self-pay | Admitting: Obstetrics and Gynecology

## 2024-11-10 DIAGNOSIS — N6341 Unspecified lump in right breast, subareolar: Secondary | ICD-10-CM

## 2024-11-21 ENCOUNTER — Other Ambulatory Visit

## 2024-11-21 ENCOUNTER — Encounter

## 2024-11-22 ENCOUNTER — Ambulatory Visit (HOSPITAL_COMMUNITY): Payer: Self-pay

## 2024-12-10 ENCOUNTER — Ambulatory Visit

## 2025-01-09 ENCOUNTER — Ambulatory Visit: Admitting: Cardiology
